# Patient Record
Sex: Female | Born: 1995 | Hispanic: Yes | Marital: Single | Smoking: Former smoker
Health system: Southern US, Community
[De-identification: ages and names within clinical notes are randomized; demographics above are authoritative.]

## PROBLEM LIST (undated history)

## (undated) ENCOUNTER — Inpatient Hospital Stay (HOSPITAL_COMMUNITY): Payer: Self-pay

## (undated) ENCOUNTER — Ambulatory Visit (HOSPITAL_COMMUNITY): Admission: EM | Payer: Self-pay

## (undated) DIAGNOSIS — Z789 Other specified health status: Secondary | ICD-10-CM

## (undated) HISTORY — DX: Other specified health status: Z78.9

## (undated) HISTORY — PX: APPENDECTOMY: SHX54

---
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
## 2015-01-17 ENCOUNTER — Emergency Department (HOSPITAL_COMMUNITY)
Admission: EM | Admit: 2015-01-17 | Discharge: 2015-01-17 | Disposition: A | Discharge status: Home / Self Care | Payer: Self-pay | Source: Home / Self Care | Attending: Family Medicine | Admitting: Family Medicine

## 2015-01-17 ENCOUNTER — Encounter (HOSPITAL_COMMUNITY): Payer: Self-pay | Admitting: Emergency Medicine

## 2015-01-17 DIAGNOSIS — N3001 Acute cystitis with hematuria: Secondary | ICD-10-CM

## 2015-01-17 DIAGNOSIS — R1030 Lower abdominal pain, unspecified: Secondary | ICD-10-CM

## 2015-01-17 DIAGNOSIS — R197 Diarrhea, unspecified: Secondary | ICD-10-CM

## 2015-01-17 DIAGNOSIS — R112 Nausea with vomiting, unspecified: Secondary | ICD-10-CM

## 2015-01-17 LAB — POCT PREGNANCY, URINE: PREG TEST UR: NEGATIVE

## 2015-01-17 LAB — POCT URINALYSIS DIP (DEVICE)
BILIRUBIN URINE: NEGATIVE
GLUCOSE, UA: NEGATIVE mg/dL
Ketones, ur: NEGATIVE mg/dL
Leukocytes, UA: NEGATIVE
NITRITE: POSITIVE — AB
PROTEIN: NEGATIVE mg/dL
Specific Gravity, Urine: 1.025 (ref 1.005–1.030)
Urobilinogen, UA: 0.2 mg/dL (ref 0.0–1.0)
pH: 6 (ref 5.0–8.0)

## 2015-01-17 MED ORDER — ONDANSETRON 4 MG PO TBDP
ORAL_TABLET | ORAL | Status: AC
  Filled 2015-01-17: qty 1

## 2015-01-17 MED ORDER — CEPHALEXIN 500 MG PO CAPS: 500.0000 mg | ORAL_CAPSULE | Freq: Three times a day (TID) | ORAL | Status: DC

## 2015-01-17 MED ORDER — PHENAZOPYRIDINE HCL 200 MG PO TABS: 200.0000 mg | ORAL_TABLET | Freq: Three times a day (TID) | ORAL | Status: DC

## 2015-01-17 MED ORDER — ONDANSETRON HCL 4 MG PO TABS: 4.0000 mg | ORAL_TABLET | Freq: Four times a day (QID) | ORAL | Status: DC

## 2015-01-17 MED ORDER — IBUPROFEN 800 MG PO TABS: 800.0000 mg | ORAL_TABLET | Freq: Three times a day (TID) | ORAL | Status: DC | PRN

## 2015-01-17 NOTE — Discharge Instructions (Signed)
Abdominal Pain, Women °Abdominal (stomach, pelvic, or belly) pain can be caused by many things. It is important to tell your doctor: °· The location of the pain. °· Does it come and go or is it present all the time? °· Are there things that start the pain (eating certain foods, exercise)? °· Are there other symptoms associated with the pain (fever, nausea, vomiting, diarrhea)? °All of this is helpful to know when trying to find the cause of the pain. °CAUSES  °· Stomach: virus or bacteria infection, or ulcer. °· Intestine: appendicitis (inflamed appendix), regional ileitis (Crohn's disease), ulcerative colitis (inflamed colon), irritable bowel syndrome, diverticulitis (inflamed diverticulum of the colon), or cancer of the stomach or intestine. °· Gallbladder disease or stones in the gallbladder. °· Kidney disease, kidney stones, or infection. °· Pancreas infection or cancer. °· Fibromyalgia (pain disorder). °· Diseases of the female organs: °· Uterus: fibroid (non-cancerous) tumors or infection. °· Fallopian tubes: infection or tubal pregnancy. °· Ovary: cysts or tumors. °· Pelvic adhesions (scar tissue). °· Endometriosis (uterus lining tissue growing in the pelvis and on the pelvic organs). °· Pelvic congestion syndrome (female organs filling up with blood just before the menstrual period). °· Pain with the menstrual period. °· Pain with ovulation (producing an egg). °· Pain with an IUD (intrauterine device, birth control) in the uterus. °· Cancer of the female organs. °· Functional pain (pain not caused by a disease, may improve without treatment). °· Psychological pain. °· Depression. °DIAGNOSIS  °Your doctor will decide the seriousness of your pain by doing an examination. °· Blood tests. °· X-rays. °· Ultrasound. °· CT scan (computed tomography, special type of X-ray). °· MRI (magnetic resonance imaging). °· Cultures, for infection. °· Barium enema (dye inserted in the large intestine, to better view it with  X-rays). °· Colonoscopy (looking in intestine with a lighted tube). °· Laparoscopy (minor surgery, looking in abdomen with a lighted tube). °· Major abdominal exploratory surgery (looking in abdomen with a large incision). °TREATMENT  °The treatment will depend on the cause of the pain.  °· Many cases can be observed and treated at home. °· Over-the-counter medicines recommended by your caregiver. °· Prescription medicine. °· Antibiotics, for infection. °· Birth control pills, for painful periods or for ovulation pain. °· Hormone treatment, for endometriosis. °· Nerve blocking injections. °· Physical therapy. °· Antidepressants. °· Counseling with a psychologist or psychiatrist. °· Minor or major surgery. °HOME CARE INSTRUCTIONS  °· Do not take laxatives, unless directed by your caregiver. °· Take over-the-counter pain medicine only if ordered by your caregiver. Do not take aspirin because it can cause an upset stomach or bleeding. °· Try a clear liquid diet (broth or water) as ordered by your caregiver. Slowly move to a bland diet, as tolerated, if the pain is related to the stomach or intestine. °· Have a thermometer and take your temperature several times a day, and record it. °· Bed rest and sleep, if it helps the pain. °· Avoid sexual intercourse, if it causes pain. °· Avoid stressful situations. °· Keep your follow-up appointments and tests, as your caregiver orders. °· If the pain does not go away with medicine or surgery, you may try: °¨ Acupuncture. °¨ Relaxation exercises (yoga, meditation). °¨ Group therapy. °¨ Counseling. °SEEK MEDICAL CARE IF:  °· You notice certain foods cause stomach pain. °· Your home care treatment is not helping your pain. °· You need stronger pain medicine. °· You want your IUD removed. °· You feel faint or   lightheaded. °· You develop nausea and vomiting. °· You develop a rash. °· You are having side effects or an allergy to your medicine. °SEEK IMMEDIATE MEDICAL CARE IF:  °· Your  pain does not go away or gets worse. °· You have a fever. °· Your pain is felt only in portions of the abdomen. The right side could possibly be appendicitis. The left lower portion of the abdomen could be colitis or diverticulitis. °· You are passing blood in your stools (bright red or black tarry stools, with or without vomiting). °· You have blood in your urine. °· You develop chills, with or without a fever. °· You pass out. °MAKE SURE YOU:  °· Understand these instructions. °· Will watch your condition. °· Will get help right away if you are not doing well or get worse. °Document Released: 08/23/2007 Document Revised: 03/12/2014 Document Reviewed: 09/12/2009 °ExitCare® Patient Information ©2015 [employer]. This information is not intended to replace advice given to you by your health care provider. Make sure you discuss any questions you have with your health care provider. ° °Urinary Tract Infection °Urinary tract infections (UTIs) can develop anywhere along your urinary tract. Your urinary tract is your body's drainage system for removing wastes and extra water. Your urinary tract includes two kidneys, two ureters, a bladder, and a urethra. Your kidneys are a pair of bean-shaped organs. Each kidney is about the size of your fist. They are located below your ribs, one on each side of your spine. °CAUSES °Infections are caused by microbes, which are microscopic organisms, including fungi, viruses, and bacteria. These organisms are so small that they can only be seen through a microscope. Bacteria are the microbes that most commonly cause UTIs. °SYMPTOMS  °Symptoms of UTIs may vary by age and gender of the patient and by the location of the infection. Symptoms in young women typically include a frequent and intense urge to urinate and a painful, burning feeling in the bladder or urethra during urination. Older women and men are more likely to be tired, shaky, and weak and have muscle aches and abdominal pain.  A fever may mean the infection is in your kidneys. Other symptoms of a kidney infection include pain in your back or sides below the ribs, nausea, and vomiting. °DIAGNOSIS °To diagnose a UTI, your caregiver will ask you about your symptoms. Your caregiver also will ask to provide a urine sample. The urine sample will be tested for bacteria and white blood cells. White blood cells are made by your body to help fight infection. °TREATMENT  °Typically, UTIs can be treated with medication. Because most UTIs are caused by a bacterial infection, they usually can be treated with the use of antibiotics. The choice of antibiotic and length of treatment depend on your symptoms and the type of bacteria causing your infection. °HOME CARE INSTRUCTIONS °· If you were prescribed antibiotics, take them exactly as your caregiver instructs you. Finish the medication even if you feel better after you have only taken some of the medication. °· Drink enough water and fluids to keep your urine clear or pale yellow. °· Avoid caffeine, tea, and carbonated beverages. They tend to irritate your bladder. °· Empty your bladder often. Avoid holding urine for long periods of time. °· Empty your bladder before and after sexual intercourse. °· After a bowel movement, women should cleanse from front to back. Use each tissue only once. °SEEK MEDICAL CARE IF:  °· You have back pain. °· You develop   a fever.  Your symptoms do not begin to resolve within 3 days. SEEK IMMEDIATE MEDICAL CARE IF:   You have severe back pain or lower abdominal pain.  You develop chills.  You have nausea or vomiting.  You have continued burning or discomfort with urination. MAKE SURE YOU:   Understand these instructions.  Will watch your condition.  Will get help right away if you are not doing well or get worse. Document Released: 08/05/2005 Document Revised: 04/26/2012 Document Reviewed: 12/04/2011 [facility] Patient Information 2015 [place], [place]. This  information is not intended to replace advice given to you by your health care provider. Make sure you discuss any questions you have with your health care provider.  Nausea and Vomiting Nausea means you feel sick to your stomach. Throwing up (vomiting) is a reflex where stomach contents come out of your mouth. HOME CARE   Take medicine as told by your doctor.  Do not force yourself to eat. However, you do need to drink fluids.  If you feel like eating, eat a normal diet as told by your doctor.  Eat rice, wheat, potatoes, bread, lean meats, yogurt, fruits, and vegetables.  Avoid high-fat foods.  Drink enough fluids to keep your pee (urine) clear or pale yellow.  Ask your doctor how to replace body fluid losses (rehydrate). Signs of body fluid loss (dehydration) include:  Feeling very thirsty.  Dry lips and mouth.  Feeling dizzy.  Dark pee.  Peeing less than normal.  Feeling confused.  Fast breathing or heart rate. GET HELP RIGHT AWAY IF:   You have blood in your throw up.  You have black or bloody poop (stool).  You have a bad headache or stiff neck.  You feel confused.  You have bad belly (abdominal) pain.  You have chest pain or trouble breathing.  You do not pee at least once every 8 hours.  You have cold, clammy skin.  You keep throwing up after 24 to 48 hours.  You have a fever. MAKE SURE YOU:   Understand these instructions.  Will watch your condition.  Will get help right away if you are not doing well or get worse. Document Released: 04/13/2008 Document Revised: 01/18/2012 Document Reviewed: 03/27/2011 [facility] Patient Information 2015 [place], [place]. This information is not intended to replace advice given to you by your health care provider. Make sure you discuss any questions you have with your health care provider.

## 2015-01-17 NOTE — ED Provider Notes (Signed)
CSN: [id number]     Arrival date & time 01/17/15  1119 History   First MD Initiated Contact with Patient 01/17/15 1247     No chief complaint on file.  (Consider location/radiation/quality/duration/timing/severity/associated sxs/prior Treatment) HPI     19 year old female presents complaining of abdominal pain and vomiting. This started on the first day of her period 5 days ago. She regularly has severe cramping with her periods but she was concerned at this time because she has been vomiting. She most recently vomited last night. It is nonbloody. She has had abdominal pain and vomiting with periods multiple times in the past but not much in the past year. The pain was 9 out of 10 in severity when it appeared first began but is now about 3-4 out of 10 in severity. Some diarrhea on the first 2 days of her period But that has resolved. Last normal bowel movement was a few days ago. Also she admits to having heavy periods that sometimes occur every 2 weeks  Past Medical History  Diagnosis Date  . Abdominal pain, recurrent    History reviewed. No pertinent past surgical history. No family history on file. History  Substance Use Topics  . Smoking status: Never Smoker   . Smokeless tobacco: Not on file  . Alcohol Use: No   OB History    No data available     Review of Systems  Constitutional: Negative for fever and chills.  Gastrointestinal: Positive for nausea, vomiting, abdominal pain and diarrhea. Negative for blood in stool.  Genitourinary: Negative for dysuria, urgency, frequency, flank pain, vaginal discharge, vaginal pain and pelvic pain.  All other systems reviewed and are negative.   Allergies  Review of patient's allergies indicates no known allergies.  Home Medications   Prior to Admission medications   Medication Sig Start Date End Date Taking? Authorizing Provider  cephALEXin (KEFLEX) 500 MG capsule Take 1 capsule (500 mg total) by mouth 3 (three) times daily. 01/17/15    Adrian Blackwater Rhilyn Battle, PA-C  clindamycin (CLEOCIN) 150 MG capsule Take 2 capsules (300 mg total) by mouth 3 (three) times daily. 11/28/13   Jerelyn Scott, MD  Docusate Sodium 100 MG capsule Take 1 tablet (100 mg total) by mouth 2 (two) times daily. 04/16/14   Reuben Likes, MD  hydrocortisone (ANUSOL-HC) 2.5 % rectal cream Place 1 application rectally 2 (two) times daily. 04/16/14   Reuben Likes, MD  ibuprofen (ADVIL,MOTRIN) 800 MG tablet Take 1 tablet (800 mg total) by mouth every 8 (eight) hours as needed for mild pain or moderate pain. 01/17/15   Adrian Blackwater Chasten Blaze, PA-C  ondansetron (ZOFRAN) 4 MG tablet Take 1 tablet (4 mg total) by mouth every 6 (six) hours. 01/17/15   Graylon Good, PA-C  phenazopyridine (PYRIDIUM) 200 MG tablet Take 1 tablet (200 mg total) by mouth 3 (three) times daily. 01/17/15   Adrian Blackwater Breon Diss, PA-C  polyethylene glycol powder (MIRALAX) powder Take 17 g by mouth daily. 04/16/14   Reuben Likes, MD   BP 117/59 mmHg  Pulse 77  Temp(Src) 99 F (37.2 C) (Oral)  Resp 14  SpO2 100% Physical Exam  Constitutional: She is oriented to person, place, and time. Vital signs are normal. She appears well-developed and well-nourished. No distress.  HENT:  Head: Normocephalic and atraumatic.  Cardiovascular: Normal rate, regular rhythm and normal heart sounds.   Pulmonary/Chest: Effort normal and breath sounds normal. No respiratory distress.  Abdominal: Soft. Normal appearance and bowel sounds  are normal. She exhibits no distension and no mass. There is tenderness in the right lower quadrant, suprapubic area and left lower quadrant. There is no rigidity, no rebound, no guarding, no CVA tenderness, no tenderness at McBurney's point and negative Murphy's sign.  Neurological: She is alert and oriented to person, place, and time. She has normal strength. Coordination normal.  Skin: Skin is warm and dry. No rash noted. She is not diaphoretic.  Psychiatric: She has a normal mood and affect. Judgment  normal.  Nursing note and vitals reviewed.   ED Course  Procedures (including critical care time) Labs Review Labs Reviewed  POCT URINALYSIS DIP (DEVICE) - Abnormal; Notable for the following:    Hgb urine dipstick MODERATE (*)    Nitrite POSITIVE (*)    All other components within normal limits  URINE CULTURE    Imaging Review No results found.   MDM   1. Acute cystitis with hematuria   2. Nausea vomiting and diarrhea   3. Abdominal pain, lower    Lower abdominal pain and tenderness. Urinalysis shows nitrites and moderate hemoglobin consistent with cystitis. Urine pregnancy test is negative. Treat with Keflex, Pyridium and ibuprofen for pain, Zofran for nausea. Follow-up with women's health outpatient clinic for evaluation of frequent heavy periods  Meds ordered this encounter  Medications  . ibuprofen (ADVIL,MOTRIN) 800 MG tablet    Sig: Take 1 tablet (800 mg total) by mouth every 8 (eight) hours as needed for mild pain or moderate pain.    Dispense:  30 tablet    Refill:  0    Order Specific Question:  Supervising Provider    Answer:  Rodolph BongOREY, EVAN S [id number]]  . cephALEXin (KEFLEX) 500 MG capsule    Sig: Take 1 capsule (500 mg total) by mouth 3 (three) times daily.    Dispense:  15 capsule    Refill:  0    Order Specific Question:  Supervising Provider    Answer:  Rodolph BongOREY, EVAN S [id number]]  . ondansetron (ZOFRAN) 4 MG tablet    Sig: Take 1 tablet (4 mg total) by mouth every 6 (six) hours.    Dispense:  12 tablet    Refill:  0    Order Specific Question:  Supervising Provider    Answer:  Rodolph BongOREY, EVAN S [id number]]  . phenazopyridine (PYRIDIUM) 200 MG tablet    Sig: Take 1 tablet (200 mg total) by mouth 3 (three) times daily.    Dispense:  6 tablet    Refill:  0    Order Specific Question:  Supervising Provider    Answer:  Rodolph BongCOREY, EVAN S [[id number]]     Graylon GoodZachary H Cassondra Stachowski, PA-C 01/17/15 1318

## 2015-01-19 LAB — URINE CULTURE: Colony Count: >=100000

## 2015-01-19 NOTE — ED Notes (Signed)
Urine culture: >100,000 colonies E. Coli.  Pt. adequately treated with Keflex. Monica [name] 01/19/2015  

## 2015-01-25 ENCOUNTER — Encounter (HOSPITAL_COMMUNITY): Payer: Self-pay

## 2015-01-25 ENCOUNTER — Emergency Department (HOSPITAL_COMMUNITY)
Admission: EM | Admit: 2015-01-25 | Discharge: 2015-01-25 | Disposition: A | Discharge status: Home / Self Care | Payer: Self-pay | Attending: Emergency Medicine | Admitting: Emergency Medicine

## 2015-01-25 DIAGNOSIS — R51 Headache: Secondary | ICD-10-CM | POA: Insufficient documentation

## 2015-01-25 DIAGNOSIS — Z792 Long term (current) use of antibiotics: Secondary | ICD-10-CM | POA: Insufficient documentation

## 2015-01-25 DIAGNOSIS — R519 Headache, unspecified: Secondary | ICD-10-CM

## 2015-01-25 DIAGNOSIS — Z7952 Long term (current) use of systemic steroids: Secondary | ICD-10-CM | POA: Insufficient documentation

## 2015-01-25 DIAGNOSIS — Z79899 Other long term (current) drug therapy: Secondary | ICD-10-CM | POA: Insufficient documentation

## 2015-01-25 MED ORDER — KETOROLAC TROMETHAMINE 30 MG/ML IJ SOLN
30.0000 mg | Freq: Once | INTRAMUSCULAR | Status: AC
  Administered 2015-01-25: 30 mg via INTRAMUSCULAR
  Filled 2015-01-25: qty 1

## 2015-01-25 MED ORDER — BUTALBITAL-APAP-CAFFEINE 50-325-40 MG PO TABS: 1.0000 | ORAL_TABLET | Freq: Four times a day (QID) | ORAL | Status: AC | PRN

## 2015-01-25 MED ORDER — METOCLOPRAMIDE HCL 5 MG/ML IJ SOLN
10.0000 mg | Freq: Once | INTRAMUSCULAR | Status: AC
  Administered 2015-01-25: 10 mg via INTRAMUSCULAR
  Filled 2015-01-25: qty 2

## 2015-01-25 MED ORDER — DIPHENHYDRAMINE HCL 50 MG/ML IJ SOLN
25.0000 mg | Freq: Once | INTRAMUSCULAR | Status: AC
  Administered 2015-01-25: 25 mg via INTRAMUSCULAR
  Filled 2015-01-25: qty 1

## 2015-01-25 NOTE — Discharge Instructions (Signed)
Please follow up with your primary care physician in 1-2 days. If you do not have one please call the [facility] number listed above. Please read all discharge instructions and return precautions.  ° °General Headache Without Cause °A headache is pain or discomfort felt around the head or neck area. The specific cause of a headache may not be found. There are many causes and types of headaches. A few common ones are: °· Tension headaches. °· Migraine headaches. °· Cluster headaches. °· Chronic daily headaches. °HOME CARE INSTRUCTIONS  °· Keep all follow-up appointments with your caregiver or any specialist referral. °· Only take over-the-counter or prescription medicines for pain or discomfort as directed by your caregiver. °· Lie down in a dark, quiet room when you have a headache. °· Keep a headache journal to find out what may trigger your migraine headaches. For example, write down: °¨ What you eat and drink. °¨ How much sleep you get. °¨ Any change to your diet or medicines. °· Try massage or other relaxation techniques. °· Put ice packs or heat on the head and neck. Use these 3 to 4 times per day for 15 to 20 minutes each time, or as needed. °· Limit stress. °· Sit up straight, and do not tense your muscles. °· Quit smoking if you smoke. °· Limit alcohol use. °· Decrease the amount of caffeine you drink, or stop drinking caffeine. °· Eat and sleep on a regular schedule. °· Get 7 to 9 hours of sleep, or as recommended by your caregiver. °· Keep lights dim if bright lights bother you and make your headaches worse. °SEEK MEDICAL CARE IF:  °· You have problems with the medicines you were prescribed. °· Your medicines are not working. °· You have a change from the usual headache. °· You have nausea or vomiting. °SEEK IMMEDIATE MEDICAL CARE IF:  °· Your headache becomes severe. °· You have a fever. °· You have a stiff neck. °· You have loss of vision. °· You have muscular weakness or loss of  muscle control. °· You start losing your balance or have trouble walking. °· You feel faint or pass out. °· You have severe symptoms that are different from your first symptoms. °MAKE SURE YOU:  °· Understand these instructions. °· Will watch your condition. °· Will get help right away if you are not doing well or get worse. °Document Released: 10/26/2005 Document Revised: 01/18/2012 Document Reviewed: 11/11/2011 °[employer]® Patient Information ©2015 [employer]. This information is not intended to replace advice given to you by your health care provider. Make sure you discuss any questions you have with your health care provider. ° °
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
## 2015-01-25 NOTE — ED Provider Notes (Signed)
CSN: [id number]     Arrival date & time 01/25/15  0259 History   First MD Initiated Contact with Patient 01/25/15 [id number]     Chief Complaint  Patient presents with  . Headache     (Consider location/radiation/quality/duration/timing/severity/associated sxs/prior Treatment) HPI Comments: Patient is an 19 year old female presenting to the emergency department for evaluation of a headache. She states she's had intermittent headaches over the last 4 days. She describes her headaches as waxing and waning in severity describes them as throbbing pounding in nature with associated phonophobia. She states she has tried Tylenol and ibuprofen with little to no improvement, stating "my headache discomforts back." Denies any associated fever, chills, nausea, vomiting, photophobia, rash.   Past Medical History  Diagnosis Date  . Abdominal pain, recurrent    History reviewed. No pertinent past surgical history. History reviewed. No pertinent family history. History  Substance Use Topics  . Smoking status: Never Smoker   . Smokeless tobacco: Not on file  . Alcohol Use: No   OB History    No data available     Review of Systems  Neurological: Positive for headaches.  All other systems reviewed and are negative.     Allergies  Review of patient's allergies indicates no known allergies.  Home Medications   Prior to Admission medications   Medication Sig Start Date End Date Taking? Authorizing Provider  ibuprofen (ADVIL,MOTRIN) 200 MG tablet Take 200 mg by mouth every 6 (six) hours as needed for moderate pain.   Yes Historical Provider, MD  butalbital-acetaminophen-caffeine (FIORICET) 50-325-40 MG per tablet Take 1 tablet by mouth every 6 (six) hours as needed for headache. 01/25/15 01/25/16  Francee Piccolo, PA-C  cephALEXin (KEFLEX) 500 MG capsule Take 1 capsule (500 mg total) by mouth 3 (three) times daily. Patient not taking: Reported on 01/25/2015 01/17/15   Graylon Good, PA-C   clindamycin (CLEOCIN) 150 MG capsule Take 2 capsules (300 mg total) by mouth 3 (three) times daily. Patient not taking: Reported on 01/25/2015 11/28/13   Jerelyn Scott, MD  Docusate Sodium 100 MG capsule Take 1 tablet (100 mg total) by mouth 2 (two) times daily. Patient not taking: Reported on 01/25/2015 04/16/14   Reuben Likes, MD  hydrocortisone (ANUSOL-HC) 2.5 % rectal cream Place 1 application rectally 2 (two) times daily. Patient not taking: Reported on 01/25/2015 04/16/14   Reuben Likes, MD  ibuprofen (ADVIL,MOTRIN) 800 MG tablet Take 1 tablet (800 mg total) by mouth every 8 (eight) hours as needed for mild pain or moderate pain. Patient not taking: Reported on 01/25/2015 01/17/15   Graylon Good, PA-C  ondansetron (ZOFRAN) 4 MG tablet Take 1 tablet (4 mg total) by mouth every 6 (six) hours. Patient not taking: Reported on 01/25/2015 01/17/15   Graylon Good, PA-C  phenazopyridine (PYRIDIUM) 200 MG tablet Take 1 tablet (200 mg total) by mouth 3 (three) times daily. Patient not taking: Reported on 01/25/2015 01/17/15   Graylon Good, PA-C  polyethylene glycol powder (MIRALAX) powder Take 17 g by mouth daily. Patient not taking: Reported on 01/25/2015 04/16/14   Reuben Likes, MD   BP 94/56 mmHg  Pulse 64  Temp(Src) 98.2 F (36.8 C) (Oral)  Resp 16  Ht  (1.626 m)  Wt 135 lb (61.236 kg)  BMI 23.16 kg/m2  SpO2 100% Physical Exam  Constitutional: She is oriented to person, place, and time. She appears well-developed and well-nourished. No distress.  HENT:  Head: Normocephalic and atraumatic.  Right Ear: External ear normal.  Left Ear: External ear normal.  Nose: Nose normal.  Mouth/Throat: Oropharynx is clear and moist. No oropharyngeal exudate.  Eyes: Conjunctivae and EOM are normal. Pupils are equal, round, and reactive to light.  Neck: Normal range of motion. Neck supple.  Cardiovascular: Normal rate, regular rhythm, normal heart sounds and intact distal pulses.    Pulmonary/Chest: Effort normal and breath sounds normal. No respiratory distress.  Abdominal: Soft. There is no tenderness.  Musculoskeletal: Normal range of motion.  Neurological: She is alert and oriented to person, place, and time. She has normal strength. No cranial nerve deficit. Gait normal. GCS eye subscore is 4. GCS verbal subscore is 5. GCS motor subscore is 6.  Sensation grossly intact.  No pronator drift.  Bilateral heel-knee-shin intact.  Skin: Skin is warm and dry. She is not diaphoretic.  Nursing note and vitals reviewed.   ED Course  Procedures (including critical care time) Medications  diphenhydrAMINE (BENADRYL) injection 25 mg (25 mg Intramuscular Given 01/25/15 0651)  metoCLOPramide (REGLAN) injection 10 mg (10 mg Intramuscular Given 01/25/15 0654)  ketorolac (TORADOL) 30 MG/ML injection 30 mg (30 mg Intramuscular Given 01/25/15 0827)    Labs Review Labs Reviewed - No data to display  Imaging Review No results found.   EKG Interpretation None      MDM   Final diagnoses:  Headache on top of head    Filed Vitals:   01/25/15 0900  BP: 94/56  Pulse: 64  Temp:   Resp:    Afebrile, NAD, non-toxic appearing, AAOx4.  Pt HA treated and improved while in ED.  Presentation is  non concerning for [facility], ICH, Meningitis, or temporal arteritis. Pt is afebrile with no focal neuro deficits, nuchal rigidity, or change in vision. Pt is to follow up with PCP to discuss prophylactic medication. Pt verbalizes understanding and is agreeable with plan to dc. Patient is stable at time of discharge.     Francee PiccoloJennifer Manolito Jurewicz, PA-C 01/25/15 1000  Vanetta MuldersScott Zackowski, MD 01/26/15 [phone]

## 2015-11-10 NOTE — L&D Delivery Note (Signed)
Delivery Note Atter a 30 minute 2nd stage, at 9:30 PM a viable female was delivered via Vaginal, Spontaneous Delivery (Presentation:OA ;restituted to ROA  ).  APGAR: 8, 9; weight pending .  After 1 minute, the cord was clamped and cut. 40 units of pitocin diluted in 1000cc LR was infused rapidly IV.  The placenta separated spontaneously and delivered via CCT and maternal pushing effort.  It was inspected and appears to be intact with a 3 VC.   Placenta status: intact , delivered via Tomasa BlaseSchultz.  Cord:three vessels  with the following complications:noen .  Cord pH: N/A  Anesthesia:  none Episiotomy: None Lacerations: 2nd degree;Periurethral Suture Repair: 2.0 vicryl Est. Blood Loss (mL): 100  Mom to postpartum.  Baby to Couplet care / Skin to Skin.  Breast No Circ MOC: undecided  Clayton BiblesSamantha Weinhold, SNM 10/22/2016, 9:54 PM  I was present for the above and agree CRESENZO-DISHMAN,Evvie Behrmann

## 2016-02-23 ENCOUNTER — Emergency Department (HOSPITAL_COMMUNITY)
Admission: EM | Admit: 2016-02-23 | Discharge: 2016-02-24 | Disposition: A | Discharge status: Home / Self Care | Payer: Self-pay | Attending: Emergency Medicine | Admitting: Emergency Medicine

## 2016-02-23 ENCOUNTER — Emergency Department (HOSPITAL_COMMUNITY): Payer: Self-pay

## 2016-02-23 ENCOUNTER — Encounter (HOSPITAL_COMMUNITY): Payer: Self-pay

## 2016-02-23 DIAGNOSIS — Z8719 Personal history of other diseases of the digestive system: Secondary | ICD-10-CM | POA: Insufficient documentation

## 2016-02-23 DIAGNOSIS — N831 Corpus luteum cyst of ovary, unspecified side: Secondary | ICD-10-CM

## 2016-02-23 DIAGNOSIS — N8311 Corpus luteum cyst of right ovary: Secondary | ICD-10-CM | POA: Insufficient documentation

## 2016-02-23 DIAGNOSIS — Z3202 Encounter for pregnancy test, result negative: Secondary | ICD-10-CM | POA: Insufficient documentation

## 2016-02-23 DIAGNOSIS — R197 Diarrhea, unspecified: Secondary | ICD-10-CM | POA: Insufficient documentation

## 2016-02-23 DIAGNOSIS — N390 Urinary tract infection, site not specified: Secondary | ICD-10-CM

## 2016-02-23 LAB — CBC
HCT: 36.1 % (ref 36.0–46.0)
Hemoglobin: 11.7 g/dL — ABNORMAL LOW (ref 12.0–15.0)
MCH: 26.2 pg (ref 26.0–34.0)
MCHC: 32.4 g/dL (ref 30.0–36.0)
MCV: 80.8 fL (ref 78.0–100.0)
Platelets: 312 10*3/uL (ref 150–400)
RBC: 4.47 MIL/uL (ref 3.87–5.11)
RDW: 14.2 % (ref 11.5–15.5)
WBC: 8.8 10*3/uL (ref 4.0–10.5)

## 2016-02-23 LAB — URINALYSIS, ROUTINE W REFLEX MICROSCOPIC
BILIRUBIN URINE: NEGATIVE
Glucose, UA: NEGATIVE mg/dL
Hgb urine dipstick: NEGATIVE
KETONES UR: NEGATIVE mg/dL
NITRITE: NEGATIVE
PROTEIN: NEGATIVE mg/dL
Specific Gravity, Urine: 1.02 (ref 1.005–1.030)
pH: 7.5 (ref 5.0–8.0)

## 2016-02-23 LAB — COMPREHENSIVE METABOLIC PANEL
ALT: 14 U/L (ref 14–54)
AST: 20 U/L (ref 15–41)
Albumin: 4.2 g/dL (ref 3.5–5.0)
Alkaline Phosphatase: 67 U/L (ref 38–126)
Anion gap: 11 (ref 5–15)
BILIRUBIN TOTAL: 0.5 mg/dL (ref 0.3–1.2)
BUN: 11 mg/dL (ref 6–20)
CHLORIDE: 105 mmol/L (ref 101–111)
CO2: 23 mmol/L (ref 22–32)
CREATININE: 0.64 mg/dL (ref 0.44–1.00)
Calcium: 9.5 mg/dL (ref 8.9–10.3)
GFR calc Af Amer: >60 mL/min (ref >60)
GLUCOSE: 94 mg/dL (ref 65–99)
Potassium: 3.6 mmol/L (ref 3.5–5.1)
Sodium: 139 mmol/L (ref 135–145)
TOTAL PROTEIN: 7.5 g/dL (ref 6.5–8.1)

## 2016-02-23 LAB — URINE MICROSCOPIC-ADD ON

## 2016-02-23 LAB — LIPASE, BLOOD: Lipase: 29 U/L (ref 11–51)

## 2016-02-23 LAB — POC URINE PREG, ED: PREG TEST UR: NEGATIVE

## 2016-02-23 MED ORDER — SODIUM CHLORIDE 0.9 % IV BOLUS (SEPSIS)
1000.0000 mL | Freq: Once | INTRAVENOUS | Status: AC
  Administered 2016-02-23: 1000 mL via INTRAVENOUS

## 2016-02-23 MED ORDER — FENTANYL CITRATE (PF) 100 MCG/2ML IJ SOLN
50.0000 ug | INTRAMUSCULAR | Status: DC | PRN
  Administered 2016-02-23: 50 ug via INTRAVENOUS
  Filled 2016-02-23: qty 2

## 2016-02-23 MED ORDER — MORPHINE SULFATE (PF) 4 MG/ML IV SOLN
4.0000 mg | Freq: Once | INTRAVENOUS | Status: AC
  Administered 2016-02-24: 4 mg via INTRAVENOUS
  Filled 2016-02-23: qty 1

## 2016-02-23 MED ORDER — ONDANSETRON 4 MG PO TBDP
ORAL_TABLET | ORAL | Status: AC
  Filled 2016-02-23: qty 1

## 2016-02-23 MED ORDER — ONDANSETRON 4 MG PO TBDP
4.0000 mg | ORAL_TABLET | Freq: Once | ORAL | Status: AC | PRN
  Administered 2016-02-23: 4 mg via ORAL

## 2016-02-23 NOTE — ED Notes (Signed)
Pt complaining of R side abdominal pain and nausea/vomiting x 3 days. Pt complaining of fever and chills.

## 2016-02-23 NOTE — ED Notes (Signed)
Pt c/o R sided abdominal and R sided low back pain onset 3 days ago. Pain started as intermittent and has progressively gotten worse today.  Reports NVD as well

## 2016-02-23 NOTE — ED Provider Notes (Signed)
CSN: [id number]     Arrival date & time 02/23/16  2052 History   First MD Initiated Contact with Patient 02/23/16 2123     Chief Complaint  Patient presents with  . Abdominal Pain     (Consider location/radiation/quality/duration/timing/severity/associated sxs/prior Treatment) Patient is a 20 y.o. female presenting with abdominal pain.  Abdominal Pain Pain location:  RLQ and RUQ Pain quality: aching and gnawing   Pain radiates to:  Does not radiate Pain severity:  Moderate Onset quality:  Gradual Timing:  Constant Progression:  Worsening Chronicity:  New Context: not sick contacts   Relieved by:  Nothing Worsened by:  Movement and palpation Ineffective treatments:  None tried Associated symptoms: chills, diarrhea, fatigue, nausea and vomiting   Associated symptoms: no chest pain, no cough, no fever, no shortness of breath, no vaginal bleeding and no vaginal discharge     Past Medical History  Diagnosis Date  . Abdominal pain, recurrent    History reviewed. No pertinent past surgical history. History reviewed. No pertinent family history. Social History  Substance Use Topics  . Smoking status: Never Smoker   . Smokeless tobacco: None  . Alcohol Use: No   OB History    No data available     Review of Systems  Constitutional: Positive for chills and fatigue. Negative for fever.  HENT: Negative for congestion and rhinorrhea.   Eyes: Negative for visual disturbance.  Respiratory: Negative for cough, shortness of breath and wheezing.   Cardiovascular: Negative for chest pain and leg swelling.  Gastrointestinal: Positive for nausea, vomiting, abdominal pain and diarrhea.  Genitourinary: Negative for vaginal bleeding and vaginal discharge.  Musculoskeletal: Negative for neck pain and neck stiffness.  Skin: Negative for rash.  Allergic/Immunologic: Negative for immunocompromised state.  Neurological: Negative for syncope, weakness and headaches.      Allergies   Review of patient's allergies indicates no known allergies.  Home Medications   Prior to Admission medications   Medication Sig Start Date End Date Taking? Authorizing Provider  acetaminophen (TYLENOL) 500 MG tablet Take 500-1,000 mg by mouth every 6 (six) hours as needed (pain).   Yes Historical Provider, MD  ibuprofen (ADVIL,MOTRIN) 200 MG tablet Take 400 mg by mouth every 6 (six) hours as needed (pain).    Yes Historical Provider, MD  Docusate Sodium 100 MG capsule Take 1 tablet (100 mg total) by mouth 2 (two) times daily. Patient not taking: Reported on 01/25/2015 04/16/14   Reuben Likes, MD  hydrocortisone (ANUSOL-HC) 2.5 % rectal cream Place 1 application rectally 2 (two) times daily. Patient not taking: Reported on 01/25/2015 04/16/14   Reuben Likes, MD  ibuprofen (ADVIL,MOTRIN) 800 MG tablet Take 1 tablet (800 mg total) by mouth every 8 (eight) hours as needed for mild pain or moderate pain. Patient not taking: Reported on 01/25/2015 01/17/15   Graylon Good, PA-C  ondansetron (ZOFRAN) 4 MG tablet Take 1 tablet (4 mg total) by mouth every 6 (six) hours. Patient not taking: Reported on 01/25/2015 01/17/15   Graylon Good, PA-C  polyethylene glycol powder (MIRALAX) powder Take 17 g by mouth daily. Patient not taking: Reported on 01/25/2015 04/16/14   Reuben Likes, MD   BP 116/76 mmHg  Pulse 87  Temp(Src) 98.4 F (36.9 C) (Oral)  Resp 16  SpO2 100%  LMP 02/05/2016 (Approximate) Physical Exam  Constitutional: She is oriented to person, place, and time. She appears well-developed and well-nourished. No distress.  HENT:  Head: Normocephalic.  Mouth/Throat: No  oropharyngeal exudate.  Eyes: Pupils are equal, round, and reactive to light.  Neck: Normal range of motion. Neck supple.  Cardiovascular: Normal rate, regular rhythm, normal heart sounds and intact distal pulses.  Exam reveals no friction rub.   No murmur heard. Pulmonary/Chest: Effort normal and breath sounds normal. No  respiratory distress. She has no wheezes. She has no rales.  Abdominal: Soft. Bowel sounds are normal. She exhibits no distension. There is tenderness (Moderate tenderness to palpation in the right upper quadrant and right CVA. Positive Murphy's although this is difficult to assess as patient is diffusely tender. Mild tenderness in the right lower quadrant as well. No guarding or rebound.). There is no rebound and no guarding.  Musculoskeletal: She exhibits no edema.  Neurological: She is alert and oriented to person, place, and time.  Skin: Skin is warm. No rash noted.  Nursing note and vitals reviewed.   ED Course  Procedures (including critical care time) Labs Review Labs Reviewed  CBC - Abnormal; Notable for the following:    Hemoglobin 11.7 (*)    All other components within normal limits  URINALYSIS, ROUTINE W REFLEX MICROSCOPIC (NOT AT [facility]) - Abnormal; Notable for the following:    APPearance TURBID (*)    Leukocytes, UA SMALL (*)    All other components within normal limits  URINE MICROSCOPIC-ADD ON - Abnormal; Notable for the following:    Squamous Epithelial / LPF 6-30 (*)    Bacteria, UA FEW (*)    All other components within normal limits  URINE CULTURE  LIPASE, BLOOD  COMPREHENSIVE METABOLIC PANEL  POC URINE PREG, ED    Imaging Review [place] Abdomen Complete  02/24/2016  CLINICAL DATA:  Right upper quadrant pain and flank pain for 3 days. EXAM: ABDOMEN ULTRASOUND COMPLETE COMPARISON:  CT abdomen and pelvis 12/29/2011. FINDINGS: Gallbladder: No gallstones or wall thickening visualized. No sonographic Murphy sign noted by sonographer. Common bile duct: Diameter: 3.1 mm, normal Liver: No focal lesion identified. Within normal limits in parenchymal echogenicity. IVC: No abnormality visualized. Pancreas: Visualized portion unremarkable. Spleen: Size and appearance within normal limits. Right Kidney: Length: 10.2 cm. Echogenicity within normal limits. No mass or hydronephrosis  visualized. Left Kidney: Length: 10.5 cm. Echogenicity within normal limits. No mass or hydronephrosis visualized. Abdominal aorta: No aneurysm visualized. Other findings: None. IMPRESSION: No acute abnormalities demonstrated. Electronically Signed   By: Burman NievesWilliam  Stevens M.D.   On: 02/24/2016 00:26   I have personally reviewed and evaluated these images and lab results as part of my medical decision-making.   EKG Interpretation None      MDM  20 year old female with past medical history of chronic constipation who presents with a several-day history of right-sided abdominal pain. See history of present illness above. On arrival the patient is afebrile and HDS. Examination is as above. Patient's presentation and exam is most concerning for possible acute cholecystitis, with additional consideration of possible pyelonephritis, appendicitis, or viral enteritis given associated fever vomiting and diarrhea. Patient reports poor by mouth intake so wll place IV and start IV fluids of plan for ultrasound given age and most likely diagnosis of cholecystitis. Otherwise, the patient is afebrile, not tachycardic, and shows no signs of systemic illness or sepsis. She denies any vaginal bleeding discharge or other symptoms to suggest PID or vaginitis. Denies any cough and lungs are clear bilaterally with no signs of pneumonia.  Labs and imaging reviewed as above. CBC shows no leukocytosis. CMP shows normal renal function as well as  LFTs. Lipase is normal. Pregnancy is negative. Ultrasound shows no acute abnormality. Will subsequently obtain CT abdomen and pelvis to evaluate for possible appendicitis. Patient is in agreement with this plan. She remains HDS.   CT of the abdomen and pelvis shows degenerating right corpus luteum cyst which is likely etiology for the patient's pain. Urinalysis also positive for white blood cells but there is no evidence of pyelonephritis on CT patient is otherwise afebrile,  well-appearing, and in no acute distress. Regarding her copious luteum cyst, she has constant, gradual pain and her pain is not consistent with ovarian torsion. We'll give NSAIDs, Keflex for UTI, and advised outpatient follow-up. We discussed ovarian torsion precautions in detail and the patient is in agreement with this plan  Clinical Impression: 1. Corpus luteum cyst   2. Urinary tract infection without hematuria, site unspecified     Disposition: Discharge  Condition: Good  I have discussed the results, Dx and Tx plan with the pt(& family if present). He/she/they expressed understanding and agree(s) with the plan. Discharge instructions discussed at great length. Strict return precautions discussed and pt &/or family have verbalized understanding of the instructions. No further questions at time of discharge.    New Prescriptions   CEPHALEXIN (KEFLEX) 500 MG CAPSULE    Take 1 capsule (500 mg total) by mouth 3 (three) times daily.   DOCUSATE SODIUM (COLACE) 100 MG CAPSULE    Take 1 capsule (100 mg total) by mouth every 12 (twelve) hours.   IBUPROFEN (ADVIL,MOTRIN) 600 MG TABLET    Take 1 tablet (600 mg total) by mouth every 8 (eight) hours as needed for moderate pain.    Follow Up: [facility] [address] [place] [address] [phone]  Follow-up with a doctor in 3-5 days as needed. If you need a doctor, you can call this number to set up an appointment with one.   Pt seen in conjunction with Dr. Harvel Ricks, MD 02/24/16 8295  Gerhard Munch, MD 02/24/16 [phone]

## 2016-02-24 ENCOUNTER — Emergency Department (HOSPITAL_COMMUNITY): Payer: Self-pay

## 2016-02-24 ENCOUNTER — Encounter (HOSPITAL_COMMUNITY): Payer: Self-pay | Admitting: Radiology

## 2016-02-24 MED ORDER — DOCUSATE SODIUM 100 MG PO CAPS: 100.0000 mg | ORAL_CAPSULE | Freq: Two times a day (BID) | ORAL | Status: DC

## 2016-02-24 MED ORDER — IOPAMIDOL (ISOVUE-300) INJECTION 61%
INTRAVENOUS | Status: AC
  Administered 2016-02-24: 100 mL
  Filled 2016-02-24: qty 100

## 2016-02-24 MED ORDER — IBUPROFEN 600 MG PO TABS: 600.0000 mg | ORAL_TABLET | Freq: Three times a day (TID) | ORAL | Status: DC | PRN

## 2016-02-24 MED ORDER — CEPHALEXIN 250 MG PO CAPS
500.0000 mg | ORAL_CAPSULE | Freq: Once | ORAL | Status: AC
  Administered 2016-02-24: 500 mg via ORAL
  Filled 2016-02-24: qty 2

## 2016-02-24 MED ORDER — CEPHALEXIN 500 MG PO CAPS: 500.0000 mg | ORAL_CAPSULE | Freq: Three times a day (TID) | ORAL | Status: DC

## 2016-02-24 MED ORDER — KETOROLAC TROMETHAMINE 15 MG/ML IJ SOLN
15.0000 mg | Freq: Once | INTRAMUSCULAR | Status: AC
  Administered 2016-02-24: 15 mg via INTRAVENOUS
  Filled 2016-02-24: qty 1

## 2016-02-26 LAB — URINE CULTURE: Culture: >=100000 — AB

## 2016-02-27 ENCOUNTER — Telehealth: Payer: Self-pay | Admitting: *Deleted

## 2016-02-27 NOTE — Progress Notes (Signed)
ED Antimicrobial Stewardship Positive Culture Follow Up   Monica [name] is an 20 y.o. female who [facility] with a chief complaint of  Chief Complaint  Patient presents with  . Abdominal Pain    Recent Results (from the past 720 hour(s))  Urine culture     Status: Abnormal   Collection Time: 02/23/16  9:08 PM  Result Value Ref Range Status   Specimen Description URINE, CLEAN CATCH  Final   Special Requests ADDED 0148 02/24/16  Final   Culture >=100,000 COLONIES/mL ENTEROCOCCUS SPECIES (A)  Final   Report Status 02/26/2016 FINAL  Final   Organism ID, Bacteria ENTEROCOCCUS SPECIES (A)  Final      Susceptibility   Enterococcus species - MIC*    AMPICILLIN <=2 SENSITIVE Sensitive     LEVOFLOXACIN 1 SENSITIVE Sensitive     NITROFURANTOIN <=16 SENSITIVE Sensitive     VANCOMYCIN 2 SENSITIVE Sensitive     * >=100,000 COLONIES/mL ENTEROCOCCUS SPECIES    [x]  Treated with cephalexin, organism resistant to prescribed antimicrobial []  Patient discharged originally without antimicrobial agent and treatment is now indicated  Patient has no signs or symptoms of UTI, most likely constipation. CT showed corpus luteal cyst within right ovary. Should follow up with PCP/OBGYN. No further treatment indicated.  ED Provider: Burna FortsJeff Hedges, PA-C   Renata CapriceMeredith Ameisha Mcclellan 02/27/2016, 9:19 AM PharmD Candidate

## 2016-02-27 NOTE — ED Notes (Signed)
Post ED Visit - Positive Culture Follow-up  Culture report reviewed by antimicrobial stewardship pharmacist:  []  Enzo BiNathan Batchelder, Pharm.D. []  Celedonio MiyamotoJeremy Frens, Pharm.D., BCPS []  Garvin FilaMike Maccia, Pharm.D. []  Georgina PillionElizabeth Martin, Pharm.D., BCPS []  KeithsburgMinh Pham, [address] BoulevardPharm.D., BCPS, AAHIVP [x]  Estella HuskMichelle Turner, Pharm.D., BCPS, AAHIVP []  Tennis Mustassie Stewart, Pharm.D. []  Sherle Poeob Vincent, [place]D.  Positive urine culture Treated with Cephalexin, organism sensitive to the same and no further patient follow-up is required at this time.  Monica AxeRobertson, [name] 02/27/2016, 9:55 AM

## 2016-03-15 ENCOUNTER — Emergency Department (HOSPITAL_COMMUNITY): Payer: Self-pay

## 2016-03-15 ENCOUNTER — Encounter (HOSPITAL_COMMUNITY): Payer: Self-pay | Admitting: Emergency Medicine

## 2016-03-15 ENCOUNTER — Emergency Department (HOSPITAL_COMMUNITY)
Admission: EM | Admit: 2016-03-15 | Discharge: 2016-03-16 | Disposition: A | Discharge status: Home / Self Care | Payer: Self-pay | Attending: Emergency Medicine | Admitting: Emergency Medicine

## 2016-03-15 DIAGNOSIS — N760 Acute vaginitis: Secondary | ICD-10-CM

## 2016-03-15 DIAGNOSIS — R102 Pelvic and perineal pain: Secondary | ICD-10-CM

## 2016-03-15 DIAGNOSIS — O98811 Other maternal infectious and parasitic diseases complicating pregnancy, first trimester: Secondary | ICD-10-CM | POA: Insufficient documentation

## 2016-03-15 DIAGNOSIS — O26899 Other specified pregnancy related conditions, unspecified trimester: Secondary | ICD-10-CM

## 2016-03-15 DIAGNOSIS — Z3490 Encounter for supervision of normal pregnancy, unspecified, unspecified trimester: Secondary | ICD-10-CM

## 2016-03-15 DIAGNOSIS — B9689 Other specified bacterial agents as the cause of diseases classified elsewhere: Secondary | ICD-10-CM

## 2016-03-15 DIAGNOSIS — Z3A01 Less than 8 weeks gestation of pregnancy: Secondary | ICD-10-CM | POA: Insufficient documentation

## 2016-03-15 DIAGNOSIS — O23591 Infection of other part of genital tract in pregnancy, first trimester: Secondary | ICD-10-CM | POA: Insufficient documentation

## 2016-03-15 DIAGNOSIS — B379 Candidiasis, unspecified: Secondary | ICD-10-CM | POA: Insufficient documentation

## 2016-03-15 LAB — CBC
HCT: 31.9 % — ABNORMAL LOW (ref 36.0–46.0)
HEMOGLOBIN: 10.6 g/dL — AB (ref 12.0–15.0)
MCH: 26.8 pg (ref 26.0–34.0)
MCHC: 33.2 g/dL (ref 30.0–36.0)
MCV: 80.8 fL (ref 78.0–100.0)
Platelets: 264 10*3/uL (ref 150–400)
RBC: 3.95 MIL/uL (ref 3.87–5.11)
RDW: 14.2 % (ref 11.5–15.5)
WBC: 9.6 10*3/uL (ref 4.0–10.5)

## 2016-03-15 LAB — COMPREHENSIVE METABOLIC PANEL
ALBUMIN: 3.8 g/dL (ref 3.5–5.0)
ALK PHOS: 53 U/L (ref 38–126)
ALT: 13 U/L — ABNORMAL LOW (ref 14–54)
ANION GAP: 8 (ref 5–15)
AST: 17 U/L (ref 15–41)
BILIRUBIN TOTAL: 0.4 mg/dL (ref 0.3–1.2)
BUN: 12 mg/dL (ref 6–20)
CALCIUM: 9.2 mg/dL (ref 8.9–10.3)
CO2: 23 mmol/L (ref 22–32)
Chloride: 104 mmol/L (ref 101–111)
Creatinine, Ser: 0.76 mg/dL (ref 0.44–1.00)
GFR calc Af Amer: >60 mL/min (ref >60)
GFR calc non Af Amer: >60 mL/min (ref >60)
GLUCOSE: 72 mg/dL (ref 65–99)
Potassium: 3.4 mmol/L — ABNORMAL LOW (ref 3.5–5.1)
Sodium: 135 mmol/L (ref 135–145)
TOTAL PROTEIN: 6.6 g/dL (ref 6.5–8.1)

## 2016-03-15 LAB — URINALYSIS, ROUTINE W REFLEX MICROSCOPIC
BILIRUBIN URINE: NEGATIVE
Glucose, UA: NEGATIVE mg/dL
KETONES UR: NEGATIVE mg/dL
NITRITE: NEGATIVE
PROTEIN: NEGATIVE mg/dL
SPECIFIC GRAVITY, URINE: 1.027 (ref 1.005–1.030)
pH: 6 (ref 5.0–8.0)

## 2016-03-15 LAB — URINE MICROSCOPIC-ADD ON

## 2016-03-15 LAB — LIPASE, BLOOD: Lipase: 31 U/L (ref 11–51)

## 2016-03-15 LAB — POC URINE PREG, ED: PREG TEST UR: POSITIVE — AB

## 2016-03-15 NOTE — ED Provider Notes (Signed)
CSN: [id number]     Arrival date & time 03/15/16  2219 History  By signing my name below, I, Freida Busman, attest that this documentation has been prepared under the direction and in the presence of Melene Plan, DO . Electronically Signed: Freida Busman, Scribe. 03/15/2016. 11:29 PM.     Chief Complaint  Patient presents with  . Abdominal Pain  . Headache    The history is provided by the patient. No language interpreter was used.     HPI Comments:  Monica [name] is a 20 y.o. female who presents to the Emergency Department complaining of sharp, stabbing, intermittent lower abdominal pain x 5 days.  She reports associated dysuria, frequent urination, and vaginal discharge. She denies fever, chills, vomiting, diarrhea and vaginal bleeding. Her LNMP was Mar 30th, 2017. No alleviating factors noted.   Past Medical History  Diagnosis Date  . Abdominal pain, recurrent    History reviewed. No pertinent past surgical history. No family history on file. Social History  Substance Use Topics  . Smoking status: Never Smoker   . Smokeless tobacco: None  . Alcohol Use: No   OB History    No data available     Review of Systems  Constitutional: Negative for fever and chills.  HENT: Negative for congestion and rhinorrhea.   Eyes: Negative for redness and visual disturbance.  Respiratory: Negative for shortness of breath and wheezing.   Cardiovascular: Negative for chest pain and palpitations.  Gastrointestinal: Positive for abdominal pain. Negative for nausea, vomiting and diarrhea.  Genitourinary: Positive for dysuria, frequency and vaginal discharge. Negative for urgency and vaginal bleeding.  Musculoskeletal: Negative for myalgias and arthralgias.  Skin: Negative for pallor and wound.  Neurological: Negative for dizziness and headaches.  All other systems reviewed and are negative.  Allergies  Review of patient's allergies indicates no known allergies.  Home Medications    Prior to Admission medications   Medication Sig Start Date End Date Taking? Authorizing Provider  cephALEXin (KEFLEX) 500 MG capsule Take 1 capsule (500 mg total) by mouth 3 (three) times daily. Patient not taking: Reported on 03/15/2016 02/24/16   Shaune Pollack, MD  docusate sodium (COLACE) 100 MG capsule Take 1 capsule (100 mg total) by mouth every 12 (twelve) hours. Patient not taking: Reported on 03/15/2016 02/24/16   Shaune Pollack, MD  hydrocortisone (ANUSOL-HC) 2.5 % rectal cream Place 1 application rectally 2 (two) times daily. Patient not taking: Reported on 01/25/2015 04/16/14   Reuben Likes, MD  ibuprofen (ADVIL,MOTRIN) 600 MG tablet Take 1 tablet (600 mg total) by mouth every 8 (eight) hours as needed for moderate pain. Patient not taking: Reported on 03/15/2016 02/24/16   Shaune Pollack, MD  polyethylene glycol powder [facility]) powder Take 17 g by mouth daily. Patient not taking: Reported on 01/25/2015 04/16/14   Reuben Likes, MD   BP 117/74 mmHg  Pulse 78  Temp(Src) 99 F (37.2 C) (Oral)  Resp 16  Ht  (1.651 m)  Wt 128 lb 6 oz (58.231 kg)  BMI 21.36 kg/m2  SpO2 100%  LMP 02/05/2016 (Approximate) Physical Exam  Constitutional: She is oriented to person, place, and time. She appears well-developed and well-nourished. No distress.  HENT:  Head: Normocephalic and atraumatic.  Eyes: EOM are normal. Pupils are equal, round, and reactive to light.  Neck: Normal range of motion. Neck supple.  Cardiovascular: Normal rate and regular rhythm.  Exam reveals no gallop and no friction rub.   No murmur heard. Pulmonary/Chest:  Effort normal. She has no wheezes. She has no rales.  Abdominal: Soft. She exhibits no distension. There is tenderness in the suprapubic area.  Mild suprapubic tenderness  Genitourinary: Rectal exam shows no mass and no tenderness. Cervix exhibits discharge (whitish thick). Cervix exhibits no motion tenderness. Right adnexum displays no mass and no tenderness.  Left adnexum displays no mass and no tenderness.    Chaperone was present for exam which was performed with no discomfort or complications.   Musculoskeletal: She exhibits no edema or tenderness.  Neurological: She is alert and oriented to person, place, and time.  Skin: Skin is warm and dry. She is not diaphoretic.  Psychiatric: She has a normal mood and affect. Her behavior is normal.  Nursing note and vitals reviewed.   ED Course  Procedures  DIAGNOSTIC STUDIES:  Oxygen Saturation is 100% on RA, normal by my interpretation.    COORDINATION OF CARE:  11:27 PM Discussed treatment plan with pt at bedside and pt agreed to plan.  EMERGENCY DEPARTMENT [place] PREGNANCY "Study: Limited Ultrasound of the Pelvis"  INDICATIONS:Pregnancy(required) and Pelvic pain Multiple views of the uterus and pelvic cavity are obtained with a multi-frequency probe.  APPROACH:Transabdominal   PERFORMED BY: Myself  IMAGES ARCHIVED?: Yes  LIMITATIONS: Decompressed bladder  PREGNANCY FREE FLUID: Small  PREGNANCY UTERUS FINDINGS:Uterus normal size and Gestational sac noted ADNEXAL FINDINGS:Right ovarion cyst and Left ovary not seen  PREGNANCY FINDINGS: Intrauterine gestational sac noted, No fetal heart activity seen and No yolk sac noted  INTERPRETATION: No visualized intrauterine pregnancy  GESTATIONAL AGE, ESTIMATE: 5wk 5 days    Labs Review Labs Reviewed  CBC - Abnormal; Notable for the following:    Hemoglobin 10.6 (*)    HCT 31.9 (*)    All other components within normal limits  URINALYSIS, ROUTINE W REFLEX MICROSCOPIC (NOT AT [facility]) - Abnormal; Notable for the following:    Hgb urine dipstick TRACE (*)    Leukocytes, UA MODERATE (*)    All other components within normal limits  URINE MICROSCOPIC-ADD ON - Abnormal; Notable for the following:    Squamous Epithelial / LPF 6-30 (*)    Bacteria, UA FEW (*)    All other components within normal limits  POC URINE PREG, ED - Abnormal;  Notable for the following:    Preg Test, Ur POSITIVE (*)    All other components within normal limits  WET PREP, GENITAL  LIPASE, BLOOD  COMPREHENSIVE METABOLIC PANEL  HIV ANTIBODY (ROUTINE TESTING)  GC/CHLAMYDIA PROBE AMP ([place]) NOT AT [facility]    Imaging Review No results found. I have personally reviewed and evaluated these images and lab results as part of my medical decision-making.   EKG Interpretation None      MDM   Final diagnoses:  None    20 yo F With a chief complaint of lower abdominal pain. This been going on for about a week. Patient is having some pelvic cramping. Some whitish discharge and some itching as well. Patient was found to be pregnant on urine pregnancy test. At that ultrasound with the estimated 5 week 5 day gestation however no yolk sac and question right adnexal cyst. Sent for formal confirms my ultrasound. Patient with BV as well as vaginal candidiasis. Will treat. Women's follow-up.  I personally performed the services described in this documentation, which was scribed in my presence. The recorded information has been reviewed and is accurate.   1:59 AM:  I have discussed the diagnosis/risks/treatment options with the  patient and family and believe the pt to be eligible for discharge home to follow-up with OB. We also discussed returning to the ED immediately if new or worsening sx occur. We discussed the sx which are most concerning (e.g., sudden worsening pain, fever, inability to tolerate by mouth) that necessitate immediate return. Medications administered to the patient during their visit and any new prescriptions provided to the patient are listed below.  Medications given during this visit Medications - No data to display  Discharge Medication List as of 03/16/2016  1:40 AM    START taking these medications   Details  clotrimazole (GYNE-LOTRIMIN) 1 % vaginal cream Place 1 Applicatorful vaginally at bedtime., Starting 03/16/2016, Until  Discontinued, Print    metroNIDAZOLE (FLAGYL) 500 MG tablet Take 1 tablet (500 mg total) by mouth 2 (two) times daily., Starting 03/16/2016, Until Discontinued, Print        The patient appears reasonably screen and/or stabilized for discharge and I doubt any other medical condition or other [facility] requiring further screening, evaluation, or treatment in the ED at this time prior to discharge.     Melene Planan Jonnie Truxillo, DO 03/16/16 0200

## 2016-03-15 NOTE — ED Notes (Addendum)
C/o lower abd cramping x 5 days and intermittent headache x 3 days.  Reports she had the abd pain 2 weeks ago and it went away and started again.  Denies nausea/vomiting.  Also reports burning with urination and white vaginal discharge. LMP 3/29

## 2016-03-16 LAB — WET PREP, GENITAL
SPERM: NONE SEEN
TRICH WET PREP: NONE SEEN

## 2016-03-16 LAB — HIV ANTIBODY (ROUTINE TESTING W REFLEX): HIV Screen 4th Generation wRfx: NONREACTIVE

## 2016-03-16 LAB — GC/CHLAMYDIA PROBE AMP (~~LOC~~) NOT AT ARMC
Chlamydia: NEGATIVE
Neisseria Gonorrhea: NEGATIVE

## 2016-03-16 MED ORDER — METRONIDAZOLE 500 MG PO TABS: 500.0000 mg | ORAL_TABLET | Freq: Two times a day (BID) | ORAL | Status: DC

## 2016-03-16 MED ORDER — CLOTRIMAZOLE 1 % VA CREA: 1.0000 | TOPICAL_CREAM | Freq: Every day | VAGINAL | Status: DC

## 2016-03-16 NOTE — Discharge Instructions (Signed)
You need to follow-up in 2 weeks for repeat ultrasound. [facility], or the health department for follow-up. Bacterial Vaginosis Bacterial vaginosis is a vaginal infection that occurs when the normal balance of bacteria in the vagina is disrupted. It results from an overgrowth of certain bacteria. This is the most common vaginal infection in women of childbearing age. Treatment is important to prevent complications, especially in pregnant women, as it can cause a premature delivery. CAUSES  Bacterial vaginosis is caused by an increase in harmful bacteria that are normally present in smaller amounts in the vagina. Several different kinds of bacteria can cause bacterial vaginosis. However, the reason that the condition develops is not fully understood. RISK FACTORS Certain activities or behaviors can put you at an increased risk of developing bacterial vaginosis, including:  Having a new sex partner or multiple sex partners.  Douching.  Using an intrauterine device (IUD) for contraception. Women do not get bacterial vaginosis from toilet seats, bedding, swimming pools, or contact with objects around them. SIGNS AND SYMPTOMS  Some women with bacterial vaginosis have no signs or symptoms. Common symptoms include:  Grey vaginal discharge.  A fishlike odor with discharge, especially after sexual intercourse.  Itching or burning of the vagina and vulva.  Burning or pain with urination. DIAGNOSIS  Your health care provider will take a medical history and examine the vagina for signs of bacterial vaginosis. A sample of vaginal fluid may be taken. Your health care provider will look at this sample under a microscope to check for bacteria and abnormal cells. A vaginal pH test may also be done.  TREATMENT  Bacterial vaginosis may be treated with antibiotic medicines. These may be given in the form of a pill or a vaginal cream. A second round of antibiotics may be prescribed if the  condition comes back after treatment. Because bacterial vaginosis increases your risk for sexually transmitted diseases, getting treated can help reduce your risk for chlamydia, gonorrhea, HIV, and herpes. HOME CARE INSTRUCTIONS   Only take over-the-counter or prescription medicines as directed by your health care provider.  If antibiotic medicine was prescribed, take it as directed. Make sure you finish it even if you start to feel better.  Tell all sexual partners that you have a vaginal infection. They should see their health care provider and be treated if they have problems, such as a mild rash or itching.  During treatment, it is important that you follow these instructions:  Avoid sexual activity or use condoms correctly.  Do not douche.  Avoid alcohol as directed by your health care provider.  Avoid breastfeeding as directed by your health care provider. SEEK MEDICAL CARE IF:   Your symptoms are not improving after 3 days of treatment.  You have increased discharge or pain.  You have a fever. MAKE SURE YOU:   Understand these instructions.  Will watch your condition.  Will get help right away if you are not doing well or get worse. FOR MORE INFORMATION  Centers for Disease Control and Prevention, Division of STD Prevention: SolutionApps.co.zawww.cdc.gov/std American Sexual Health Association (ASHA): www.ashastd.org    This information is not intended to replace advice given to you by your health care provider. Make sure you discuss any questions you have with your health care provider.   Document Released: 10/26/2005 Document Revised: 11/16/2014 Document Reviewed: 06/07/2013 Elsevier Interactive Patient Education [employer]2016 Elsevier Inc.

## 2016-05-07 ENCOUNTER — Encounter: Payer: Self-pay | Admitting: Family

## 2016-05-07 ENCOUNTER — Ambulatory Visit (INDEPENDENT_AMBULATORY_CARE_PROVIDER_SITE_OTHER): Payer: Self-pay | Admitting: Family

## 2016-05-07 VITALS — BP 120/54 | HR 70 | Wt 124.9 lb

## 2016-05-07 DIAGNOSIS — Z3402 Encounter for supervision of normal first pregnancy, second trimester: Secondary | ICD-10-CM

## 2016-05-07 DIAGNOSIS — Z3482 Encounter for supervision of other normal pregnancy, second trimester: Secondary | ICD-10-CM

## 2016-05-07 DIAGNOSIS — Z113 Encounter for screening for infections with a predominantly sexual mode of transmission: Secondary | ICD-10-CM

## 2016-05-07 DIAGNOSIS — Z3490 Encounter for supervision of normal pregnancy, unspecified, unspecified trimester: Secondary | ICD-10-CM | POA: Insufficient documentation

## 2016-05-07 DIAGNOSIS — Z3492 Encounter for supervision of normal pregnancy, unspecified, second trimester: Secondary | ICD-10-CM

## 2016-05-07 LAB — POCT URINALYSIS DIP (DEVICE)
Bilirubin Urine: NEGATIVE
GLUCOSE, UA: NEGATIVE mg/dL
HGB URINE DIPSTICK: NEGATIVE
Ketones, ur: 40 mg/dL — AB
NITRITE: NEGATIVE
Protein, ur: NEGATIVE mg/dL
Specific Gravity, Urine: 1.02 (ref 1.005–1.030)
Urobilinogen, UA: 0.2 mg/dL (ref 0.0–1.0)
pH: 7 (ref 5.0–8.0)

## 2016-05-07 NOTE — Progress Notes (Signed)
Urine: small amt wbcs, 40 ketones Initial prenatal info packet given

## 2016-05-07 NOTE — Progress Notes (Signed)
   Subjective:    Monica [name] is a G1P0 5846w1d being seen today for her first obstetrical visit.  Her obstetrical history is significant for primigravida. Patient does intend to breast feed. Pregnancy history fully reviewed.  Patient reports nausea, no bleeding, no contractions, no cramping and vomiting.  Filed Vitals:   05/07/16 1316  BP: 120/54  Pulse: 70  Weight: 124 lb 14.4 oz (56.654 kg)    HISTORY: OB History  Gravida Para Term Preterm AB SAB TAB Ectopic Multiple Living  1             # Outcome Date GA Lbr Len/2nd Weight Sex Delivery Anes PTL Lv  1 Current              Past Medical History  Diagnosis Date  . Abdominal pain, recurrent   . Medical history non-contributory    Past Surgical History  Procedure Laterality Date  . No past surgeries     History reviewed. No pertinent family history.   Exam   BP 120/54 mmHg  Pulse 70  Wt 124 lb 14.4 oz (56.654 kg)  LMP 02/05/2016 (Approximate) Uterine Size: size equals dates  Pelvic Exam:    Perineum: No Hemorrhoids, Normal Perineum   Vulva: normal   Vagina:  normal mucosa, normal discharge, no palpable nodules   pH: Not done   Cervix: no bleeding following Pap, no cervical motion tenderness and no lesions   Adnexa: normal adnexa and no mass, fullness, tenderness   Bony Pelvis: Adequate  System: Breast:  No nipple retraction or dimpling, No nipple discharge or bleeding, No axillary or supraclavicular adenopathy, Normal to palpation without dominant masses   Skin: normal coloration and turgor, no rashes    Neurologic: negative   Extremities: normal strength, tone, and muscle mass   HEENT neck supple with midline trachea and thyroid without masses   Mouth/Teeth mucous membranes moist, pharynx normal without lesions   Neck supple and no masses   Cardiovascular: regular rate and rhythm, no murmurs or gallops   Respiratory:  appears well, vitals normal, no respiratory distress, acyanotic, normal RR, neck  free of mass or lymphadenopathy, chest clear, no wheezing, crepitations, rhonchi, normal symmetric air entry   Abdomen: soft, non-tender; bowel sounds normal; no masses,  no organomegaly   Urinary: urethral meatus normal       Assessment:    Pregnancy: G1P0 Patient Active Problem List   Diagnosis Date Noted  . Supervision of normal pregnancy, antepartum 05/07/2016  . Irregular menses 01/12/2012  . Simple constipation 01/12/2012  . Lower abdominal pain         Plan:     Initial labs drawn. Prenatal vitamins. Problem list reviewed and updated. Genetic Screening discussed Quad Screen: will obtain at next visit.. Follow up in 4 weeks.  Marlis EdelsonKARIM, [name] N 05/07/2016

## 2016-05-07 NOTE — Patient Instructions (Signed)

## 2016-05-08 LAB — PRENATAL PROFILE (SOLSTAS)
ANTIBODY SCREEN: NEGATIVE
BASOS ABS: 0 {cells}/uL (ref 0–200)
Basophils Relative: 0 %
EOS ABS: 194 {cells}/uL (ref 15–500)
Eosinophils Relative: 2 %
HEMATOCRIT: 35.4 % (ref 35.0–45.0)
HEMOGLOBIN: 11.5 g/dL — AB (ref 11.7–15.5)
HIV 1&2 Ab, 4th Generation: NONREACTIVE
Hepatitis B Surface Ag: NEGATIVE
Lymphocytes Relative: 23 %
Lymphs Abs: 2231 cells/uL (ref 850–3900)
MCH: 27.2 pg (ref 27.0–33.0)
MCHC: 32.5 g/dL (ref 32.0–36.0)
MCV: 83.7 fL (ref 80.0–100.0)
MONOS PCT: 4 %
MPV: 9.4 fL (ref 7.5–12.5)
Monocytes Absolute: 388 cells/uL (ref 200–950)
NEUTROS ABS: 6887 {cells}/uL (ref 1500–7800)
Neutrophils Relative %: 71 %
PLATELETS: 278 10*3/uL (ref 140–400)
RBC: 4.23 MIL/uL (ref 3.80–5.10)
RDW: 15.2 % — ABNORMAL HIGH (ref 11.0–15.0)
RH TYPE: POSITIVE
RUBELLA: 2.46 {index} — AB (ref <0.90)
WBC: 9.7 10*3/uL (ref 3.8–10.8)

## 2016-05-08 LAB — PAIN MGMT, PROFILE 6 CONF W/O MM, U
6 Acetylmorphine: NEGATIVE ng/mL (ref <10)
AMPHETAMINES: NEGATIVE ng/mL (ref <500)
Alcohol Metabolites: NEGATIVE ng/mL (ref <500)
BARBITURATES: NEGATIVE ng/mL (ref <300)
BENZODIAZEPINES: NEGATIVE ng/mL (ref <100)
CREATININE: 102.4 mg/dL (ref >=20.0)
Cocaine Metabolite: NEGATIVE ng/mL (ref <150)
MARIJUANA METABOLITE: NEGATIVE ng/mL (ref <20)
Methadone Metabolite: NEGATIVE ng/mL (ref <100)
OPIATES: NEGATIVE ng/mL (ref <100)
OXIDANT: NEGATIVE ug/mL (ref <200)
OXYCODONE: NEGATIVE ng/mL (ref <100)
PH: 7.18 (ref 4.5–9.0)
Phencyclidine: NEGATIVE ng/mL (ref <25)
Please note:: 0

## 2016-05-08 LAB — GC/CHLAMYDIA PROBE AMP (~~LOC~~) NOT AT ARMC
Chlamydia: NEGATIVE
Neisseria Gonorrhea: NEGATIVE

## 2016-05-09 LAB — CULTURE, OB URINE

## 2016-05-11 LAB — HEMOGLOBINOPATHY EVALUATION
HCT: 35.4 % (ref 35.0–45.0)
HEMOGLOBIN: 11.5 g/dL — AB (ref 11.7–15.5)
HGB A2 QUANT: 2.5 % (ref 1.8–3.5)
Hgb A: 96.5 % (ref >96.0)
Hgb F Quant: <1 % (ref <2.0)
MCH: 27.2 pg (ref 27.0–33.0)
MCV: 83.7 fL (ref 80.0–100.0)
RBC: 4.23 MIL/uL (ref 3.80–5.10)
RDW: 15.2 % — AB (ref 11.0–15.0)

## 2016-05-13 ENCOUNTER — Telehealth: Payer: Self-pay | Admitting: General Practice

## 2016-05-13 ENCOUNTER — Other Ambulatory Visit: Payer: Self-pay | Admitting: Family

## 2016-05-13 DIAGNOSIS — O2340 Unspecified infection of urinary tract in pregnancy, unspecified trimester: Secondary | ICD-10-CM | POA: Insufficient documentation

## 2016-05-13 DIAGNOSIS — O2342 Unspecified infection of urinary tract in pregnancy, second trimester: Secondary | ICD-10-CM

## 2016-05-13 MED ORDER — NITROFURANTOIN MONOHYD MACRO 100 MG PO CAPS: 100.0000 mg | ORAL_CAPSULE | Freq: Two times a day (BID) | ORAL | Status: DC

## 2016-05-13 NOTE — Telephone Encounter (Signed)
Per RJJOACZWalidah, patient has UTI and antibiotic has been sent to pharmacy. Called patient & informed her of results & medication sent to pharmacy. Patient verbalized understanding & had no questions

## 2016-05-24 ENCOUNTER — Emergency Department (HOSPITAL_COMMUNITY)
Admission: EM | Admit: 2016-05-24 | Discharge: 2016-05-24 | Disposition: A | Discharge status: Home / Self Care | Payer: Self-pay | Attending: Emergency Medicine | Admitting: Emergency Medicine

## 2016-05-24 ENCOUNTER — Encounter (HOSPITAL_COMMUNITY): Payer: Self-pay | Admitting: Emergency Medicine

## 2016-05-24 ENCOUNTER — Emergency Department (HOSPITAL_COMMUNITY): Payer: Self-pay

## 2016-05-24 DIAGNOSIS — Z79899 Other long term (current) drug therapy: Secondary | ICD-10-CM | POA: Insufficient documentation

## 2016-05-24 DIAGNOSIS — Z87891 Personal history of nicotine dependence: Secondary | ICD-10-CM | POA: Insufficient documentation

## 2016-05-24 DIAGNOSIS — O9989 Other specified diseases and conditions complicating pregnancy, childbirth and the puerperium: Secondary | ICD-10-CM | POA: Insufficient documentation

## 2016-05-24 DIAGNOSIS — R079 Chest pain, unspecified: Secondary | ICD-10-CM

## 2016-05-24 DIAGNOSIS — Z3A16 16 weeks gestation of pregnancy: Secondary | ICD-10-CM | POA: Insufficient documentation

## 2016-05-24 LAB — CBC WITH DIFFERENTIAL/PLATELET
BASOS ABS: 0 10*3/uL (ref 0.0–0.1)
BASOS PCT: 0 %
Eosinophils Absolute: 0.2 10*3/uL (ref 0.0–0.7)
Eosinophils Relative: 2 %
HEMATOCRIT: 31.6 % — AB (ref 36.0–46.0)
Hemoglobin: 10.6 g/dL — ABNORMAL LOW (ref 12.0–15.0)
LYMPHS PCT: 34 %
Lymphs Abs: 3.3 10*3/uL (ref 0.7–4.0)
MCH: 27.2 pg (ref 26.0–34.0)
MCHC: 33.5 g/dL (ref 30.0–36.0)
MCV: 81 fL (ref 78.0–100.0)
Monocytes Absolute: 0.5 10*3/uL (ref 0.1–1.0)
Monocytes Relative: 5 %
NEUTROS ABS: 5.6 10*3/uL (ref 1.7–7.7)
NEUTROS PCT: 59 %
Platelets: 261 10*3/uL (ref 150–400)
RBC: 3.9 MIL/uL (ref 3.87–5.11)
RDW: 13.1 % (ref 11.5–15.5)
WBC: 9.7 10*3/uL (ref 4.0–10.5)

## 2016-05-24 LAB — COMPREHENSIVE METABOLIC PANEL
ALT: 14 U/L (ref 14–54)
AST: 18 U/L (ref 15–41)
Albumin: 3 g/dL — ABNORMAL LOW (ref 3.5–5.0)
Alkaline Phosphatase: 58 U/L (ref 38–126)
Anion gap: 4 — ABNORMAL LOW (ref 5–15)
BUN: 8 mg/dL (ref 6–20)
CALCIUM: 9 mg/dL (ref 8.9–10.3)
CO2: 23 mmol/L (ref 22–32)
CREATININE: 0.56 mg/dL (ref 0.44–1.00)
Chloride: 107 mmol/L (ref 101–111)
GFR calc Af Amer: >60 mL/min (ref >60)
GLUCOSE: 83 mg/dL (ref 65–99)
Potassium: 3.6 mmol/L (ref 3.5–5.1)
Sodium: 134 mmol/L — ABNORMAL LOW (ref 135–145)
Total Bilirubin: 0.2 mg/dL — ABNORMAL LOW (ref 0.3–1.2)
Total Protein: 6.3 g/dL — ABNORMAL LOW (ref 6.5–8.1)

## 2016-05-24 LAB — I-STAT TROPONIN, ED: TROPONIN I, POC: 0 ng/mL (ref 0.00–0.08)

## 2016-05-24 MED ORDER — ACETAMINOPHEN 500 MG PO TABS
1000.0000 mg | ORAL_TABLET | Freq: Once | ORAL | Status: AC
  Administered 2016-05-24: 1000 mg via ORAL
  Filled 2016-05-24: qty 2

## 2016-05-24 NOTE — Discharge Instructions (Signed)
Nonspecific Chest Pain  °Chest pain can be caused by many different conditions. There is always a chance that your pain could be related to something serious, such as a heart attack or a blood clot in your lungs. Chest pain can also be caused by conditions that are not life-threatening. If you have chest pain, it is very important to follow up with your health care provider. °CAUSES  °Chest pain can be caused by: °· Heartburn. °· Pneumonia or bronchitis. °· Anxiety or stress. °· Inflammation around your heart (pericarditis) or lung (pleuritis or pleurisy). °· A blood clot in your lung. °· A collapsed lung (pneumothorax). It can develop suddenly on its own (spontaneous pneumothorax) or from trauma to the chest. °· Shingles infection (varicella-zoster virus). °· Heart attack. °· Damage to the bones, muscles, and cartilage that make up your chest wall. This can include: °¨ Bruised bones due to injury. °¨ Strained muscles or cartilage due to frequent or repeated coughing or overwork. °¨ Fracture to one or more ribs. °¨ Sore cartilage due to inflammation (costochondritis). °RISK FACTORS  °Risk factors for chest pain may include: °· Activities that increase your risk for trauma or injury to your chest. °· Respiratory infections or conditions that cause frequent coughing. °· Medical conditions or overeating that can cause heartburn. °· Heart disease or family history of heart disease. °· Conditions or health behaviors that increase your risk of developing a blood clot. °· Having had chicken pox (varicella zoster). °SIGNS AND SYMPTOMS °Chest pain can feel like: °· Burning or tingling on the surface of your chest or deep in your chest. °· Crushing, pressure, aching, or squeezing pain. °· Dull or sharp pain that is worse when you move, cough, or take a deep breath. °· Pain that is also felt in your back, neck, shoulder, or arm, or pain that spreads to any of these areas. °Your chest pain may come and go, or it may stay  constant. °DIAGNOSIS °Lab tests or other studies may be needed to find the cause of your pain. Your health care provider may have you take a test called an ambulatory ECG (electrocardiogram). An ECG records your heartbeat patterns at the time the test is performed. You may also have other tests, such as: °· Transthoracic echocardiogram (TTE). During echocardiography, sound waves are used to create a picture of all of the heart structures and to look at how blood flows through your heart. °· Transesophageal echocardiogram (TEE). This is a more advanced imaging test that obtains images from inside your body. It allows your health care provider to see your heart in finer detail. °· Cardiac monitoring. This allows your health care provider to monitor your heart rate and rhythm in real time. °· Holter monitor. This is a portable device that records your heartbeat and can help to diagnose abnormal heartbeats. It allows your health care provider to track your heart activity for several days, if needed. °· Stress tests. These can be done through exercise or by taking medicine that makes your heart beat more quickly. °· Blood tests. °· Imaging tests. °TREATMENT  °Your treatment depends on what is causing your chest pain. Treatment may include: °· Medicines. These may include: °¨ Acid blockers for heartburn. °¨ Anti-inflammatory medicine. °¨ Pain medicine for inflammatory conditions. °¨ Antibiotic medicine, if an infection is present. °¨ Medicines to dissolve blood clots. °¨ Medicines to treat coronary artery disease. °· Supportive care for conditions that do not require medicines. This may include: °¨ Resting. °¨ Applying heat   or cold packs to injured areas. °¨ Limiting activities until pain decreases. °HOME CARE INSTRUCTIONS °· If you were prescribed an antibiotic medicine, finish it all even if you start to feel better. °· Avoid any activities that bring on chest pain. °· Do not use any tobacco products, including  cigarettes, chewing tobacco, or electronic cigarettes. If you need help quitting, ask your health care provider. °· Do not drink alcohol. °· Take medicines only as directed by your health care provider. °· Keep all follow-up visits as directed by your health care provider. This is important. This includes any further testing if your chest pain does not go away. °· If heartburn is the cause for your chest pain, you may be told to keep your head raised (elevated) while sleeping. This reduces the chance that acid will go from your stomach into your esophagus. °· Make lifestyle changes as directed by your health care provider. These may include: °¨ Getting regular exercise. Ask your health care provider to suggest some activities that are safe for you. °¨ Eating a heart-healthy diet. A registered dietitian can help you to learn healthy eating options. °¨ Maintaining a healthy weight. °¨ Managing diabetes, if necessary. °¨ Reducing stress. °SEEK MEDICAL CARE IF: °· Your chest pain does not go away after treatment. °· You have a rash with blisters on your chest. °· You have a fever. °SEEK IMMEDIATE MEDICAL CARE IF:  °· Your chest pain is worse. °· You have an increasing cough, or you cough up blood. °· You have severe abdominal pain. °· You have severe weakness. °· You faint. °· You have chills. °· You have sudden, unexplained chest discomfort. °· You have sudden, unexplained discomfort in your arms, back, neck, or jaw. °· You have shortness of breath at any time. °· You suddenly start to sweat, or your skin gets clammy. °· You feel nauseous or you vomit. °· You suddenly feel light-headed or dizzy. °· Your heart begins to beat quickly, or it feels like it is skipping beats. °These symptoms may represent a serious problem that is an emergency. Do not wait to see if the symptoms will go away. Get medical help right away. Call your local emergency services (911 in the [place] Do not drive yourself to the hospital. °  °This  information is not intended to replace advice given to you by your health care provider. Make sure you discuss any questions you have with your health care provider. °  °Document Released: 08/05/2005 Document Revised: 11/16/2014 Document Reviewed: 06/01/2014 °Elsevier Interactive Patient Education ©2016 [employer] ° °

## 2016-05-24 NOTE — ED Notes (Signed)
Pt ambulated without difficulty approximately 20 steps; normal gait noted. Pt denied dizziness, weakness, lightheadedness.   Initial HR and spO2: 64 bpm, 100% Final HR and spO2: 72 bpm, 98%  HR never exceeded 76 bpm, spO2 never dropped below 98%

## 2016-05-24 NOTE — ED Provider Notes (Signed)
CSN: [id number]     Arrival date & time 05/24/16  0059 History  By signing my name below, I, Sonum Patel, attest that this documentation has been prepared under the direction and in the presence of Rolan Bucco, MD. Electronically Signed: Sonum Patel, Scribe. 05/24/2016. 1:48 AM.    Chief Complaint  Patient presents with  . Chest Pain    The history is provided by the patient. No language interpreter was used.     HPI Comments: Monica [name] is a G1P0 [redacted]w[redacted]d 20 y.o. female who presents to the Emergency Department complaining of SOB that began about 10 hours ago. Patient states she feels short of breath with movement and at rest. She also complains of left sided, non radiating, chest pain that is worse with movement and unchanged with deep breaths. She describes her pain as a pressure-like sensation and reports soreness with applied pressure the affected area. She denies leg pain or swelling, rhinorrhea, congestion, fever, cough, nausea, vomiting, dizziness.   Past Medical History  Diagnosis Date  . Abdominal pain, recurrent   . Medical history non-contributory    Past Surgical History  Procedure Laterality Date  . No past surgeries     No family history on file. Social History  Substance Use Topics  . Smoking status: Former Smoker    Quit date: 01/07/2016  . Smokeless tobacco: Never Used  . Alcohol Use: No   OB History    Gravida Para Term Preterm AB TAB SAB Ectopic Multiple Living   1              Review of Systems  Constitutional: Negative for fever, chills, diaphoresis and fatigue.  HENT: Negative for congestion, rhinorrhea and sneezing.   Eyes: Negative.   Respiratory: Positive for shortness of breath. Negative for cough and chest tightness.   Cardiovascular: Positive for chest pain. Negative for leg swelling.  Gastrointestinal: Negative for nausea, vomiting, abdominal pain, diarrhea and blood in stool.  Genitourinary: Negative for frequency, hematuria, flank  pain and difficulty urinating.  Musculoskeletal: Negative for back pain and arthralgias.  Skin: Negative for rash.  Neurological: Negative for dizziness, speech difficulty, weakness, numbness and headaches.      Allergies  Review of patient's allergies indicates no known allergies.  Home Medications   Prior to Admission medications   Medication Sig Start Date End Date Taking? Authorizing Provider  cephALEXin (KEFLEX) 500 MG capsule Take 1 capsule (500 mg total) by mouth 3 (three) times daily. Patient not taking: Reported on 03/15/2016 02/24/16   Shaune Pollack, MD  clotrimazole (GYNE-LOTRIMIN) 1 % vaginal cream Place 1 Applicatorful vaginally at bedtime. Patient not taking: Reported on 05/07/2016 03/16/16   Melene Plan, DO  docusate sodium (COLACE) 100 MG capsule Take 1 capsule (100 mg total) by mouth every 12 (twelve) hours. Patient not taking: Reported on 03/15/2016 02/24/16   Shaune Pollack, MD  hydrocortisone (ANUSOL-HC) 2.5 % rectal cream Place 1 application rectally 2 (two) times daily. Patient not taking: Reported on 01/25/2015 04/16/14   Reuben Likes, MD  ibuprofen (ADVIL,MOTRIN) 600 MG tablet Take 1 tablet (600 mg total) by mouth every 8 (eight) hours as needed for moderate pain. Patient not taking: Reported on 03/15/2016 02/24/16   Shaune Pollack, MD  metroNIDAZOLE (FLAGYL) 500 MG tablet Take 1 tablet (500 mg total) by mouth 2 (two) times daily. Patient not taking: Reported on 05/07/2016 03/16/16   Melene Plan, DO  nitrofurantoin, macrocrystal-monohydrate, (MACROBID) 100 MG capsule Take 1 capsule (100 mg total) by  mouth 2 (two) times daily. Patient not taking: Reported on 05/24/2016 05/13/16   Marlis Edelson, CNM  polyethylene glycol powder (MIRALAX) powder Take 17 g by mouth daily. Patient not taking: Reported on 01/25/2015 04/16/14   Reuben Likes, MD   BP 102/55 mmHg  Pulse 63  Temp(Src) 97.7 F (36.5 C) (Oral)  Resp 14  Ht 5\' 5"  (1.651 m)  Wt 124 lb (56.246 kg)  BMI 20.63 kg/m2  SpO2  99%  LMP 02/05/2016 (Approximate) Physical Exam  Constitutional: She is oriented to person, place, and time. She appears well-developed and well-nourished.  HENT:  Head: Normocephalic and atraumatic.  Eyes: Pupils are equal, round, and reactive to light.  Neck: Normal range of motion. Neck supple.  Cardiovascular: Normal rate, regular rhythm and normal heart sounds.   Pulmonary/Chest: Effort normal and breath sounds normal. No respiratory distress. She has no wheezes. She has no rales. She exhibits tenderness (tenderness to palpation to the left chest wall).  Abdominal: Soft. Bowel sounds are normal. There is no tenderness. There is no rebound and no guarding.  Musculoskeletal: Normal range of motion. She exhibits no edema or tenderness.  No edema or calf tenderness   Lymphadenopathy:    She has no cervical adenopathy.  Neurological: She is alert and oriented to person, place, and time.  Skin: Skin is warm and dry. No rash noted.  Psychiatric: She has a normal mood and affect.  Nursing note and vitals reviewed.   ED Course  Procedures (including critical care time)  DIAGNOSTIC STUDIES: Oxygen Saturation is 100% on RA, normal by my interpretation.    COORDINATION OF CARE: 1:48 AM Will order CXR and lab work. Discussed treatment plan with pt at bedside and pt agreed to plan.   Labs Review Labs Reviewed  CBC WITH DIFFERENTIAL/PLATELET - Abnormal; Notable for the following:    Hemoglobin 10.6 (*)    HCT 31.6 (*)    All other components within normal limits  COMPREHENSIVE METABOLIC PANEL - Abnormal; Notable for the following:    Sodium 134 (*)    Total Protein 6.3 (*)    Albumin 3.0 (*)    Total Bilirubin 0.2 (*)    Anion gap 4 (*)    All other components within normal limits  Rosezena Sensor, ED    Imaging Review Dg Chest 2 View  05/24/2016  CLINICAL DATA:  20 year old female with chest tightness EXAM: CHEST  2 VIEW COMPARISON:  None. FINDINGS: The heart size and  mediastinal contours are within normal limits. Both lungs are clear. The visualized skeletal structures are unremarkable. IMPRESSION: No active cardiopulmonary disease. Electronically Signed   By: Elgie Collard M.D.   On: 05/24/2016 02:27   I have personally reviewed and evaluated these images and lab results as part of my medical decision-making.   EKG Interpretation   Date/Time:  Sunday May 24 2016 01:06:53 EDT Ventricular Rate:  68 PR Interval:  110 QRS Duration: 70 QT Interval:  380 QTC Calculation: 404 R Axis:   83 Text Interpretation:  Sinus rhythm with sinus arrhythmia with short PR  Otherwise normal ECG No old tracing to compare Confirmed by Taffany Heiser  MD,  Adel Neyer ([id number]) on 05/24/2016 7:25:58 AM      MDM   Final diagnoses:  Chest pain, unspecified chest pain type    Patient presents with left-sided chest pain. It's reproducible on palpation. She has no hypoxia. She ambulated without hypoxia. There is no tachycardia. No other suggestions of pulmonary embolus. I  discussed with her having a chest x-ray given that she's pregnant. She did have her belly shielded that was otherwise amenable to the x-ray. This was negative. No evidence of pneumonia or pneumothorax. Her chest pain is reproducible and I Jamesetta Sohyllis is likely musculoskeletal in nature. She was discharged home in good condition. She is advised to use Tylenol for symptomatic relief and follow-up with her OB/GYN who is the women's outpatient clinic if her symptoms are not improving. I advised to return here if she has any worsening symptoms.  I personally performed the services described in this documentation, which was scribed in my presence.  The recorded information has been reviewed and considered.   Rolan BuccoMelanie Samie Barclift, MD 05/24/16 [phone]

## 2016-05-24 NOTE — ED Notes (Signed)
Pt to ED with c/o left chest pain and shortness of breath, onset approx 4pm.  Pt st's she is 16 wks preg.

## 2016-06-09 ENCOUNTER — Encounter (HOSPITAL_COMMUNITY): Payer: Self-pay | Admitting: Family

## 2016-06-11 ENCOUNTER — Ambulatory Visit (INDEPENDENT_AMBULATORY_CARE_PROVIDER_SITE_OTHER): Payer: Self-pay | Admitting: Obstetrics and Gynecology

## 2016-06-11 VITALS — BP 117/63 | HR 100 | Temp 98.5°F | Wt 124.6 lb

## 2016-06-11 DIAGNOSIS — Z3482 Encounter for supervision of other normal pregnancy, second trimester: Secondary | ICD-10-CM

## 2016-06-11 DIAGNOSIS — O2342 Unspecified infection of urinary tract in pregnancy, second trimester: Secondary | ICD-10-CM

## 2016-06-11 LAB — POCT URINALYSIS DIP (DEVICE)
GLUCOSE, UA: 100 mg/dL — AB
Ketones, ur: 80 mg/dL — AB
NITRITE: NEGATIVE
Protein, ur: 30 mg/dL — AB
Specific Gravity, Urine: 1.025 (ref 1.005–1.030)
UROBILINOGEN UA: 1 mg/dL (ref 0.0–1.0)
pH: 6.5 (ref 5.0–8.0)

## 2016-06-11 NOTE — Progress Notes (Signed)
Prenatal Visit Note Date: 06/11/2016 Clinic: [facility]  Subjective:  Monica [name] is a 20 y.o. G1P0 at [redacted]w[redacted]d being seen today for ongoing prenatal care.  She is currently monitored for the following issues for this low-risk pregnancy and has Supervision of normal pregnancy, antepartum and UTI in pregnancy, antepartum on her problem list.  Patient reports with some viral URI s/s.   . Vag. Bleeding: None.  Movement: Present. Denies leaking of fluid.   The following portions of the patient's history were reviewed and updated as appropriate: allergies, current medications, past family history, past medical history, past social history, past surgical history and problem list. Problem list updated.  Objective:   Vitals:   06/11/16 1247  BP: 117/63  Pulse: (!) 116  Temp: 98.5 F (36.9 C)  Weight: 124 lb 9.6 oz (56.5 kg)    Fetal Status: Fetal Heart Rate (bpm): 150   Movement: Present     General:  Alert, oriented and cooperative. Patient is in no acute distress.  Skin: Skin is warm and dry. No rash noted.   Cardiovascular: Normal heart rate noted  Respiratory: Normal respiratory effort, no problems with respiration noted  Abdomen: Soft, gravid, appropriate for gestational age. Pain/Pressure: Present     Pelvic:  Cervical exam deferred        Extremities: Normal range of motion.  Edema: None  Mental Status: Normal mood and affect. Normal behavior. Normal judgment and thought content.   Urinalysis:      Assessment and Plan:  Pregnancy: G1P0 at [redacted]w[redacted]d  1. Supervision of normal pregnancy, antepartum, second trimester Anatomy scan already scheduled for 8/9 - AFP/Quad Scr  2. UTI in pregnancy, antepartum, second trimester - Urine Culture TOC today  Urgent care precautions and PRN OTCs for URI s/s.    Preterm labor symptoms and general obstetric precautions including but not limited to vaginal bleeding, contractions, leaking of fluid and fetal  movement were reviewed in detail with the patient. Please refer to After Visit Summary for other counseling recommendations.  Return in about 4 weeks (around 07/09/2016).   Griswold Bing, MD

## 2016-06-11 NOTE — Addendum Note (Signed)
Addended by: [place] Bing on: 06/11/2016 01:17 PM   Modules accepted: Orders

## 2016-06-12 LAB — AFP, QUAD SCREEN
AFP: 66.1 ng/mL
Age Alone: 1:1180 {titer}
CURR GEST AGE: 18.1 wk
Down Syndrome Scr Risk Est: 1:15800 {titer}
HCG, Total: 32.75 IU/mL
INH: 169 pg/mL
Interpretation-AFP: NEGATIVE
MOM FOR HCG: 1.27
MOM FOR INH: 1.16
MoM for AFP: 1.36
Open Spina bifida: NEGATIVE
TRI 18 SCR RISK EST: NEGATIVE
Trisomy 18 (Edward) Syndrome Interp.: 1:116000 {titer}
UE3 VALUE: 1.64 ng/mL
uE3 Mom: 0.89

## 2016-06-12 LAB — URINE CULTURE: Organism ID, Bacteria: NO GROWTH

## 2016-06-17 ENCOUNTER — Ambulatory Visit (HOSPITAL_COMMUNITY)
Admission: RE | Admit: 2016-06-17 | Discharge: 2016-06-17 | Disposition: A | Discharge status: Home / Self Care | Payer: Self-pay | Source: Ambulatory Visit | Attending: Family | Admitting: Family

## 2016-06-17 ENCOUNTER — Other Ambulatory Visit: Payer: Self-pay | Admitting: Family

## 2016-06-17 DIAGNOSIS — Z36 Encounter for antenatal screening of mother: Secondary | ICD-10-CM | POA: Insufficient documentation

## 2016-06-17 DIAGNOSIS — Z3A19 19 weeks gestation of pregnancy: Secondary | ICD-10-CM | POA: Insufficient documentation

## 2016-06-17 DIAGNOSIS — Z1389 Encounter for screening for other disorder: Secondary | ICD-10-CM

## 2016-06-17 DIAGNOSIS — Z3402 Encounter for supervision of normal first pregnancy, second trimester: Secondary | ICD-10-CM

## 2016-06-18 ENCOUNTER — Inpatient Hospital Stay (HOSPITAL_COMMUNITY)
Admission: AD | Admit: 2016-06-18 | Discharge: 2016-06-18 | Disposition: A | Discharge status: Home / Self Care | Payer: Self-pay | Source: Ambulatory Visit | Attending: Obstetrics & Gynecology | Admitting: Obstetrics & Gynecology

## 2016-06-18 ENCOUNTER — Encounter (HOSPITAL_COMMUNITY): Payer: Self-pay | Admitting: *Deleted

## 2016-06-18 DIAGNOSIS — Z3A19 19 weeks gestation of pregnancy: Secondary | ICD-10-CM | POA: Insufficient documentation

## 2016-06-18 DIAGNOSIS — R197 Diarrhea, unspecified: Secondary | ICD-10-CM | POA: Insufficient documentation

## 2016-06-18 DIAGNOSIS — Z87891 Personal history of nicotine dependence: Secondary | ICD-10-CM | POA: Insufficient documentation

## 2016-06-18 DIAGNOSIS — O2342 Unspecified infection of urinary tract in pregnancy, second trimester: Secondary | ICD-10-CM | POA: Insufficient documentation

## 2016-06-18 DIAGNOSIS — Z3482 Encounter for supervision of other normal pregnancy, second trimester: Secondary | ICD-10-CM

## 2016-06-18 DIAGNOSIS — M549 Dorsalgia, unspecified: Secondary | ICD-10-CM | POA: Insufficient documentation

## 2016-06-18 DIAGNOSIS — O26892 Other specified pregnancy related conditions, second trimester: Secondary | ICD-10-CM | POA: Insufficient documentation

## 2016-06-18 DIAGNOSIS — O9989 Other specified diseases and conditions complicating pregnancy, childbirth and the puerperium: Secondary | ICD-10-CM

## 2016-06-18 LAB — CBC
HEMATOCRIT: 31 % — AB (ref 36.0–46.0)
HEMOGLOBIN: 10.8 g/dL — AB (ref 12.0–15.0)
MCH: 28.1 pg (ref 26.0–34.0)
MCHC: 34.8 g/dL (ref 30.0–36.0)
MCV: 80.7 fL (ref 78.0–100.0)
Platelets: 247 10*3/uL (ref 150–400)
RBC: 3.84 MIL/uL — ABNORMAL LOW (ref 3.87–5.11)
RDW: 13.7 % (ref 11.5–15.5)
WBC: 7.5 10*3/uL (ref 4.0–10.5)

## 2016-06-18 LAB — URINALYSIS, ROUTINE W REFLEX MICROSCOPIC
Bilirubin Urine: NEGATIVE
GLUCOSE, UA: NEGATIVE mg/dL
Ketones, ur: NEGATIVE mg/dL
Nitrite: NEGATIVE
Protein, ur: NEGATIVE mg/dL
SPECIFIC GRAVITY, URINE: 1.02 (ref 1.005–1.030)
pH: 6 (ref 5.0–8.0)

## 2016-06-18 LAB — URINE MICROSCOPIC-ADD ON

## 2016-06-18 NOTE — Discharge Instructions (Signed)

## 2016-06-18 NOTE — MAU Note (Cosign Needed)
History     CSN: [id number]  Arrival date and time: 06/18/16 1032  Chief Complaint  Patient presents with  . Abdominal Pain  . Back Pain  . Diarrhea   HPI Monica [name] is a 20 y.o. G1P0 at [redacted]w[redacted]d who presents with 2 days of intermittent lower back pain and lower abdominal pain. She describes the pain as 7/10 and reports some radiation down into superior gluteal area. The pain sometimes makes it difficult to walk. She has also had watery diarrhea over the past day. No dysuria or hematuria, vaginal bleeding, new vaginal discharge, N/V, fevers/chills. She has not tried any medications. Routine USN yesterday unremarkable except ? prominent loop of bowel. H/o UTI at 13 weeks, resolution after nitro (confirmed with TOC).   OB History    Gravida Para Term Preterm AB Living   1             SAB TAB Ectopic Multiple Live Births           0      Past Medical History:  Diagnosis Date  . Abdominal pain, recurrent   . Medical history non-contributory     Past Surgical History:  Procedure Laterality Date  . NO PAST SURGERIES      History reviewed. No pertinent family history.  Social History  Substance Use Topics  . Smoking status: Former Smoker    Quit date: 01/07/2016  . Smokeless tobacco: Never Used  . Alcohol use No    Allergies: No Known Allergies  Prescriptions Prior to Admission  Medication Sig Dispense Refill Last Dose  . Prenatal Vit-Fe Fumarate-FA (PRENATAL MULTIVITAMIN) TABS tablet Take 1 tablet by mouth daily at 12 noon.   06/17/2016 at Unknown time  . clotrimazole (GYNE-LOTRIMIN) 1 % vaginal cream Place 1 Applicatorful vaginally at bedtime. (Patient not taking: Reported on 05/07/2016) 45 g 0 Not Taking  . docusate sodium (COLACE) 100 MG capsule Take 1 capsule (100 mg total) by mouth every 12 (twelve) hours. (Patient not taking: Reported on 03/15/2016) 60 capsule 0 Not Taking  . hydrocortisone (ANUSOL-HC) 2.5 % rectal cream Place 1 application rectally 2 (two)  times daily. (Patient not taking: Reported on 01/25/2015) 30 g 2 Not Taking  . ibuprofen (ADVIL,MOTRIN) 600 MG tablet Take 1 tablet (600 mg total) by mouth every 8 (eight) hours as needed for moderate pain. (Patient not taking: Reported on 03/15/2016) 30 tablet 0 Not Taking  . polyethylene glycol powder (MIRALAX) powder Take 17 g by mouth daily. (Patient not taking: Reported on 01/25/2015) 255 g 12 Not Taking    Review of Systems  Constitutional: Negative for chills and fever.  Gastrointestinal: Positive for abdominal pain and diarrhea. Negative for blood in stool, constipation, nausea and vomiting.  Genitourinary: Negative for dysuria, flank pain and hematuria.   Physical Exam   Blood pressure 117/64, pulse 82, temperature 97.9 F (36.6 C), temperature source Oral, resp. rate 16, weight 56.5 kg (124 lb 8 oz), last menstrual period 02/05/2016.  Physical Exam  Constitutional: She appears well-developed and well-nourished.  Cardiovascular: Normal rate, regular rhythm and normal heart sounds.   Respiratory: Effort normal and breath sounds normal.  GI: Soft. Bowel sounds are normal. There is tenderness in the right lower quadrant. There is no CVA tenderness.  RLQ tenderness increased with palpation of the LLQ.  Musculoskeletal:       Lumbar back: She exhibits tenderness. She exhibits no swelling.  Lumbar tenderness with radiating pain to superior gluteal muscles.    MAU  Course  Procedures  MDM UA with large leukocytes, trace Hgb, few bacteria, 6-30 squams/LPF. CBC with low RBC, H&H (3.84/10.8/31.0), otherwise unremarkable. Urine culture ordered.  Assessment and Plan  Monica [name] is a 20 y.o. G1P0 at 5652w1d who presents with lower back pain but no fevers, N/V, or CVA tenderness. US performed yesterday and wnl.  -Urine culture pending. Given equivocal UA results (large leukocytes, negative nitrites), would treat if significant growth at 48 hours.  -Discussed back pain of  pregnancy and return precautions. [facility] or sooner with any acute issues.  Monica [name] 06/18/2016, 11:50 AM

## 2016-06-18 NOTE — MAU Provider Note (Signed)
Chief Complaint: Abdominal Pain; Back Pain; and Diarrhea   First Provider Initiated Contact with Patient 06/18/16 1204      SUBJECTIVE HPI: Monica [name] is a 20 y.o. G1P0 at [redacted]w[redacted]d by LMP who presents to maternity admissions reporting onset of back pain 2-3 days ago that is constant, worsening gradually, and unaffected by Tylenol.  It is associated with some diarrhea x 4-5 in last 24 hours.  She is feeling fetal movement. She denies vaginal bleeding, vaginal itching/burning, urinary symptoms, h/a, dizziness, n/v, or fever/chills.     HPI  Past Medical History:  Diagnosis Date  . Abdominal pain, recurrent   . Medical history non-contributory    Past Surgical History:  Procedure Laterality Date  . NO PAST SURGERIES     Social History   Social History  . Marital status: Single    Spouse name: N/A  . Number of children: N/A  . Years of education: N/A   Occupational History  . Not on file.   Social History Main Topics  . Smoking status: Former Smoker    Quit date: 01/07/2016  . Smokeless tobacco: Never Used  . Alcohol use No  . Drug use: No  . Sexual activity: No   Other Topics Concern  . Not on file   Social History Narrative   11th grade   No current facility-administered medications on file prior to encounter.    Current Outpatient Prescriptions on File Prior to Encounter  Medication Sig Dispense Refill  . Prenatal Vit-Fe Fumarate-FA (PRENATAL MULTIVITAMIN) TABS tablet Take 1 tablet by mouth daily at 12 noon.    . cephALEXin (KEFLEX) 500 MG capsule Take 1 capsule (500 mg total) by mouth 3 (three) times daily. (Patient not taking: Reported on 03/15/2016) 30 capsule 0  . clotrimazole (GYNE-LOTRIMIN) 1 % vaginal cream Place 1 Applicatorful vaginally at bedtime. (Patient not taking: Reported on 05/07/2016) 45 g 0  . docusate sodium (COLACE) 100 MG capsule Take 1 capsule (100 mg total) by mouth every 12 (twelve) hours. (Patient not taking: Reported on 03/15/2016) 60  capsule 0  . hydrocortisone (ANUSOL-HC) 2.5 % rectal cream Place 1 application rectally 2 (two) times daily. (Patient not taking: Reported on 01/25/2015) 30 g 2  . ibuprofen (ADVIL,MOTRIN) 600 MG tablet Take 1 tablet (600 mg total) by mouth every 8 (eight) hours as needed for moderate pain. (Patient not taking: Reported on 03/15/2016) 30 tablet 0  . metroNIDAZOLE (FLAGYL) 500 MG tablet Take 1 tablet (500 mg total) by mouth 2 (two) times daily. (Patient not taking: Reported on 05/07/2016) 14 tablet 0  . nitrofurantoin, macrocrystal-monohydrate, (MACROBID) 100 MG capsule Take 1 capsule (100 mg total) by mouth 2 (two) times daily. (Patient not taking: Reported on 05/24/2016) 14 capsule 1  . polyethylene glycol powder (MIRALAX) powder Take 17 g by mouth daily. (Patient not taking: Reported on 01/25/2015) 255 g 12   No Known Allergies  ROS:  Review of Systems  Constitutional: Negative for chills, fatigue and fever.  Respiratory: Negative for shortness of breath.   Cardiovascular: Negative for chest pain.  Gastrointestinal: Positive for abdominal pain and diarrhea. Negative for nausea and vomiting.  Genitourinary: Positive for pelvic pain. Negative for difficulty urinating, dysuria, flank pain, vaginal bleeding, vaginal discharge and vaginal pain.  Musculoskeletal: Positive for back pain.  Neurological: Negative for dizziness and headaches.  Psychiatric/Behavioral: Negative.      I have reviewed patient's Past Medical Hx, Surgical Hx, Family Hx, Social Hx, medications and allergies.   Physical Exam  Patient Vitals for the past 24 hrs:  BP Temp Temp src Pulse Resp Weight  06/18/16 1101 117/64 97.9 F (36.6 C) Oral 82 16 124 lb 8 oz (56.5 kg)   Constitutional: Well-developed, well-nourished female in no acute distress.  Cardiovascular: normal rate Respiratory: normal effort GI: Abd soft, non-tender. Pos BS x 4 MS: Extremities nontender, no edema, normal ROM Neurologic: Alert and oriented x 4.   GU: Neg CVAT.  Dilation: Closed Effacement (%): Thick (long) Cervical Position: Posterior Exam by:: L. Leftwhich-Kerby, CNM  LAB RESULTS Results for orders placed or performed during the hospital encounter of 06/18/16 (from the past 24 hour(s))  Urinalysis, Routine w reflex microscopic (not at [facility])     Status: Abnormal   Collection Time: 06/18/16 11:11 AM  Result Value Ref Range   Color, Urine YELLOW YELLOW   APPearance CLOUDY (A) CLEAR   Specific Gravity, Urine 1.020 1.005 - 1.030   pH 6.0 5.0 - 8.0   Glucose, UA NEGATIVE NEGATIVE mg/dL   Hgb urine dipstick TRACE (A) NEGATIVE   Bilirubin Urine NEGATIVE NEGATIVE   Ketones, ur NEGATIVE NEGATIVE mg/dL   Protein, ur NEGATIVE NEGATIVE mg/dL   Nitrite NEGATIVE NEGATIVE   Leukocytes, UA LARGE (A) NEGATIVE  Urine microscopic-add on     Status: Abnormal   Collection Time: 06/18/16 11:11 AM  Result Value Ref Range   Squamous Epithelial / LPF 6-30 (A) NONE SEEN   WBC, UA 6-30 0 - 5 WBC/hpf   RBC / HPF 0-5 0 - 5 RBC/hpf   Bacteria, UA FEW (A) NONE SEEN  CBC     Status: Abnormal   Collection Time: 06/18/16  1:04 PM  Result Value Ref Range   WBC 7.5 4.0 - 10.5 K/uL   RBC 3.84 (L) 3.87 - 5.11 MIL/uL   Hemoglobin 10.8 (L) 12.0 - 15.0 g/dL   HCT 16.1 (L) 09.6 - 04.5 %   MCV 80.7 78.0 - 100.0 fL   MCH 28.1 26.0 - 34.0 pg   MCHC 34.8 30.0 - 36.0 g/dL   RDW 40.9 81.1 - 91.4 %   Platelets 247 150 - 400 K/uL    B/POS/-- (06/29 1358)   MAU Management/MDM: Ordered labs and reviewed results.  No evidence of preterm labor.  No CVAT or elevated WBCs so will send urine for culture at this time.  Treat for back pain in pregnancy/musculoskeletal pain with rest/ice/heat/warm bath/Tylenol.  May use short course of ibuprofen x 3-4 days. Pt stable at time of discharge.  ASSESSMENT 1. Back pain complicating pregnancy, second trimester   2. UTI in pregnancy, antepartum, second trimester   3. Supervision of normal pregnancy, antepartum, second  trimester     PLAN Discharge home    Medication List    STOP taking these medications   cephALEXin 500 MG capsule Commonly known as:  KEFLEX   clotrimazole 1 % vaginal cream Commonly known as:  GYNE-LOTRIMIN   docusate sodium 100 MG capsule Commonly known as:  COLACE   hydrocortisone 2.5 % rectal cream Commonly known as:  ANUSOL-HC   ibuprofen 600 MG tablet Commonly known as:  ADVIL,MOTRIN   metroNIDAZOLE 500 MG tablet Commonly known as:  FLAGYL   nitrofurantoin (macrocrystal-monohydrate) 100 MG capsule Commonly known as:  MACROBID   polyethylene glycol powder powder Commonly known as:  MIRALAX     TAKE these medications   prenatal multivitamin Tabs tablet Take 1 tablet by mouth daily at 12 noon.      Follow-up Information  [facility]   Specialty:  Obstetrics and Gynecology Why:  As scheduled, return to MAU as needed for emergencies Contact information: [address] [place] [id number] [phone]          Sharen CounterLisa Leftwich-Kirby Certified Nurse-Midwife 06/18/2016  1:46 PM

## 2016-07-08 ENCOUNTER — Ambulatory Visit (INDEPENDENT_AMBULATORY_CARE_PROVIDER_SITE_OTHER): Payer: Self-pay | Admitting: Obstetrics & Gynecology

## 2016-07-08 VITALS — BP 119/60 | HR 87 | Wt 127.0 lb

## 2016-07-08 DIAGNOSIS — Z3482 Encounter for supervision of other normal pregnancy, second trimester: Secondary | ICD-10-CM

## 2016-07-08 LAB — POCT URINALYSIS DIP (DEVICE)
Bilirubin Urine: NEGATIVE
Glucose, UA: NEGATIVE mg/dL
Hgb urine dipstick: NEGATIVE
KETONES UR: NEGATIVE mg/dL
NITRITE: NEGATIVE
PH: 7 (ref 5.0–8.0)
PROTEIN: NEGATIVE mg/dL
Specific Gravity, Urine: 1.02 (ref 1.005–1.030)
Urobilinogen, UA: 0.2 mg/dL (ref 0.0–1.0)

## 2016-07-08 NOTE — Progress Notes (Signed)
Patient reports lower back pain  

## 2016-07-08 NOTE — Patient Instructions (Signed)

## 2016-07-08 NOTE — Progress Notes (Signed)
   PRENATAL VISIT NOTE  Subjective:  Monica [name] is a 20 y.o. G1P0 at 6338w0d being seen today for ongoing prenatal care.  She is currently monitored for the following issues for this low-risk pregnancy and has Supervision of normal pregnancy, antepartum and UTI in pregnancy, antepartum on her problem list.  Patient reports backache.  Contractions: Not present. Vag. Bleeding: None.  Movement: Present. Denies leaking of fluid.   The following portions of the patient's history were reviewed and updated as appropriate: allergies, current medications, past family history, past medical history, past social history, past surgical history and problem list. Problem list updated.  Objective:   Vitals:   07/08/16 1311  BP: 119/60  Pulse: 87  Weight: 127 lb (57.6 kg)    Fetal Status: Fetal Heart Rate (bpm): 164   Movement: Present     General:  Alert, oriented and cooperative. Patient is in no acute distress.  Skin: Skin is warm and dry. No rash noted.   Cardiovascular: Normal heart rate noted  Respiratory: Normal respiratory effort, no problems with respiration noted  Abdomen: Soft, gravid, appropriate for gestational age. Pain/Pressure: Present     Pelvic:  Cervical exam deferred        Extremities: Normal range of motion.  Edema: None  Mental Status: Normal mood and affect. Normal behavior. Normal judgment and thought content.   Urinalysis: Urine Protein: Trace Urine Glucose: Negative  Assessment and Plan:  Pregnancy: G1P0 at 6838w0d  1. Supervision of normal pregnancy, antepartum, second trimester  - US MFM OB FOLLOW UP; Future To f/u on prominent loop of bowel.  Preterm labor symptoms and general obstetric precautions including but not limited to vaginal bleeding, contractions, leaking of fluid and fetal movement were reviewed in detail with the patient. Please refer to After Visit Summary for other counseling recommendations.  Return in about 4 weeks (around  08/05/2016).  Willodean Rosenthalarolyn Harraway-Smith, MD

## 2016-07-23 ENCOUNTER — Ambulatory Visit (HOSPITAL_COMMUNITY)
Admission: RE | Admit: 2016-07-23 | Discharge: 2016-07-23 | Disposition: A | Discharge status: Home / Self Care | Payer: Self-pay | Source: Ambulatory Visit | Attending: Obstetrics & Gynecology | Admitting: Obstetrics & Gynecology

## 2016-07-23 ENCOUNTER — Other Ambulatory Visit: Payer: Self-pay | Admitting: Obstetrics & Gynecology

## 2016-07-23 DIAGNOSIS — O283 Abnormal ultrasonic finding on antenatal screening of mother: Secondary | ICD-10-CM

## 2016-07-23 DIAGNOSIS — Z3A24 24 weeks gestation of pregnancy: Secondary | ICD-10-CM | POA: Insufficient documentation

## 2016-07-23 DIAGNOSIS — Z3482 Encounter for supervision of other normal pregnancy, second trimester: Secondary | ICD-10-CM

## 2016-08-06 ENCOUNTER — Encounter: Payer: Self-pay | Admitting: Family Medicine

## 2016-08-07 ENCOUNTER — Ambulatory Visit (INDEPENDENT_AMBULATORY_CARE_PROVIDER_SITE_OTHER): Payer: Self-pay | Admitting: Family Medicine

## 2016-08-07 VITALS — BP 103/55 | HR 68 | Wt 131.9 lb

## 2016-08-07 DIAGNOSIS — Z3482 Encounter for supervision of other normal pregnancy, second trimester: Secondary | ICD-10-CM

## 2016-08-07 DIAGNOSIS — Z23 Encounter for immunization: Secondary | ICD-10-CM

## 2016-08-07 NOTE — Progress Notes (Signed)
   PRENATAL VISIT NOTE  Subjective:  Monica [name] is a 20 y.o. G1P0 at 2864w2d being seen today for ongoing prenatal care.  She is currently monitored for the following issues for this low-risk pregnancy and has Supervision of normal pregnancy, antepartum and UTI in pregnancy, antepartum on her problem list.  Patient reports no complaints.  Contractions: Irritability. Vag. Bleeding: None.  Movement: Present. Denies leaking of fluid.   The following portions of the patient's history were reviewed and updated as appropriate: allergies, current medications, past family history, past medical history, past social history, past surgical history and problem list. Problem list updated.  Objective:   Vitals:   08/07/16 0810  BP: (!) 103/55  Pulse: 68  Weight: 131 lb 14.4 oz (59.8 kg)    Fetal Status: Fetal Heart Rate (bpm): 140 Fundal Height: 25 cm Movement: Present     General:  Alert, oriented and cooperative. Patient is in no acute distress.  Skin: Skin is warm and dry. No rash noted.   Cardiovascular: Normal heart rate noted  Respiratory: Normal respiratory effort, no problems with respiration noted  Abdomen: Soft, gravid, appropriate for gestational age. Pain/Pressure: Present     Pelvic:  Cervical exam deferred        Extremities: Normal range of motion.  Edema: None  Mental Status: Normal mood and affect. Normal behavior. Normal judgment and thought content.   Urinalysis:      Assessment and Plan:  Pregnancy: G1P0 at 8064w2d  1. Needs flu shot - Flu Vaccine QUAD 36+ mos IM (Fluarix, Quad PF)  2. Supervision of normal pregnancy, antepartum, second trimester Continue routine prenatal care.   Preterm labor symptoms and general obstetric precautions including but not limited to vaginal bleeding, contractions, leaking of fluid and fetal movement were reviewed in detail with the patient. Please refer to After Visit Summary for other counseling recommendations.  [facility] 2  [facility] (on 08/21/2016[facility].  Reva Boresanya S Janae Bonser, MD

## 2016-08-07 NOTE — Patient Instructions (Signed)
Segundo trimestre de Media planner (Second Trimester of Pregnancy) El segundo trimestre va desde la semana13 hasta la 48, desde el cuarto hasta el sexto mes, y suele ser el momento en el que mejor se siente. Su organismo se ha adaptado a Public relations account executive y comienza a Print production planner. En general, las nuseas matutinas han disminuido o han desaparecido completamente, puede tener ms energa y un aumento de apetito. El segundo trimestre es tambin la poca en la que el feto se desarrolla rpidamente. Hacia el final del sexto mes, el feto mide aproximadamente 9pulgadas (23cm) y pesa alrededor de 1 libras (700g). Es probable que sienta que el beb se Software engineer (da pataditas) entre las 108 y 20semanas del Media planner. CAMBIOS EN EL ORGANISMO Su organismo atraviesa por muchos cambios durante el [place], y estos varan de Ardelia Mems mujer a Theatre manager.   Seguir [employer]. Notar que la parte baja del abdomen sobresale.  Podrn aparecer las primeras Apache Corporation caderas, el abdomen y las [place].  Es posible que tenga dolores de cabeza que pueden aliviarse con los medicamentos que el mdico le permita tomar.  Tal vez tenga necesidad de orinar con ms frecuencia porque el feto est ejerciendo presin Field seismologist.  Debido al Glennis Brink podr sentir Victorio Palm estomacal con frecuencia.  Puede estar estreida, ya que ciertas hormonas enlentecen los movimientos de los msculos que JPMorgan Chase & Co desechos a travs de los intestinos.  Pueden aparecer hemorroides o abultarse e hincharse las venas (venas varicosas).  Puede tener dolor de espalda que se debe al Southern Company de peso y a que las hormonas del Scientist, research (life sciences) las articulaciones entre los huesos de la pelvis, y Civil Service fast streamer consecuencia de la modificacin del peso y los msculos que mantienen el equilibrio.  Las Lincoln National Corporation seguirn creciendo y Teaching laboratory technician.  Las Production manager y estar sensibles al cepillado y al hilo dental.  Pueden aparecer zonas oscuras o  manchas (cloasma, mscara del Media planner) en el rostro que probablemente se atenuar despus del nacimiento del beb.  Es posible que se forme una lnea oscura desde el ombligo hasta la zona del pubis (linea nigra) que probablemente se atenuar despus del nacimiento del beb.  Tal vez haya cambios en el cabello que pueden incluir su engrosamiento, crecimiento rpido y cambios en la textura. Adems, a algunas mujeres se les cae el cabello durante o despus del embarazo, o tienen el cabello seco o fino. Lo ms probable es que el cabello se le normalice despus del nacimiento del beb. QU DEBE ESPERAR EN LAS CONSULTAS PRENATALES Durante una visita prenatal de rutina:  La pesarn para asegurarse de que usted y el feto estn creciendo normalmente.  Le tomarn la presin arterial.  Le medirn el abdomen para controlar el desarrollo del beb.  Se escucharn los latidos cardacos fetales.  Se evaluarn los resultados de los estudios solicitados en visitas anteriores. El mdico puede preguntarle lo siguiente:  Cmo se siente.  Si siente los movimientos del beb.  Si ha tenido sntomas anormales, como prdida de lquido, [place], dolores de cabeza intensos o clicos abdominales.  Si est consumiendo algn producto que contenga tabaco, como cigarrillos, tabaco de Higher education careers adviser y Psychologist, sport and exercise.  Si tiene Sunoco. Otros estudios que podrn realizarse durante el segundo trimestre incluyen lo siguiente:  Anlisis de sangre para detectar lo siguiente:  Concentraciones de hierro bajas (anemia).  Diabetes gestacional (entre la semana 24 y la 22).  Anticuerpos Rh.  Anlisis de orina para detectar infecciones, diabetes o protenas en la  orina.  Una ecografa para confirmar que el beb crece y se desarrolla correctamente.  Una amniocentesis para diagnosticar posibles problemas genticos.  Estudios del feto para descartar espina bfida y sndrome de Down.  Prueba del VIH (virus  de inmunodeficiencia humana). Los exmenes prenatales de rutina incluyen la prueba de deteccin del VIH, a menos que decida no realizrsela. INSTRUCCIONES PARA EL CUIDADO EN EL HOGAR   Evite fumar, consumir hierbas, beber alcohol y tomar frmacos que no le hayan recetado. Estas sustancias qumicas afectan la formacin y el desarrollo del beb.  No consuma ningn producto que contenga tabaco, lo que incluye cigarrillos, tabaco de mascar y cigarrillos electrnicos. Si necesita ayuda para dejar de fumar, consulte al mdico. Puede recibir asesoramiento y otro tipo de recursos para dejar de fumar.  Siga las indicaciones del mdico en relacin con el uso de medicamentos. Durante el embarazo, hay medicamentos que son seguros de tomar y otros que no.  Haga ejercicio solamente como se lo haya indicado el mdico. Sentir clicos uterinos es un buen signo para detener la actividad fsica.  Contine comiendo alimentos sanos con regularidad.  Use un sostn que le brinde buen soporte si le duelen las mamas.  No se d baos de inmersin en agua caliente, baos turcos ni saunas.  Use el cinturn de seguridad en todo momento mientras conduce.  No coma carne cruda ni queso sin cocinar; evite el contacto con las bandejas sanitarias de los gatos y la tierra que estos animales usan. Estos elementos contienen grmenes que pueden causar defectos congnitos en el beb.  Tome las vitaminas prenatales.  Tome entre 1500 y 2000mg de calcio diariamente comenzando en la semana20 del embarazo hasta el parto.  Si est estreida, pruebe un laxante suave (si el mdico lo autoriza). Consuma ms alimentos ricos en fibra, como vegetales y frutas frescos y cereales integrales. Beba gran cantidad de lquido para mantener la orina de tono claro o color amarillo plido.  Dese baos de asiento con agua tibia para aliviar el dolor o las molestias causadas por las hemorroides. Use una crema para las hemorroides si el mdico la  autoriza.  Si tiene venas varicosas, use medias de descanso. Eleve los pies durante 15minutos, 3 o 4veces por da. Limite el consumo de sal en su dieta.  No levante objetos pesados, use zapatos de tacones bajos y mantenga una buena postura.  Descanse con las piernas elevadas si tiene calambres o dolor de cintura.  Visite a su dentista si an no lo ha hecho durante el embarazo. Use un cepillo de dientes blando para higienizarse los dientes y psese el hilo dental con suavidad.  Puede seguir manteniendo relaciones sexuales, a menos que el mdico le indique lo contrario.  Concurra a todas las visitas prenatales segn las indicaciones de su mdico. SOLICITE ATENCIN MDICA SI:   Tiene mareos.  Siente clicos leves, presin en la pelvis o dolor persistente en el abdomen.  Tiene nuseas, vmitos o diarrea persistentes.  Observa una secrecin vaginal con mal olor.  Siente dolor al orinar. SOLICITE ATENCIN MDICA DE INMEDIATO SI:   Tiene fiebre.  Tiene una prdida de lquido por la vagina.  Tiene sangrado o pequeas prdidas vaginales.  Siente dolor intenso o clicos en el abdomen.  Sube o baja de peso rpidamente.  Tiene dificultad para respirar y siente dolor de pecho.  Sbitamente se le hinchan mucho el rostro, las manos, los tobillos, los pies o las piernas.  No ha sentido los movimientos del beb durante   Georgianne Fickuna hora.  Siente un dolor de cabeza intenso que no se alivia con medicamentos.  Su visin se modifica.   Esta informacin no tiene Theme park managercomo fin reemplazar el consejo del mdico. Asegrese de hacerle al mdico cualquier pregunta que tenga.   Document Released: 08/05/2005 Document Revised: 11/16/2014 Elsevier Interactive Patient Education 2016 ArvinMeritorElsevier Inc.   RollaLactancia materna (Breastfeeding) Decidir Museum/gallery exhibitions officeramamantar es una de las mejores elecciones que puede hacer por usted y su beb. El cambio hormonal durante el Psychiatristembarazo produce el desarrollo del tejido mamario y Lesothoaumenta  la cantidad y el tamao de los conductos galactforos. Estas hormonas tambin permiten que las protenas, los azcares y las grasas de la sangre produzcan la WPS Resourcesleche materna en las glndulas productoras de Garlandleche. Las hormonas impiden que la leche materna sea liberada antes del nacimiento del beb, adems de impulsar el flujo de leche luego del nacimiento. Una vez que ha comenzado a Museum/gallery exhibitions officeramamantar, Conservation officer, naturepensar en el beb, as Immunologistcomo la succin o Theatre managerel llanto, pueden estimular la liberacin de Bellerive Acresleche de las glndulas productoras de Waukenaleche.  LOS BENEFICIOS DE AMAMANTAR Para el beb  La primera leche (calostro) ayuda a Careers information officermejorar el funcionamiento del sistema digestivo del beb.  La leche tiene anticuerpos que ayudan a Radio producerprevenir las infecciones en el beb.  El beb tiene una menor incidencia de asma, alergias y del sndrome de muerte sbita del lactante.  Los nutrientes en la Santa Rosaleche materna son mejores para el beb que la Baileyleche maternizada y estn preparados exclusivamente para cubrir las necesidades del beb.  La leche materna mejora el desarrollo cerebral del beb.  Es menos probable que el beb desarrolle otras enfermedades, como obesidad infantil, asma o diabetes mellitus de tipo 2. Para usted   La lactancia materna favorece el desarrollo de un vnculo muy especial entre la madre y el beb.  Es conveniente. La leche materna siempre est disponible a la Human resources officertemperatura correcta y es East Falmoutheconmica.  La lactancia materna ayuda a quemar caloras y a perder el peso ganado durante el Ringgoldembarazo.  Favorece la contraccin del tero al tamao que tena antes del embarazo de manera ms rpida y disminuye el sangrado (loquios) despus del parto.  La lactancia materna contribuye a reducir Nurse, adultel riesgo de desarrollar diabetes mellitus de tipo 2, osteoporosis o cncer de mama o de ovario en el futuro. SIGNOS DE QUE EL BEB EST HAMBRIENTO Primeros signos de [address]hambre  Aumenta su estado de Lesothoalerta o actividad.  Se estira.  Mueve la cabeza  de un lado a otro.  Mueve la cabeza y abre la boca cuando se le toca la mejilla o la comisura de la boca (reflejo de bsqueda).  Aumenta las vocalizaciones, tales como sonidos de succin, se relame los labios, emite arrullos, suspiros, o chirridos.  Mueve la Jones Apparel Groupmano hacia la boca.  Se chupa con ganas los dedos o las manos. Signos tardos de Fisher Scientifichambre  Est agitado.  Llora de manera intermitente. Signos de AES Corporationhambre extrema Los signos de hambre extrema requerirn que lo calme y lo consuele antes de que el beb pueda alimentarse adecuadamente. No espere a que se manifiesten los siguientes signos de hambre extrema para comenzar a Museum/gallery exhibitions officeramamantar:   Designer, jewelleryAgitacin.  Llanto intenso y fuerte.  Gritos. INFORMACIN BSICA SOBRE LA LACTANCIA MATERNA Iniciacin de la lactancia materna  Encuentre un lugar cmodo para sentarse o acostarse, con un buen respaldo para el cuello y la espalda.  Coloque una almohada o una manta enrollada debajo del beb para acomodarlo a la altura de la mama (si  est sentada). Las almohadas para amamantar se han diseado especialmente a fin de servir de apoyo para los braMuseum/gallery exhibitions officerel beb [employer] Foods.  Asegrese de que el abdomen del beb est frente al suyo.   Masajee suavemente la mama. Con las yemas de los dedos, masajee la pared del pecho hacia el pezn en un movimiento circular. Esto estimula el flujo de [place]. Es posible que Engineer, manufacturing systems este movimiento mientras amamanta si la leche fluye lentamente.  Sostenga la mama con el pulgar por arriba del pezn y los otros 4 dedos por debajo de la mama. Asegrese de que los dedos se encuentren lejos del pezn y de la boca del beb.  Empuje suavemente los labios del beb con el pezn o con el dedo.  Cuando la boca del beb se abra lo suficiente, acrquelo rpidamente a la mama e introduzca todo el pezn y la zona oscura que lo rodea (areola), tanto como sea posible, dentro de la boca del beb.  Debe haber ms areola visible por  arriba del labio superior del beb que por debajo del labio inferior.  La lengua del beb debe estar entre la enca inferior y la [place].  Asegrese de que la boca del beb est en la posicin correcta alrededor del pezn (prendida). Los labios del beb deben crear un sello sobre la mama y estar doblados hacia afuera (invertidos).  Es comn que el beb succione durante 2 a 3 minutos para que comience el flujo de [place]. Cmo debe prenderse Es muy importante que le ensee al beb cmo prenderse adecuadamente a la mama. Si el beb no se prende adecuadamente, puede causarle dolor en el pezn y reducir la produccin de [place], y hacer que el beb tenga un escaso aumento de [place]. Adems, si el beb no se prende adecuadamente al pezn, puede tragar aire durante la alimentacin. Esto puede causarle molestias al beb. Hacer eructar al beb al Pilar Plate de mama puede ayudarlo a liberar el aire. Sin embargo, ensearle al beb cmo prenderse a la mama adecuadamente es la mejor manera de evitar que se sienta molesto por tragar Oceanographer se alimenta. Signos de que el beb se ha prendido adecuadamente al pezn:   Payton Doughty o succiona de modo silencioso, sin causarle dolor.  Se escucha que traga cada 3 o 4 succiones.  Hay movimientos musculares por arriba y por delante de sus odos al Printmaker. Signos de que el beb no se ha prendido Audiological scientist al pezn:   Hace ruidos de succin o de chasquido mientras se alimenta.  Siente dolor en el pezn. Si cree que el beb no se prendi correctamente, deslice el dedo en la comisura de la boca y Ameren Corporation las encas del beb para interrumpir la succin. Intente comenzar a amamantar nuevamente. Signos de Fish farm manager Signos del beb:   Disminuye gradualmente el nmero de succiones o cesa la succin por completo.  Se duerme.  Relaja el cuerpo.  Retiene una pequea cantidad de [facility] boca.  Se desprende solo del  pecho. Signos que presenta usted:  Las mamas han aumentado la firmeza, el peso y el tamao 1 a 3 horas despus de Museum/gallery exhibitions officer.  Estn ms blandas inmediatamente despus de amamantar.  Un aumento del volumen de [place], y tambin un cambio en su consistencia y color se producen hacia el quinto da de Tour manager.  Los pezones no duelen, ni estn agrietados ni sangran. Signos de que su beb recibe la cantidad de [place]  suficiente  Moja al menos 3 paales en 24 horas. La orina debe ser clara y de color amarillo plido a los [address]  [address] de [place]vida.  Defeca al menos 3 veces en 24 horas a los [address]  [address] de [address]. La materia fecal debe ser blanda y Cove Neckamarillenta.  Defeca al menos 3 veces en 24 horas a los [address] das de [address]. La materia fecal debe ser grumosa y Neskowinamarillenta.  No registra una prdida de peso mayor del 10% del peso al nacer durante los primeros 3 [address]  [address] de [place]vida.  Aumenta de peso un promedio de 4 a 7onzas (113 a 198g) por semana despus de los 4 [address]  [address] de vida.  Aumenta de Johnson Prairiepeso, Havendiariamente, de Pikes Creekmanera uniforme a Glass blower/designerpartir de los 5 [address]  [address] de vida, sin Passenger transport managerregistrar prdida de peso despus de las 2semanas de vida. Despus de alimentarse, es posible que el beb regurgite una pequea cantidad. Esto es frecuente. FRECUENCIA Y DURACIN DE LA LACTANCIA MATERNA El amamantamiento frecuente la ayudar a producir ms Azerbaijanleche y a Education officer, communityprevenir problemas de Engineer, miningdolor en los pezones e hinchazn en las Iowa Parkmamas. Alimente al beb cuando muestre signos de hambre o si siente la necesidad de reducir la congestin de las Tylertownmamas. Esto se denomina "lactancia a demanda". Evite el uso del chupete mientras trabaja para establecer la lactancia (las primeras 4 a 6 semanas despus del nacimiento del beb). Despus de este perodo, podr ofrecerle un chupete. Las investigaciones demostraron que el uso del chupete durante el primer ao de vida del beb disminuye el riesgo de desarrollar el sndrome de muerte sbita del lactante (SMSL). Permita que el nio  se alimente en cada mama todo lo que desee. Contine amamantando al beb hasta que haya terminado de alimentarse. Cuando el beb se desprende o se queda dormido mientras se est alimentando de la primera mama, ofrzcale la segunda. Debido a que, con frecuencia, los recin Sunoconacidos permanecen somnolientos las primeras semanas de vida, es posible que deba despertar al beb para alimentarlo. Los horarios de Acupuncturistlactancia varan de un beb a otro. Sin embargo, las siguientes reglas pueden servir como gua para ayudarla a Lawyergarantizar que el beb se alimenta adecuadamente:  Se puede amamantar a los recin nacidos (bebs de 4 semanas o menos de vida) cada 1 a 3 horas.  No deben transcurrir ms de 3 horas durante el da o 5 horas durante la noche sin que se amamante a los recin nacidos.  Debe amamantar al beb 8 veces como mnimo en un perodo de 24 horas, hasta que comience a introducir slidos en su dieta, a los 6 meses de vida aproximadamente. EXTRACCIN DE Dean Foods CompanyLECHE MATERNA La extraccin y Contractorel almacenamiento de la leche materna le permiten asegurarse de que el beb se alimente exclusivamente de Spencerleche materna, aun en momentos en los que no puede amamantar. Esto tiene especial importancia si debe regresar al Aleen Campitrabajo en el perodo en que an est amamantando o si no puede estar presente en los momentos en que el beb debe alimentarse. Su asesor en lactancia puede orientarla sobre cunto tiempo es seguro almacenar Wolseyleche materna.  El sacaleche es un aparato que le permite extraer leche de la mama a un recipiente estril. Luego, la leche materna extrada puede almacenarse en un refrigerador o Electrical engineercongelador. Algunos sacaleches son Birdie Riddlemanuales, Delaney Meigsmientras que otros son elctricos. Consulte a su asesor en lactancia qu tipo ser ms conveniente para usted. Los sacaleches se pueden comprar; sin embargo, algunos hospitales y grupos de apoyo a la lactancia materna alquilan Sports coachsacaleches mensualmente. Un  asesor en lactancia puede ensearle  cmo extraer W. [employer]kley, en caso de que prefiera no usar un sacaleche.  CMO CUIDAR LAS MAMAS DURANTE LA LACTANCIA MATERNA Los pezones se secan, agrietan y duelen durante la Tour manager. Las siguientes recomendaciones pueden ayudarla a Pharmacologist las TEPPCO Partners y sanas:  Careers information officer usar jabn en los pezones.  Use un sostn de soporte. Aunque no son esenciales, las camisetas sin mangas o los sostenes especiales para Museum/gallery exhibitions officer estn diseados para acceder fcilmente a las mamas, para Museum/gallery exhibitions officer sin tener que quitarse todo el sostn o la camiseta. Evite usar sostenes con aro o sostenes muy ajustados.  Seque al aire sus pezones durante 3 a despus de amamantar al beb.  Utilice solo apsitos de Haematologist sostn para Environmental health practitioner las prdidas de [place]. La prdida de un poco de Public Service Enterprise Group tomas es normal.  Utilice lanolina sobre los pezones luego de Museum/gallery exhibitions officer. La lanolina ayuda a mantener la humedad normal de la piel. Si [place] lanolina pura, no tiene que lavarse los pezones antes de volver a Corporate treasurer al beb. La lanolina pura no es txica para el beb. Adems, puede extraer Beazer Homes algunas gotas de [place] materna y Engineer, maintenance (IT) suavemente esa Winn[employer], para que la Chance se seque al aire. Durante las primeras semanas despus de dar a luz, algunas mujeres pueden experimentar hinchazn en las mamas (congestin [place]). La congestin puede hacer que sienta las mamas pesadas, calientes y sensibles al tacto. El pico de la congestin ocurre dentro de los 3 a 5 das despus del [place]. Las siguientes recomendaciones pueden ayudarla a Paramedic la congestin:  Vace por completo las mamas al QUALCOMM o Environmental health practitioner. Puede aplicar calor hmedo en las mamas (en la ducha o con toallas hmedas para manos) antes de Museum/gallery exhibitions officer o extraer WPS Resources. Esto aumenta la circulacin y [place] a que la [place]. Si el beb no vaca por completo las [address] cuando lo [address],  extraiga la [place] restante despus de que haya finalizado.  Use un sostn ajustado (para amamantar o comn) o una camiseta sin mangas durante 1 o 2 das para indicar al cuerpo que disminuya ligeramente la produccin de [place].  Aplique compresas de hielo Yahoo! Inc, a menos que le resulte demasiado incmodo.  Asegrese de que el beb est prendido y se encuentre en la posicin correcta mientras lo alimenta. Si la congestin persiste luego de 48 horas o despus de seguir estas recomendaciones, comunquese con su mdico o un Holiday representative. RECOMENDACIONES GENERALES PARA EL CUIDADO DE LA SALUD DURANTE LA LACTANCIA MATERNA  Consuma alimentos saludables. Alterne comidas y colaciones, y coma 3 de cada una por da. Dado que lo que come Danaher Corporation, es posible que algunas comidas hagan que su beb se vuelva ms irritable de lo habitual. Evite comer este tipo de alimentos si percibe que afectan de manera negativa al beb.  Beba leche, jugos de fruta y agua para Patent examiner su sed (aproximadamente 10 vasos al Futures trader).  Descanse con frecuencia, reljese y tome sus vitaminas prenatales para evitar la fatiga, el estrs y la anemia.  Contine con los autocontroles de la mama.  Evite Product manager y fumar tabaco. Las sustancias qumicas de los cigarrillos que pasan a la leche materna y la exposicin al humo ambiental del tabaco pueden daar al beb.  No consuma alcohol ni drogas, incluida la marihuana. Algunos medicamentos, que pueden ser perjudiciales para el beb, pueden pasar a travs de la Colgate Palmolive.  Es importante que consulte a su mdico antes de Medical sales representativetomar cualquier medicamento, incluidos todos los medicamentos recetados y de Alamoventa libre, as como los suplementos vitamnicos y herbales. Puede quedar embarazada durante la lactancia. Si desea controlar la natalidad, consulte a su mdico cules son las opciones ms seguras para el beb. SOLICITE ATENCIN MDICA SI:   Usted siente que quiere  dejar de Museum/gallery exhibitions officeramamantar o se siente frustrada con la lactancia.  Siente dolor en las mamas o en los pezones.  Sus pezones estn agrietados o Water quality scientistsangran.  Sus pechos estn irritados, sensibles o calientes.  Tiene un rea hinchada en cualquiera de las mamas.  Siente escalofros o fiebre.  Tiene nuseas o vmitos.  Presenta una secrecin de otro lquido distinto de la leche materna de los pezones.  Sus mamas no se llenan antes de Museum/gallery exhibitions officeramamantar al beb para el quinto da despus del Ravennaparto.  Se siente triste y deprimida.  El beb est demasiado somnoliento como para comer bien.  El beb tiene problemas para dormir.  Moja menos de 3 paales en 24 horas.  Defeca menos de 3 veces en 24 horas.  La piel del beb o la parte blanca de los ojos se vuelven amarillentas.  El beb no ha aumentado de Shoreacrespeso a los [address] das de [place]ida. SOLICITE ATENCIN MDICA DE INMEDIATO SI:   El beb est muy cansado Retail buyer(letargo) y no se quiere despertar para comer.  Le sube la fiebre sin causa.   Esta informacin no tiene Theme park managercomo fin reemplazar el consejo del mdico. Asegrese de hacerle al mdico cualquier pregunta que tenga.   Document Released: 10/26/2005 Document Revised: 07/17/2015 Elsevier Interactive Patient Education Yahoo! Inc2016 Elsevier Inc.

## 2016-08-07 NOTE — Progress Notes (Signed)
Flu vaccine today 

## 2016-08-24 ENCOUNTER — Ambulatory Visit (INDEPENDENT_AMBULATORY_CARE_PROVIDER_SITE_OTHER): Payer: Self-pay | Admitting: Obstetrics & Gynecology

## 2016-08-24 VITALS — BP 101/65 | HR 80 | Wt 135.1 lb

## 2016-08-24 DIAGNOSIS — Z23 Encounter for immunization: Secondary | ICD-10-CM

## 2016-08-24 DIAGNOSIS — Z113 Encounter for screening for infections with a predominantly sexual mode of transmission: Secondary | ICD-10-CM

## 2016-08-24 DIAGNOSIS — O26893 Other specified pregnancy related conditions, third trimester: Secondary | ICD-10-CM

## 2016-08-24 DIAGNOSIS — N898 Other specified noninflammatory disorders of vagina: Secondary | ICD-10-CM

## 2016-08-24 DIAGNOSIS — Z3400 Encounter for supervision of normal first pregnancy, unspecified trimester: Secondary | ICD-10-CM

## 2016-08-24 LAB — CBC
HEMATOCRIT: 34.7 % — AB (ref 35.0–45.0)
HEMOGLOBIN: 11.2 g/dL — AB (ref 11.7–15.5)
MCH: 28 pg (ref 27.0–33.0)
MCHC: 32.3 g/dL (ref 32.0–36.0)
MCV: 86.8 fL (ref 80.0–100.0)
MPV: 9 fL (ref 7.5–12.5)
Platelets: 261 10*3/uL (ref 140–400)
RBC: 4 MIL/uL (ref 3.80–5.10)
RDW: 13.9 % (ref 11.0–15.0)
WBC: 9 10*3/uL (ref 3.8–10.8)

## 2016-08-24 MED ORDER — FLUCONAZOLE 150 MG PO TABS: 150.0000 mg | ORAL_TABLET | Freq: Once | ORAL | 3 refills | Status: AC

## 2016-08-24 NOTE — Progress Notes (Signed)
1 hour gtt due at 9:09

## 2016-08-24 NOTE — Progress Notes (Signed)
   PRENATAL VISIT NOTE  Subjective:  Monica [name] is a 20 y.o. G1P0 at 9250w5d being seen today for ongoing prenatal care.  She is currently monitored for the following issues for this low-risk pregnancy and has Supervision of normal pregnancy, antepartum and UTI in pregnancy, antepartum on her problem list.  Patient reports thick vaginal discharge.  Contractions: Not present.  .  Movement: Present. Denies leaking of fluid.   The following portions of the patient's history were reviewed and updated as appropriate: allergies, current medications, past family history, past medical history, past social history, past surgical history and problem list. Problem list updated.  Objective:   Vitals:   08/24/16 0809  BP: 101/65  Pulse: 80  Weight: 135 lb 1.6 oz (61.3 kg)    Fetal Status: Fetal Heart Rate (bpm): 145   Movement: Present     General:  Alert, oriented and cooperative. Patient is in no acute distress.  Skin: Skin is warm and dry. No rash noted.   Cardiovascular: Normal heart rate noted  Respiratory: Normal respiratory effort, no problems with respiration noted  Abdomen: Soft, gravid, appropriate for gestational age. Pain/Pressure: Present     Pelvic:  Cervical exam performed        Extremities: Normal range of motion.  Edema: None  Mental Status: Normal mood and affect. Normal behavior. Normal judgment and thought content.   Assessment and Plan:  Pregnancy: G1P0 at 8150w5d  1. Supervision of normal first pregnancy, antepartum 28 weeks labs and Tdap Wants to talk to FOB about Circ - Glucose Tolerance, 1 HR (50g) - CBC - HIV antibody - RPR  2. Vaginal discharge during pregnancy in third trimester - Wet prep, genital - GC/Chlamydia probe amp ([facility] - RPR -Appears to be yeast--Diflucan prescribed  Preterm labor symptoms and general obstetric precautions including but not limited to vaginal bleeding, contractions, leaking of fluid and fetal movement  were reviewed in detail with the patient. Please refer to After Visit Summary for other counseling recommendations.  Return in 2 weeks (on 09/07/2016).  Lesly DukesKelly H Taryne Kiger, MD

## 2016-08-24 NOTE — Patient Instructions (Signed)
Contraception Choices Contraception (birth control) is the use of any methods or devices to prevent pregnancy. Below are some methods to help avoid pregnancy. HORMONAL METHODS   Contraceptive implant. This is a thin, plastic tube containing progesterone hormone. It does not contain estrogen hormone. Your health care provider inserts the tube in the inner part of the upper arm. The tube can remain in place for up to 3 years. After 3 years, the implant must be removed. The implant prevents the ovaries from releasing an egg (ovulation), thickens the cervical mucus to prevent sperm from entering the uterus, and thins the lining of the inside of the uterus.  Progesterone-only injections. These injections are given every 3 months by your health care provider to prevent pregnancy. This synthetic progesterone hormone stops the ovaries from releasing eggs. It also thickens cervical mucus and changes the uterine lining. This makes it harder for sperm to survive in the uterus.  Birth control pills. These pills contain estrogen and progesterone hormone. They work by preventing the ovaries from releasing eggs (ovulation). They also cause the cervical mucus to thicken, preventing the sperm from entering the uterus. Birth control pills are prescribed by a health care provider.Birth control pills can also be used to treat heavy periods.  Minipill. This type of birth control pill contains only the progesterone hormone. They are taken every day of each month and must be prescribed by your health care provider.  Birth control patch. The patch contains hormones similar to those in birth control pills. It must be changed once a week and is prescribed by a health care provider.  Vaginal ring. The ring contains hormones similar to those in birth control pills. It is left in the vagina for 3 weeks, removed for 1 week, and then a new one is put back in place. The patient must be comfortable inserting and removing the ring  from the vagina.A health care provider's prescription is necessary.  Emergency contraception. Emergency contraceptives prevent pregnancy after unprotected sexual intercourse. This pill can be taken right after sex or up to 5 days after unprotected sex. It is most effective the sooner you take the pills after having sexual intercourse. Most emergency contraceptive pills are available without a prescription. Check with your pharmacist. Do not use emergency contraception as your only form of birth control. BARRIER METHODS   Female condom. This is a thin sheath (latex or rubber) that is worn over the penis during sexual intercourse. It can be used with spermicide to increase effectiveness.  Female condom. This is a soft, loose-fitting sheath that is put into the vagina before sexual intercourse.  Diaphragm. This is a soft, latex, dome-shaped barrier that must be fitted by a health care provider. It is inserted into the vagina, along with a spermicidal jelly. It is inserted before intercourse. The diaphragm should be left in the vagina for 6 to 8 hours after intercourse.  Cervical cap. This is a round, soft, latex or plastic cup that fits over the cervix and must be fitted by a health care provider. The cap can be left in place for up to 48 hours after intercourse.  Sponge. This is a soft, circular piece of polyurethane foam. The sponge has spermicide in it. It is inserted into the vagina after wetting it and before sexual intercourse.  Spermicides. These are chemicals that kill or block sperm from entering the cervix and uterus. They come in the form of creams, jellies, suppositories, foam, or tablets. They do not require a   prescription. They are inserted into the vagina with an applicator before having sexual intercourse. The process must be repeated every time you have sexual intercourse. INTRAUTERINE CONTRACEPTION  Intrauterine device (IUD). This is a T-shaped device that is put in a woman's uterus  during a menstrual period to prevent pregnancy. There are 2 types:  Copper IUD. This type of IUD is wrapped in copper wire and is placed inside the uterus. Copper makes the uterus and fallopian tubes produce a fluid that kills sperm. It can stay in place for 10 years.  Hormone IUD. This type of IUD contains the hormone progestin (synthetic progesterone). The hormone thickens the cervical mucus and prevents sperm from entering the uterus, and it also thins the uterine lining to prevent implantation of a fertilized egg. The hormone can weaken or kill the sperm that get into the uterus. It can stay in place for 3-5 years, depending on which type of IUD is used. PERMANENT METHODS OF CONTRACEPTION  Female tubal ligation. This is when the woman's fallopian tubes are surgically sealed, tied, or blocked to prevent the egg from traveling to the uterus.  Hysteroscopic sterilization. This involves placing a small coil or insert into each fallopian tube. Your doctor uses a technique called hysteroscopy to do the procedure. The device causes scar tissue to form. This results in permanent blockage of the fallopian tubes, so the sperm cannot fertilize the egg. It takes about 3 months after the procedure for the tubes to become blocked. You must use another form of birth control for these 3 months.  Female sterilization. This is when the female has the tubes that carry sperm tied off (vasectomy).This blocks sperm from entering the vagina during sexual intercourse. After the procedure, the man can still ejaculate fluid (semen). NATURAL PLANNING METHODS  Natural family planning. This is not having sexual intercourse or using a barrier method (condom, diaphragm, cervical cap) on days the woman could become pregnant.  Calendar method. This is keeping track of the length of each menstrual cycle and identifying when you are fertile.  Ovulation method. This is avoiding sexual intercourse during ovulation.  Symptothermal  method. This is avoiding sexual intercourse during ovulation, using a thermometer and ovulation symptoms.  Post-ovulation method. This is timing sexual intercourse after you have ovulated. Regardless of which type or method of contraception you choose, it is important that you use condoms to protect against the transmission of sexually transmitted infections (STIs). Talk with your health care provider about which form of contraception is most appropriate for you.   This information is not intended to replace advice given to you by your health care provider. Make sure you discuss any questions you have with your health care provider.   Document Released: 10/26/2005 Document Revised: 10/31/2013 Document Reviewed: 04/20/2013 Elsevier Interactive Patient Education 2016 [employer] Levonorgestrel intrauterine device (IUD) What is this medicine? LEVONORGESTREL IUD (LEE voe nor jes trel) is a contraceptive (birth control) device. The device is placed inside the uterus by a healthcare professional. It is used to prevent pregnancy and can also be used to treat heavy bleeding that occurs during your period. Depending on the device, it can be used for 3 to 5 years. This medicine may be used for other purposes; ask your health care provider or pharmacist if you have questions. What should I tell my health care provider before I take this medicine? They need to know if you have any of these conditions: -abnormal Pap smear -cancer of the breast, uterus,  or cervix -diabetes -endometritis -genital or pelvic infection now or in the past -have more than one sexual partner or your partner has more than one partner -heart disease -history of an ectopic or tubal pregnancy -immune system problems -IUD in place -liver disease or tumor -problems with blood clots or take blood-thinners -use intravenous drugs -uterus of unusual shape -vaginal bleeding that has not been explained -an unusual or allergic  reaction to levonorgestrel, other hormones, silicone, or polyethylene, medicines, foods, dyes, or preservatives -pregnant or trying to get pregnant -breast-feeding How should I use this medicine? This device is placed inside the uterus by a health care professional. Talk to your pediatrician regarding the use of this medicine in children. Special care may be needed. Overdosage: If you think you have taken too much of this medicine contact a poison control center or emergency room at once. NOTE: This medicine is only for you. Do not share this medicine with others. What if I miss a dose? This does not apply. What may interact with this medicine? Do not take this medicine with any of the following medications: -amprenavir -bosentan -fosamprenavir This medicine may also interact with the following medications: -aprepitant -barbiturate medicines for inducing sleep or treating seizures -bexarotene -griseofulvin -medicines to treat seizures like carbamazepine, ethotoin, felbamate, oxcarbazepine, phenytoin, topiramate -modafinil -pioglitazone -rifabutin -rifampin -rifapentine -some medicines to treat HIV infection like atazanavir, indinavir, lopinavir, nelfinavir, tipranavir, ritonavir -St. John's wort -warfarin This list may not describe all possible interactions. Give your health care provider a list of all the medicines, herbs, non-prescription drugs, or dietary supplements you use. Also tell them if you smoke, drink alcohol, or use illegal drugs. Some items may interact with your medicine. What should I watch for while using this medicine? Visit your doctor or health care professional for regular check ups. See your doctor if you or your partner has sexual contact with others, becomes HIV positive, or gets a sexual transmitted disease. This product does not protect you against HIV infection (AIDS) or other sexually transmitted diseases. You can check the placement of the IUD yourself by  reaching up to the top of your vagina with clean fingers to feel the threads. Do not pull on the threads. It is a good habit to check placement after each menstrual period. Call your doctor right away if you feel more of the IUD than just the threads or if you cannot feel the threads at all. The IUD may come out by itself. You may become pregnant if the device comes out. If you notice that the IUD has come out use a backup birth control method like condoms and call your health care provider. Using tampons will not change the position of the IUD and are okay to use during your period. What side effects may I notice from receiving this medicine? Side effects that you should report to your doctor or health care professional as soon as possible: -allergic reactions like skin rash, itching or hives, swelling of the face, lips, or tongue -fever, flu-like symptoms -genital sores -high blood pressure -no menstrual period for 6 weeks during use -pain, swelling, warmth in the leg -pelvic pain or tenderness -severe or sudden headache -signs of pregnancy -stomach cramping -sudden shortness of breath -trouble with balance, talking, or walking -unusual vaginal bleeding, discharge -yellowing of the eyes or skin Side effects that usually do not require medical attention (report to your doctor or health care professional if they continue or are bothersome): -acne -breast pain -change in  sex drive or performance -changes in weight -cramping, dizziness, or faintness while the device is being inserted -headache -irregular menstrual bleeding within first 3 to 6 months of use -nausea This list may not describe all possible side effects. Call your doctor for medical advice about side effects. You may report side effects to FDA at 1-800-FDA-1088. Where should I keep my medicine? This does not apply. NOTE: This sheet is a summary. It may not cover all possible information. If you have questions about this  medicine, talk to your doctor, pharmacist, or health care provider.    2016, Elsevier/Gold Standard. (2011-11-26 13:54:04)

## 2016-08-25 LAB — HIV ANTIBODY (ROUTINE TESTING W REFLEX): HIV: NONREACTIVE

## 2016-08-25 LAB — WET PREP, GENITAL: TRICH WET PREP: NONE SEEN

## 2016-08-25 LAB — GC/CHLAMYDIA PROBE AMP (~~LOC~~) NOT AT ARMC
Chlamydia: NEGATIVE
Neisseria Gonorrhea: NEGATIVE

## 2016-08-25 LAB — RPR

## 2016-08-25 LAB — GLUCOSE TOLERANCE, 1 HOUR (50G) W/O FASTING: GLUCOSE, 1 HR, GESTATIONAL: 98 mg/dL (ref <140)

## 2016-08-26 ENCOUNTER — Other Ambulatory Visit: Payer: Self-pay | Admitting: Obstetrics & Gynecology

## 2016-08-26 ENCOUNTER — Telehealth: Payer: Self-pay

## 2016-08-26 MED ORDER — METRONIDAZOLE 500 MG PO TABS: 500.0000 mg | ORAL_TABLET | Freq: Two times a day (BID) | ORAL | 0 refills | Status: DC

## 2016-08-26 NOTE — Telephone Encounter (Signed)
Patient has BV and has been informed of results. [facility] [address]

## 2016-09-07 ENCOUNTER — Ambulatory Visit (INDEPENDENT_AMBULATORY_CARE_PROVIDER_SITE_OTHER): Payer: Self-pay | Admitting: Student

## 2016-09-07 VITALS — BP 106/55 | HR 81 | Wt 136.0 lb

## 2016-09-07 DIAGNOSIS — Z3400 Encounter for supervision of normal first pregnancy, unspecified trimester: Secondary | ICD-10-CM

## 2016-09-07 DIAGNOSIS — Z3403 Encounter for supervision of normal first pregnancy, third trimester: Secondary | ICD-10-CM

## 2016-09-07 NOTE — Patient Instructions (Signed)

## 2016-09-07 NOTE — Progress Notes (Signed)
Breastfeeding discussed with patient  

## 2016-09-07 NOTE — Progress Notes (Signed)
   PRENATAL VISIT NOTE  Subjective:  Blima RichFrida Carrick is a 20 y.o. G1P0 at 873w5d being seen today for ongoing prenatal care.  She is currently monitored for the following issues for this low-risk pregnancy and has Supervision of normal pregnancy, antepartum and UTI in pregnancy, antepartum on her problem list.  Patient reports no complaints.  Contractions: Not present. Vag. Bleeding: None.  Movement: Present. Denies leaking of fluid.   The following portions of the patient's history were reviewed and updated as appropriate: allergies, current medications, past family history, past medical history, past social history, past surgical history and problem list. Problem list updated.  Objective:   Vitals:   09/07/16 1301  BP: (!) 106/55  Pulse: 81  Weight: 136 lb (61.7 kg)    Fetal Status: Fetal Heart Rate (bpm): 126   Movement: Present    Fundal height 30 cm  General:  Alert, oriented and cooperative. Patient is in no acute distress.  Skin: Skin is warm and dry. No rash noted.   Cardiovascular: Normal heart rate noted  Respiratory: Normal respiratory effort, no problems with respiration noted  Abdomen: Soft, gravid, appropriate for gestational age. Pain/Pressure: Present     Pelvic:  Cervical exam deferred        Extremities: Normal range of motion.  Edema: None  Mental Status: Normal mood and affect. Normal behavior. Normal judgment and thought content.   Assessment and Plan:  Pregnancy: G1P0 at 6273w5d  1. Supervision of normal first pregnancy, antepartum   Preterm labor symptoms and general obstetric precautions including but not limited to vaginal bleeding, contractions, leaking of fluid and fetal movement were reviewed in detail with the patient. Please refer to After Visit Summary for other counseling recommendations.  Return in about 2 weeks (around 09/21/2016) for Routine OB.  Judeth HornErin Story Vanvranken, NP

## 2016-09-21 ENCOUNTER — Ambulatory Visit (INDEPENDENT_AMBULATORY_CARE_PROVIDER_SITE_OTHER): Payer: Self-pay | Admitting: Obstetrics and Gynecology

## 2016-09-21 ENCOUNTER — Encounter: Payer: Self-pay | Admitting: Obstetrics and Gynecology

## 2016-09-21 VITALS — BP 112/63 | HR 77 | Wt 135.4 lb

## 2016-09-21 DIAGNOSIS — O2610 Low weight gain in pregnancy, unspecified trimester: Secondary | ICD-10-CM | POA: Insufficient documentation

## 2016-09-21 DIAGNOSIS — Z3400 Encounter for supervision of normal first pregnancy, unspecified trimester: Secondary | ICD-10-CM

## 2016-09-21 DIAGNOSIS — O2613 Low weight gain in pregnancy, third trimester: Secondary | ICD-10-CM

## 2016-09-21 LAB — POCT URINALYSIS DIP (DEVICE)
BILIRUBIN URINE: NEGATIVE
Glucose, UA: NEGATIVE mg/dL
HGB URINE DIPSTICK: NEGATIVE
KETONES UR: NEGATIVE mg/dL
NITRITE: NEGATIVE
PH: 7 (ref 5.0–8.0)
Protein, ur: NEGATIVE mg/dL
Specific Gravity, Urine: 1.02 (ref 1.005–1.030)
Urobilinogen, UA: 0.2 mg/dL (ref 0.0–1.0)

## 2016-09-21 MED ORDER — ONDANSETRON 4 MG PO TBDP: 4.0000 mg | ORAL_TABLET | Freq: Four times a day (QID) | ORAL | 0 refills | Status: DC | PRN

## 2016-09-21 NOTE — Progress Notes (Signed)
Discussed weight gain is less than recommended. States still having nausea and vomiting .  Discussed has poor appetite. Discussed eating 3 small meals and 3 small snacks. Discussed supplements. Has Wic appointment soon- will discuss supplements with them.

## 2016-09-21 NOTE — Progress Notes (Signed)
Subjective:  Monica [name] is a 20 y.o. G1P0 at 724w5d being seen today for ongoing prenatal care.  She is currently monitored for the following issues for this low-risk pregnancy and has Supervision of normal pregnancy, antepartum and Low maternal weight gain on her problem list.  Patient reports no complaints.  Contractions: Irregular. Vag. Bleeding: None.  Movement: Present. Denies leaking of fluid.   The following portions of the patient's history were reviewed and updated as appropriate: allergies, current medications, past family history, past medical history, past social history, past surgical history and problem list. Problem list updated.  Objective:   Vitals:   09/21/16 1107  BP: 112/63  Pulse: 77  Weight: 135 lb 6.4 oz (61.4 kg)    Fetal Status: Fetal Heart Rate (bpm): 150   Movement: Present     General:  Alert, oriented and cooperative. Patient is in no acute distress.  Skin: Skin is warm and dry. No rash noted.   Cardiovascular: Normal heart rate noted  Respiratory: Normal respiratory effort, no problems with respiration noted  Abdomen: Soft, gravid, appropriate for gestational age. Pain/Pressure: Present     Pelvic:  Cervical exam deferred        Extremities: Normal range of motion.  Edema: None  Mental Status: Normal mood and affect. Normal behavior. Normal judgment and thought content.   Urinalysis: Urine Protein: Negative Urine Glucose: Negative  Assessment and Plan:  Pregnancy: G1P0 at 1024w5d  1. Supervision of normal first pregnancy, antepartum   2. Low weight gain during pregnancy in third trimester UA today, no ketones Some N/V at times RX for Zofran sent to pharmacy Diet modification reviewed with pt. - US MFM OB FOLLOW UP; Future  Preterm labor symptoms and general obstetric precautions including but not limited to vaginal bleeding, contractions, leaking of fluid and fetal movement were reviewed in detail with the patient. Please refer to  After Visit Summary for other counseling recommendations.  No Follow-up on file.   Hermina StaggersMichael L Fleeta Kunde, MD

## 2016-09-21 NOTE — Patient Instructions (Signed)
Náuseas matinales  (Morning Sickness)  Se denominan náuseas matinales a las ganas de vomitar (náuseas) durante el embarazo. Esta sensación puede estar acompañada o no de vómitos. Aparecen por la mañana, pero puede ser un problema a lo largo de todo el día. Las náuseas matinales son más frecuentes durante el primer trimestre, pero pueden continuar durante todo el embarazo. Aunque son molestas, generalmente no causan ningún daño, excepto que presente vómitos continuos e intensos (hiperemesis gravídica). Este problema requiere un tratamiento más intenso.   CAUSAS   La causa de las náuseas matinales no se conoce completamente pero estas parecen estar relacionadas con los cambios hormonales normales que ocurren durante el embarazo.  FACTORES DE RIESGO  Usted tendrá mayor riesgo si:  · Tenía náuseas o vómitos antes de quedar embarazada.  · Tuvo náuseas matinales durante los embarazos previos.  · Está embarazada de más de un bebé, por ejemplo mellizos.  TRATAMIENTO   No utilice ningún medicamento (recetado, de venta libre ni herbario) para este problema sin consultar con su médico. El médico también podrá recetar o recomendar:  · Suplementos de vitamina B6.  · Medicamentos para las nauseas  · La medicina herbal llamada jengibre.  INSTRUCCIONES PARA EL CUIDADO EN EL HOGAR   · Tome solo medicamentos de venta libre o recetados, según las indicaciones del médico.  · Tomar un multivitamínico antes de quedar embarazada puede prevenir o disminuir la gravedad de las náuseas matinales en la mayoría de las mujeres.  · Coma un trozo de tostada seca o galletas sin sal antes de levantarse de la cama por la mañana.  · Coma 5 o 6 comidas pequeñas por día.  · Consuma alimentos blandos y secos (arroz, papas asadas). Los alimentos ricos en carbohidratos generalmente ayudan.  · No beba líquidos con las comidas. Tome líquidos entre las comidas.  · Evite los alimentos muy grasos y condimentados.  · Pídale a otra persona que cocine para usted  si el olor de algún alimento le provoca náuseas o vómitos.  · Si tiene ganas de vomitar después de tomar las vitaminas prenatales, tómelas a la noche o con una colación.  · Tome colaciones de alimentos proteicos (frutos secos, yogur, queso) entre comidas si siente apetito.  · Coma gelatina sin azúcar de postre.  · Una pulsera de acupresión (que se utiliza para mareos en viajes) puede ser de utilidad.  · La acupuntura puede ayudarla.  · No fume.  · Consiga un humidificador para mantener el aire de su casa libre de olores.  · Trate de respirar aire fresco.  SOLICITE ATENCIÓN MÉDICA SI:   · Los remedios caseros no funcionan y necesita medicamentos.  · Se siente mareada o sufre un desmayo.  · Pierde peso.  SOLICITE ATENCIÓN MÉDICA DE INMEDIATO SI:   · Tiene náuseas y vómitos de manera persistente y no puede controlarlos.  · Pierde el conocimiento (se desmaya).  ASEGÚRESE DE QUE:  · Comprende estas instrucciones.  · Controlará su afección.  · Recibirá ayuda de inmediato si no mejora o si empeora.     Esta información no tiene como fin reemplazar el consejo del médico. Asegúrese de hacerle al médico cualquier pregunta que tenga.     Document Released: 02/11/2009 Document Revised: 10/31/2013  Elsevier Interactive Patient Education ©2016 Elsevier Inc.

## 2016-09-28 ENCOUNTER — Other Ambulatory Visit: Payer: Self-pay | Admitting: Obstetrics and Gynecology

## 2016-09-28 ENCOUNTER — Ambulatory Visit (HOSPITAL_COMMUNITY)
Admission: RE | Admit: 2016-09-28 | Discharge: 2016-09-28 | Disposition: A | Discharge status: Home / Self Care | Payer: Self-pay | Source: Ambulatory Visit | Attending: Obstetrics and Gynecology | Admitting: Obstetrics and Gynecology

## 2016-09-28 DIAGNOSIS — Z3A33 33 weeks gestation of pregnancy: Secondary | ICD-10-CM

## 2016-09-28 DIAGNOSIS — O2613 Low weight gain in pregnancy, third trimester: Secondary | ICD-10-CM

## 2016-09-28 DIAGNOSIS — O283 Abnormal ultrasonic finding on antenatal screening of mother: Secondary | ICD-10-CM | POA: Insufficient documentation

## 2016-10-05 ENCOUNTER — Encounter: Payer: Self-pay | Admitting: Certified Nurse Midwife

## 2016-10-05 ENCOUNTER — Ambulatory Visit (INDEPENDENT_AMBULATORY_CARE_PROVIDER_SITE_OTHER): Payer: Self-pay | Admitting: Certified Nurse Midwife

## 2016-10-05 VITALS — BP 111/65 | HR 60 | Wt 139.9 lb

## 2016-10-05 DIAGNOSIS — O2613 Low weight gain in pregnancy, third trimester: Secondary | ICD-10-CM

## 2016-10-05 DIAGNOSIS — Z3490 Encounter for supervision of normal pregnancy, unspecified, unspecified trimester: Secondary | ICD-10-CM

## 2016-10-05 NOTE — Progress Notes (Signed)
Subjective:  Monica [name] is a 20 y.o. G1P0 at 4741w5d being seen today for ongoing prenatal care.  She is currently monitored for the following issues for this low-risk pregnancy and has Supervision of normal pregnancy, antepartum and Low maternal weight gain on her problem list.  Patient reports no complaints.  Contractions: Irregular. Vag. Bleeding: None.  Movement: Present. Denies leaking of fluid.   The following portions of the patient's history were reviewed and updated as appropriate: allergies, current medications, past family history, past medical history, past social history, past surgical history and problem list. Problem list updated.  Objective:   Vitals:   10/05/16 1544  BP: 111/65  Pulse: 60  Weight: 139 lb 14.4 oz (63.5 kg)    Fetal Status: Fetal Heart Rate (bpm): 135 Fundal Height: 34 cm Movement: Present  Presentation: Vertex  General:  Alert, oriented and cooperative. Patient is in no acute distress.  Skin: Skin is warm and dry. No rash noted.   Cardiovascular: Normal heart rate noted  Respiratory: Normal respiratory effort, no problems with respiration noted  Abdomen: Soft, gravid, appropriate for gestational age. Pain/Pressure: Present     Pelvic: Vag. Bleeding: None     Cervical exam deferred        Extremities: Normal range of motion.  Edema: None  Mental Status: Normal mood and affect. Normal behavior. Normal judgment and thought content.   Urinalysis:      Assessment and Plan:  Pregnancy: G1P0 at 2841w5d  1. Encounter for supervision of normal pregnancy, antepartum, unspecified gravidity - GBS next visit  2. Low weight gain during pregnancy in third trimester - good interval weight gain  - nutrition improving with antiemetics - recommend boost or protein shakes  Preterm labor symptoms and general obstetric precautions including but not limited to vaginal bleeding, contractions, leaking of fluid and fetal movement were reviewed in detail with  the patient. Please refer to After Visit Summary for other counseling recommendations.  Return in about 2 weeks (around 10/19/2016).   Donette LarryMelanie Marg Macmaster, CNM

## 2016-10-19 ENCOUNTER — Ambulatory Visit (INDEPENDENT_AMBULATORY_CARE_PROVIDER_SITE_OTHER): Payer: Self-pay | Admitting: Advanced Practice Midwife

## 2016-10-19 ENCOUNTER — Encounter: Payer: Self-pay | Admitting: Advanced Practice Midwife

## 2016-10-19 VITALS — BP 114/78 | HR 107 | Wt 140.9 lb

## 2016-10-19 DIAGNOSIS — Z3403 Encounter for supervision of normal first pregnancy, third trimester: Secondary | ICD-10-CM

## 2016-10-19 DIAGNOSIS — R42 Dizziness and giddiness: Secondary | ICD-10-CM

## 2016-10-19 MED ORDER — MECLIZINE HCL 12.5 MG PO TABS: 12.5000 mg | ORAL_TABLET | Freq: Two times a day (BID) | ORAL | Status: DC | PRN

## 2016-10-19 NOTE — Progress Notes (Signed)
   PRENATAL VISIT NOTE  Subjective:  Monica [name] is a 20 y.o. G1P0 at 5073w5d being seen today for ongoing prenatal care.  She is currently monitored for the following issues for this low-risk pregnancy and has Supervision of normal pregnancy, antepartum and Low maternal weight gain on her problem list.  Patient reports occasional contractions and vaginal discharge and dizziness/room spinning/lightheaded.  Contractions: Irregular. Vag. Bleeding: None.  Movement: Present. Denies leaking of fluid.   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 Feels light headed but room also spins. Over past week or so.  No congestion.  Eats every 2-4 hours and drinks well she says. Nervous about labor. Has more vaginal discharge lately but does not soak through clothes.  The following portions of the patient's history were reviewed and updated as appropriate: allergies, current medications, past family history, past medical history, past social history, past surgical history and problem list. Problem list updated.  Objective:   Vitals:   10/19/16 1415  BP: 114/78  Pulse: (!) 107  Weight: 140 lb 13.9 oz (63.9 kg)   Will get orthostaticVS  Fetal Status: Fetal Heart Rate (bpm): 137   Movement: Present     General:  Alert, oriented and cooperative. Patient is in no acute distress.  Skin: Skin is warm and dry. No rash noted.   Cardiovascular: Normal heart rate noted  Respiratory: Normal respiratory effort, no problems with respiration noted  Abdomen: Soft, gravid, appropriate for gestational age. Pain/Pressure: Present     Pelvic:  Cervical exam performed        Extremities: Normal range of motion.  Edema: None  Mental Status: Normal mood and affect. Normal behavior. Normal judgment and thought content.   Assessment and Plan:  Pregnancy: G1P0 at 7773w5d  1. Supervision of low-risk first pregnancy, third trimester      Labor process discussed in detail      Was with  friend last week for IOL, so knows a little about labor      1 pound weight gain. States is eating better  - Culture, beta strep (group b only) - GC[facility]Chlamydia probe amp ([place]  2. Vertigo      Suspect symptoms reflect more vertigo than lightheadedness      Will rx Meclizine low dose for PRN use  3. Dizziness      Will get orthostatic VS today Orthostatic VS within normal limits, doubt dehydration.      Encouraged frequent small meals and fluids  Preterm labor symptoms and general obstetric precautions including but not limited to vaginal bleeding, contractions, leaking of fluid and fetal movement were reviewed in detail with the patient. Please refer to After Visit Summary for other counseling recommendations.  Return in about 1 week (around 10/26/2016) for [facility].   Aviva SignsMarie L Katheen Aslin, CNM

## 2016-10-19 NOTE — Patient Instructions (Addendum)
Third Trimester of Pregnancy The third trimester is from week 29 through week 42, months 7 through 9. This trimester is when your unborn baby (fetus) is growing very fast. At the end of the ninth month, the unborn baby is about 20 inches in length. It weighs about 6-10 pounds. Follow these instructions at home:  Avoid all smoking, herbs, and alcohol. Avoid drugs not approved by your doctor.  Do not use any tobacco products, including cigarettes, chewing tobacco, and electronic cigarettes. If you need help quitting, ask your doctor. You may get counseling or other support to help you quit.  Only take medicine as told by your doctor. Some medicines are safe and some are not during pregnancy.  Exercise only as told by your doctor. Stop exercising if you start having cramps.  Eat regular, healthy meals.  Wear a good support bra if your breasts are tender.  Do not use hot tubs, steam rooms, or saunas.  Wear your seat belt when driving.  Avoid raw meat, uncooked cheese, and liter boxes and soil used by cats.  Take your prenatal vitamins.  Take 1500-2000 milligrams of calcium daily starting at the 20th week of pregnancy until you deliver your baby.  Try taking medicine that helps you poop (stool softener) as needed, and if your doctor approves. Eat more fiber by eating fresh fruit, vegetables, and whole grains. Drink enough fluids to keep your pee (urine) clear or pale yellow.  Take warm water baths (sitz baths) to soothe pain or discomfort caused by hemorrhoids. Use hemorrhoid cream if your doctor approves.  If you have puffy, bulging veins (varicose veins), wear support hose. Raise (elevate) your feet for 15 minutes, 3-4 times a day. Limit salt in your diet.  Avoid heavy lifting, wear low heels, and sit up straight.  Rest with your legs raised if you have leg cramps or low back pain.  Visit your dentist if you have not gone during your pregnancy. Use a soft toothbrush to brush your  teeth. Be gentle when you floss.  You can have sex (intercourse) unless your doctor tells you not to.  Do not travel far distances unless you must. Only do so with your doctor's approval.  Take prenatal classes.  Practice driving to the hospital.  Pack your hospital bag.  Prepare the baby's room.  Go to your doctor visits. Get help if:  You are not sure if you are in labor or if your water has broken.  You are dizzy.  You have mild cramps or pressure in your lower belly (abdominal).  You have a nagging pain in your belly area.  You continue to feel sick to your stomach (nauseous), throw up (vomit), or have watery poop (diarrhea).  You have bad smelling fluid coming from your vagina.  You have pain with peeing (urination). Get help right away if:  You have a fever.  You are leaking fluid from your vagina.  You are spotting or bleeding from your vagina.  You have severe belly cramping or pain.  You lose or gain weight rapidly.  You have trouble catching your breath and have chest pain.  You notice sudden or extreme puffiness (swelling) of your face, hands, ankles, feet, or legs.  You have not felt the baby move in over an hour.  You have severe headaches that do not go away with medicine.  You have vision changes. This information is not intended to replace advice given to you by your health care provider. Make   sure you discuss any questions you have with your health care provider. Document Released: 01/20/2010 Document Revised: 04/02/2016 Document Reviewed: 12/27/2012 Elsevier Interactive Patient Education  2017 [employer] Inc[employer]  Dizziness Dizziness is a common problem. It makes you feel unsteady or lightheaded. You may feel like you are about to pass out (faint). Dizziness can lead to injury if you stumble or fall. Anyone can get dizzy, but dizziness is more common in older adults. This condition can be caused by a number of things,  including:  Medicines.  Dehydration.  Illness. Follow these instructions at home: Following these instructions may help with your condition: Eating and drinking  Drink enough fluid to keep your pee (urine) clear or pale yellow. This helps to keep you from getting dehydrated. Try to drink more clear fluids, such as water.  Do not drink alcohol.  Limit how much caffeine you drink or eat if told by your doctor.  Limit how much salt you drink or eat if told by your doctor. Activity  Avoid making quick movements.  When you stand up from sitting in a chair, steady yourself until you feel okay.  In the morning, first sit up on the side of the bed. When you feel okay, stand slowly while you hold onto something. Do this until you know that your balance is fine.  Move your legs often if you need to stand in one place for a long time. Tighten and relax your muscles in your legs while you are standing.  Do not drive or use heavy machinery if you feel dizzy.  Avoid bending down if you feel dizzy. Place items in your home so that they are easy for you to reach without leaning over. Lifestyle  Do not use any tobacco products, including cigarettes, chewing tobacco, or electronic cigarettes. If you need help quitting, ask your doctor.  Try to lower your stress level, such as with yoga or meditation. Talk with your doctor if you need help. General instructions  Watch your dizziness for any changes.  Take medicines only as told by your doctor. Talk with your doctor if you think that your dizziness is caused by a medicine that you are taking.  Tell a friend or a family member that you are feeling dizzy. If he or she notices any changes in your behavior, have this person call your doctor.  Keep all follow-up visits as told by your doctor. This is important. Contact a doctor if:  Your dizziness does not go away.  Your dizziness or light-headedness gets worse.  You feel sick to your  stomach (nauseous).  You have trouble hearing.  You have new symptoms.  You are unsteady on your feet or you feel like the room is spinning. Get help right away if:  You throw up (vomit) or have diarrhea and are unable to eat or drink anything.  You have trouble:  Talking.  Walking.  Swallowing.  Using your arms, hands, or legs.  You feel generally weak.  You are not thinking clearly or you have trouble forming sentences. It may take a friend or family member to notice this.  You have:  Chest pain.  Pain in your belly (abdomen).  Shortness of breath.  Sweating.  Your vision changes.  You are bleeding.  You have a headache.  You have neck pain or a stiff neck.  You have a fever. This information is not intended to replace advice given to you by your health care provider. Make sure you  discuss any questions you have with your health care provider. Document Released: 10/15/2011 Document Revised: 04/02/2016 Document Reviewed: 10/22/2014 Elsevier Interactive Patient Education  2017 [employer]

## 2016-10-19 NOTE — Progress Notes (Signed)
36 wk cultures today  Orthostatic BP's: Lying 111/67  81        Sitting 107/56   75         Standing  118/70  92

## 2016-10-21 LAB — GC/CHLAMYDIA PROBE AMP (~~LOC~~) NOT AT ARMC
Chlamydia: NEGATIVE
Neisseria Gonorrhea: NEGATIVE

## 2016-10-22 ENCOUNTER — Inpatient Hospital Stay (HOSPITAL_COMMUNITY)
Admission: AD | Admit: 2016-10-22 | Discharge: 2016-10-24 | DRG: 775 | Disposition: A | Discharge status: Home / Self Care | Payer: Medicaid Other | Source: Ambulatory Visit | Attending: Obstetrics and Gynecology | Admitting: Obstetrics and Gynecology

## 2016-10-22 ENCOUNTER — Encounter (HOSPITAL_COMMUNITY): Payer: Self-pay | Admitting: *Deleted

## 2016-10-22 DIAGNOSIS — O99824 Streptococcus B carrier state complicating childbirth: Secondary | ICD-10-CM | POA: Diagnosis present

## 2016-10-22 DIAGNOSIS — O4202 Full-term premature rupture of membranes, onset of labor within 24 hours of rupture: Secondary | ICD-10-CM | POA: Diagnosis present

## 2016-10-22 DIAGNOSIS — Z3A37 37 weeks gestation of pregnancy: Secondary | ICD-10-CM

## 2016-10-22 DIAGNOSIS — Z87891 Personal history of nicotine dependence: Secondary | ICD-10-CM | POA: Diagnosis not present

## 2016-10-22 DIAGNOSIS — Z3490 Encounter for supervision of normal pregnancy, unspecified, unspecified trimester: Secondary | ICD-10-CM

## 2016-10-22 LAB — TYPE AND SCREEN
ABO/RH(D): B POS
ANTIBODY SCREEN: NEGATIVE

## 2016-10-22 LAB — OB RESULTS CONSOLE GBS: STREP GROUP B AG: POSITIVE

## 2016-10-22 LAB — ABO/RH: ABO/RH(D): B POS

## 2016-10-22 LAB — COMPREHENSIVE METABOLIC PANEL
ALK PHOS: 201 U/L — AB (ref 38–126)
ALT: 20 U/L (ref 14–54)
ANION GAP: 9 (ref 5–15)
AST: 26 U/L (ref 15–41)
Albumin: 3.2 g/dL — ABNORMAL LOW (ref 3.5–5.0)
BILIRUBIN TOTAL: 0.4 mg/dL (ref 0.3–1.2)
BUN: 12 mg/dL (ref 6–20)
CALCIUM: 9.2 mg/dL (ref 8.9–10.3)
CO2: 20 mmol/L — ABNORMAL LOW (ref 22–32)
CREATININE: 0.63 mg/dL (ref 0.44–1.00)
Chloride: 107 mmol/L (ref 101–111)
Glucose, Bld: 78 mg/dL (ref 65–99)
Potassium: 3.9 mmol/L (ref 3.5–5.1)
Sodium: 136 mmol/L (ref 135–145)
TOTAL PROTEIN: 6.4 g/dL — AB (ref 6.5–8.1)

## 2016-10-22 LAB — CULTURE, BETA STREP (GROUP B ONLY)

## 2016-10-22 LAB — CBC
HCT: 34.8 % — ABNORMAL LOW (ref 36.0–46.0)
Hemoglobin: 12.4 g/dL (ref 12.0–15.0)
MCH: 29 pg (ref 26.0–34.0)
MCHC: 35.6 g/dL (ref 30.0–36.0)
MCV: 81.5 fL (ref 78.0–100.0)
PLATELETS: 215 10*3/uL (ref 150–400)
RBC: 4.27 MIL/uL (ref 3.87–5.11)
RDW: 14.2 % (ref 11.5–15.5)
WBC: 7.4 10*3/uL (ref 4.0–10.5)

## 2016-10-22 LAB — POCT FERN TEST: POCT FERN TEST: POSITIVE

## 2016-10-22 LAB — RPR: RPR Ser Ql: NONREACTIVE

## 2016-10-22 MED ORDER — LACTATED RINGERS IV SOLN: 500.0000 mL | INTRAVENOUS | Status: DC | PRN

## 2016-10-22 MED ORDER — ACETAMINOPHEN 325 MG PO TABS: 650.0000 mg | ORAL_TABLET | ORAL | Status: DC | PRN

## 2016-10-22 MED ORDER — OXYCODONE-ACETAMINOPHEN 5-325 MG PO TABS: 1.0000 | ORAL_TABLET | ORAL | Status: DC | PRN

## 2016-10-22 MED ORDER — OXYTOCIN BOLUS FROM INFUSION
500.0000 mL | Freq: Once | INTRAVENOUS | Status: AC
  Administered 2016-10-22: 500 mL via INTRAVENOUS

## 2016-10-22 MED ORDER — OXYTOCIN 40 UNITS IN LACTATED RINGERS INFUSION - SIMPLE MED
1.0000 m[IU]/min | INTRAVENOUS | Status: DC
  Administered 2016-10-22: 2 m[IU]/min via INTRAVENOUS

## 2016-10-22 MED ORDER — ONDANSETRON HCL 4 MG/2ML IJ SOLN: 4.0000 mg | Freq: Four times a day (QID) | INTRAMUSCULAR | Status: DC | PRN

## 2016-10-22 MED ORDER — PENICILLIN G POT IN DEXTROSE 60000 UNIT/ML IV SOLN
3.0000 10*6.[IU] | INTRAVENOUS | Status: DC
  Administered 2016-10-22 (×2): 3 10*6.[IU] via INTRAVENOUS
  Filled 2016-10-22 (×9): qty 50

## 2016-10-22 MED ORDER — PENICILLIN G POTASSIUM 5000000 UNITS IJ SOLR
5.0000 10*6.[IU] | Freq: Once | INTRAVENOUS | Status: AC
  Administered 2016-10-22: 5 10*6.[IU] via INTRAVENOUS
  Filled 2016-10-22: qty 5

## 2016-10-22 MED ORDER — LIDOCAINE HCL (PF) 1 % IJ SOLN
30.0000 mL | INTRAMUSCULAR | Status: DC | PRN
  Filled 2016-10-22: qty 30

## 2016-10-22 MED ORDER — OXYCODONE-ACETAMINOPHEN 5-325 MG PO TABS: 2.0000 | ORAL_TABLET | ORAL | Status: DC | PRN

## 2016-10-22 MED ORDER — IBUPROFEN 600 MG PO TABS
600.0000 mg | ORAL_TABLET | Freq: Four times a day (QID) | ORAL | Status: DC
  Administered 2016-10-22 – 2016-10-24 (×8): 600 mg via ORAL
  Filled 2016-10-22 (×8): qty 1

## 2016-10-22 MED ORDER — OXYTOCIN 40 UNITS IN LACTATED RINGERS INFUSION - SIMPLE MED
2.5000 [IU]/h | INTRAVENOUS | Status: DC
  Administered 2016-10-22: 2.5 [IU]/h via INTRAVENOUS
  Filled 2016-10-22: qty 1000

## 2016-10-22 MED ORDER — FENTANYL CITRATE (PF) 100 MCG/2ML IJ SOLN
INTRAMUSCULAR | Status: DC
Start: 2016-10-22 — End: 2016-10-22
  Filled 2016-10-22: qty 2

## 2016-10-22 MED ORDER — SOD CITRATE-CITRIC ACID 500-334 MG/5ML PO SOLN: 30.0000 mL | ORAL | Status: DC | PRN

## 2016-10-22 MED ORDER — TERBUTALINE SULFATE 1 MG/ML IJ SOLN
0.2500 mg | Freq: Once | INTRAMUSCULAR | Status: DC | PRN
  Filled 2016-10-22: qty 1

## 2016-10-22 MED ORDER — FLEET ENEMA 7-19 GM/118ML RE ENEM: 1.0000 | ENEMA | RECTAL | Status: DC | PRN

## 2016-10-22 MED ORDER — FENTANYL CITRATE (PF) 100 MCG/2ML IJ SOLN: 50.0000 ug | Freq: Once | INTRAMUSCULAR | Status: DC

## 2016-10-22 MED ORDER — LACTATED RINGERS IV SOLN: INTRAVENOUS | Status: DC

## 2016-10-22 NOTE — Progress Notes (Signed)
Pt using nitrous w/some relief  Wants to try the bathtub.  Pit at 3 mu/min. Will dc and allow to get in tub.  Cx 8 per rn. FHR cat 1. Ctx q 2 minutes

## 2016-10-22 NOTE — Progress Notes (Signed)
S: Called by RN for evaluation Pt reports sudden onset of SOB with chest and upper back tightness just after IV started. Denies recent cough/cold, no hx of asthma.   O: Temp:  [97.9 F (36.6 C)] 97.9 F (36.6 C) (12/14 0732) Pulse Rate:  [83-98] 98 (12/14 0836) Resp:  [22] 22 (12/14 0732) BP: (128)/(88) 128/88 (12/14 0732) SpO2:  [96 %-99 %] 96 % (12/14 0836) Weight:  [63.5 kg (140 lb)] 63.5 kg (140 lb) (12/14 0724) GENERAL: Well-developed, well-nourished female, intermittently coughing LUNGS: Effort normal, CTAB, spontaneous cough with inspiration  Heart: tachy with regular rhythm SKIN: Warm, dry and without erythema PSYCH: Normal mood and affect  A/P: 37.[redacted] weeks gestation SOB Chest and back tightness Borderline HTN  o2 per NRB EKG- reviewed by Dr. Genevie AnnSchenk- no concern for acute CV complication Symptoms improved with time and o2, SOB and chest tightness resolved, some upper back tightness remains  Proceed with admit for labor/SROM Pre-e labs  Donette LarryMelanie Dionisios Ricci, CNM  10/22/2016 9:39 AM

## 2016-10-22 NOTE — H&P (Signed)
LABOR AND DELIVERY ADMISSION HISTORY AND PHYSICAL NOTE  Monica [name] is a 20 y.o. female G1P0 with IUP at 6628w1d presenting for SROM at 6am. Presented to [facility] at 7am. Came in with back pain. When IV was placed, felt coldness and pain in her chest and triggered cough. EKG was normal.   Endorses contractions.since yesterday. She reports positive fetal movement. She denies vaginal bleeding.  Prenatal History/Complications: Clinic  WOC Prenatal Labs  Dating  LMP, consistent w 418 w us Blood type: B/POS/-- (06/29 1358) B pos  Genetic Screen Quad: no increased risk Antibody:NEG (06/29 1358)Neg  Anatomic US 18 wks, ?prominent loop of bowel, otherwise nml, rescan 6 wks 07/23/2017: WNL f/u if indicated Rubella: 2.46 (06/29 1358)Immune  GTT            Third trimester:   98 RPR: NON REAC (06/29 1358) NR  Flu vaccine  08/07/16 HBsAg: NEGATIVE (06/29 1358) NEg  TDaP vaccine  08/24/16                                         HIV: NONREACTIVE (06/29 1358)   Baby Food  Breast                                           GBS: (For PCN allergy, check sensitivities)  Contraception Considering IUD Pap: n/a  Circumcision Needs to talk to dad   [facility]   Support Person  Vernona RiegerLaura (mother)     Past Medical History: Past Medical History:  Diagnosis Date  . Abdominal pain, recurrent   . Medical history non-contributory     Past Surgical History: Past Surgical History:  Procedure Laterality Date  . NO PAST SURGERIES      Obstetrical History: OB History    Gravida Para Term Preterm AB Living   1             SAB TAB Ectopic Multiple Live Births           0      Social History: Social History   Social History  . Marital status: Single    Spouse name: N/A  . Number of children: N/A  . Years of education: N/A   Social History Main Topics  . Smoking status: Former Smoker    Quit date: 01/07/2016  . Smokeless tobacco: Never Used  . Alcohol use No  . Drug use: No  .  Sexual activity: No   Other Topics Concern  . Not on file   Social History Narrative   11th grade    Family History: No family history on file.  Allergies: No Known Allergies  Facility-Administered Medications Prior to Admission  Medication Dose Route Frequency Provider Last Rate Last Dose  . meclizine (ANTIVERT) tablet 12.5 mg  12.5 mg Oral BID PRN Aviva SignsMarie L Williams, CNM    Never filled Rx   Prescriptions Prior to Admission  Medication Sig Dispense Refill Last Dose  . Prenatal Vit-Fe Fumarate-FA (PRENATAL MULTIVITAMIN) TABS tablet Take 1 tablet by mouth daily at 12 noon.   10/21/2016 at Unknown time  . ondansetron (ZOFRAN ODT) 4 MG disintegrating tablet Take 1 tablet (4 mg total) by mouth every 6 (six) hours as needed for nausea. (Patient not taking: Reported on 10/05/2016) 20 tablet 0 Not  Taking     Review of Systems   All systems reviewed and negative except as stated in HPI  Blood pressure 127/87, pulse 88, temperature 97.9 F (36.6 C), temperature source Oral, resp. rate 16, height 5\' 3"  (1.6 m), weight 140 lb (63.5 kg), last menstrual period 02/05/2016, SpO2 96 %, currently breastfeeding. General appearance: alert, cooperative and no distress Lungs: clear to auscultation bilaterally Heart: regular rate and rhythm Abdomen: soft, non-tender; bowel sounds normal Extremities: No calf swelling or tenderness Presentation: cephalic Fetal monitoring: 145. Moderate variability. No decels Uterine activity: q1-3 Dilation: 3 Effacement (%): 70 Station: -3 Exam by:: Morrison Oldee Carter RN   Prenatal labs: ABO, Rh: B/POS/-- (06/29 1358) Antibody: NEG (06/29 1358) Rubella: Immune RPR: NON REAC (10/16 0908)  HBsAg: NEGATIVE (06/29 1358)  HIV: NONREACTIVE (10/16 0908)  GBS:  positive  1 hr Glucola: 98 Genetic screening:  nml Anatomy US: 18 wks,? prominent loop of bowel, otherwise nml, rescan 6 wks 07/23/2017: WNL f/u if indicated  Prenatal Transfer Tool  Maternal Diabetes:  No Genetic Screening: Normal Maternal Ultrasounds/Referrals: Normal Fetal Ultrasounds or other Referrals:  None Maternal Substance Abuse:  No Significant Maternal Medications:  None Significant Maternal Lab Results: Lab values include: Group B Strep positive  Results for orders placed or performed during the hospital encounter of 10/22/16 (from the past 24 hour(s))  Fern Test   Collection Time: 10/22/16  8:11 AM  Result Value Ref Range   POCT Fern Test Positive = ruptured amniotic membanes   CBC   Collection Time: 10/22/16  8:32 AM  Result Value Ref Range   WBC 7.4 4.0 - 10.5 K/uL   RBC 4.27 3.87 - 5.11 MIL/uL   Hemoglobin 12.4 12.0 - 15.0 g/dL   HCT 21.334.8 (L) 08.636.0 - 57.846.0 %   MCV 81.5 78.0 - 100.0 fL   MCH 29.0 26.0 - 34.0 pg   MCHC 35.6 30.0 - 36.0 g/dL   RDW 46.914.2 62.911.5 - 52.815.5 %   Platelets 215 150 - 400 K/uL    Patient Active Problem List   Diagnosis Date Noted  . Low maternal weight gain 09/21/2016  . Supervision of normal pregnancy, antepartum 05/07/2016    Assessment: Monica [name] is a 20 y.o. G1P0 at 6954w1d here for SROM  #Labor: progressing well without augmentation, anticipate NSVD #Pain: Will request IV pain meds. Does not desire epidural, but wants it as an option if pain becomes too severe. #FWB: Cat I #ID:  GBS pos. PCN G #MOF: breast #MOC:undecided #Circ:  no  Clearance Cootsndrew Tyson 10/22/2016, 9:43 AM  OB FELLOW HISTORY AND PHYSICAL ATTESTATION  I have seen and examined this patient; I agree with above documentation in the resident's note.    Ernestina Pennaicholas Aoki Wedemeyer 10/22/2016, 5:39 PM

## 2016-10-22 NOTE — Progress Notes (Signed)
Patient ID: Monica [name], female   DOB: 09/25/1996, 20 y.o.   MRN: [id number]   LABOR PROGRESS NOTE  Monica RichFrida Schweizer is a 20 y.o. G1P0 at 6443w1d  admitted for SROM.  Subjective: Patient comfortable but requesting something to augment labor.  Objective: BP 109/80   Pulse (!) 127   Temp 98.4 F (36.9 C) (Oral)   Resp 20   Ht 5\' 3"  (1.6 m)   Wt 140 lb (63.5 kg)   LMP 02/05/2016 (Approximate)   SpO2 96%   Breastfeeding? Yes   BMI 24.80 kg/m  or  Vitals:   10/22/16 1212 10/22/16 1500 10/22/16 1505 10/22/16 1507  BP:   (!) 150/127 109/80  Pulse:   67 (!) 127  Resp: 18 20    Temp: 98.4 F (36.9 C)     TempSrc: Oral     SpO2:      Weight:      Height:         Dilation: 5.5 Effacement (%): 80 Cervical Position: Middle Station: -2 Presentation: Vertex Exam by:: M.Merrill, RN  Labs: Lab Results  Component Value Date   WBC 7.4 10/22/2016   HGB 12.4 10/22/2016   HCT 34.8 (L) 10/22/2016   MCV 81.5 10/22/2016   PLT 215 10/22/2016    Patient Active Problem List   Diagnosis Date Noted  . Normal labor 10/22/2016  . Low maternal weight gain 09/21/2016  . Supervision of normal pregnancy, antepartum 05/07/2016    Assessment / Plan: 20 y.o. G1P0 at 143w1d here for SROM  Labor: will augment with pitocin 2x2 Fetal Wellbeing:  Cat I Pain Control:  May request IV pain meds Anticipated MOD:  NSVD  Clearance CootsAndrew Jarrick Fjeld, MD 10/22/2016, 3:27 PM

## 2016-10-22 NOTE — Progress Notes (Signed)
Notified Belenda CruiseHeather Mitchell, Charge RN on birthing suites that patient will be coming up to room 170.  VSS.  Pt reports feeling no pain in chest, pain in back getting better.  Resident MD okay with patient being transported to room 170 on birthing suites.

## 2016-10-22 NOTE — Progress Notes (Signed)
Donette LarryMelanie Bhambri, CNM called to patient's room at [phone]. Pt states feeling difficulty breathing after IVF with LR started.  SpO2 98% on RA, HR 80s.  IVF turned off.  O2 10L via non rebreather started and patient states feeling a little better.  SpO2 99%.  HR 92.  EKG ordered by Donette LarryMelanie Bhambri, CNM.  Pt states she is feeling a little better.  FHT 150s.

## 2016-10-22 NOTE — MAU Note (Signed)
Pt.states water broke 0630, clear fluuid and is contracting

## 2016-10-22 NOTE — Anesthesia Pain Management Evaluation Note (Signed)
  CRNA Pain Management Visit Note  Patient: Monica [name], 20 y.o., female  "Hello I am a member of the anesthesia team at [facility]. We have an anesthesia team available at all times to provide care throughout the hospital, including epidural management and anesthesia for C-section. I don't know your plan for the delivery whether it a natural birth, water birth, IV sedation, nitrous supplementation, doula or epidural, but we want to meet your pain goals."   1.Was your pain managed to your expectations on prior hospitalizations?   No prior hospitalizations  2.What is your expectation for pain management during this hospitalization?     IV pain meds and Nitrous Oxide  3.How can we help you reach that goal? N2O when I ask For it.  Record the patient's initial score and the patient's pain goal.   Pain: 7  Pain Goal: 8 [facility] wants you to be able to say your pain was always managed very well.  Trena Dunavan 10/22/2016

## 2016-10-23 ENCOUNTER — Encounter (HOSPITAL_COMMUNITY): Payer: Self-pay

## 2016-10-23 MED ORDER — METHYLERGONOVINE MALEATE 0.2 MG PO TABS: 0.2000 mg | ORAL_TABLET | ORAL | Status: DC | PRN

## 2016-10-23 MED ORDER — ONDANSETRON HCL 4 MG PO TABS: 4.0000 mg | ORAL_TABLET | ORAL | Status: DC | PRN

## 2016-10-23 MED ORDER — ACETAMINOPHEN 325 MG PO TABS: 650.0000 mg | ORAL_TABLET | ORAL | Status: DC | PRN

## 2016-10-23 MED ORDER — ONDANSETRON HCL 4 MG/2ML IJ SOLN: 4.0000 mg | INTRAMUSCULAR | Status: DC | PRN

## 2016-10-23 MED ORDER — MEASLES, MUMPS & RUBELLA VAC ~~LOC~~ INJ
0.5000 mL | INJECTION | Freq: Once | SUBCUTANEOUS | Status: DC
  Filled 2016-10-23: qty 0.5

## 2016-10-23 MED ORDER — PRENATAL MULTIVITAMIN CH
1.0000 | ORAL_TABLET | Freq: Every day | ORAL | Status: DC
  Administered 2016-10-23 – 2016-10-24 (×2): 1 via ORAL
  Filled 2016-10-23 (×2): qty 1

## 2016-10-23 MED ORDER — OXYCODONE HCL 5 MG PO TABS: 10.0000 mg | ORAL_TABLET | ORAL | Status: DC | PRN

## 2016-10-23 MED ORDER — DIPHENHYDRAMINE HCL 25 MG PO CAPS: 25.0000 mg | ORAL_CAPSULE | Freq: Four times a day (QID) | ORAL | Status: DC | PRN

## 2016-10-23 MED ORDER — COCONUT OIL OIL
1.0000 "application " | TOPICAL_OIL | Status: DC | PRN
  Administered 2016-10-24: 1 via TOPICAL
  Filled 2016-10-23: qty 120

## 2016-10-23 MED ORDER — OXYTOCIN 40 UNITS IN LACTATED RINGERS INFUSION - SIMPLE MED: 2.5000 [IU]/h | INTRAVENOUS | Status: DC | PRN

## 2016-10-23 MED ORDER — METHYLERGONOVINE MALEATE 0.2 MG/ML IJ SOLN: 0.2000 mg | INTRAMUSCULAR | Status: DC | PRN

## 2016-10-23 MED ORDER — TETANUS-DIPHTH-ACELL PERTUSSIS 5-2.5-18.5 LF-MCG/0.5 IM SUSP
0.5000 mL | Freq: Once | INTRAMUSCULAR | Status: DC
  Filled 2016-10-23: qty 0.5

## 2016-10-23 MED ORDER — SIMETHICONE 80 MG PO CHEW: 80.0000 mg | CHEWABLE_TABLET | ORAL | Status: DC | PRN

## 2016-10-23 MED ORDER — SENNOSIDES-DOCUSATE SODIUM 8.6-50 MG PO TABS
2.0000 | ORAL_TABLET | ORAL | Status: DC
  Administered 2016-10-23 (×2): 2 via ORAL
  Filled 2016-10-23 (×2): qty 2

## 2016-10-23 MED ORDER — BENZOCAINE-MENTHOL 20-0.5 % EX AERO
1.0000 "application " | INHALATION_SPRAY | CUTANEOUS | Status: DC | PRN
  Administered 2016-10-23: 1 via TOPICAL
  Filled 2016-10-23: qty 56

## 2016-10-23 MED ORDER — DIBUCAINE 1 % RE OINT: 1.0000 "application " | TOPICAL_OINTMENT | RECTAL | Status: DC | PRN

## 2016-10-23 MED ORDER — WITCH HAZEL-GLYCERIN EX PADS: 1.0000 "application " | MEDICATED_PAD | CUTANEOUS | Status: DC | PRN

## 2016-10-23 MED ORDER — OXYCODONE HCL 5 MG PO TABS: 5.0000 mg | ORAL_TABLET | ORAL | Status: DC | PRN

## 2016-10-23 NOTE — Lactation Note (Signed)
This note was copied from a baby's chart. Lactation Consultation Note Initial visit with mom. Baby now 713 hours old and 37.1 Heylee Tant Several visitors present in room. Reports breast feeding is going well. Some pain with initial latch that eases off after a few minutes. Baby last fed about 1 hour ago and is asleep in visitors arms. Encouraged to page for assist at next feeding. BF brochure given No questions at present.   Patient Name: Boy Blima RichFrida Ramirez JXBJY'NToday's Date: 10/23/2016 Reason for consult: Initial assessment   Maternal Data Formula Feeding for Exclusion: No Does the patient have breastfeeding experience prior to this delivery?: No  Feeding Feeding Type: Breast Fed Length of feed: 20 min (per mom)  LATCH Score/Interventions                      Lactation Tools Discussed/Used     Consult Status Consult Status: Follow-up Date: 10/24/16 Follow-up type: In-patient    Pamelia HoitWeeks, Zamani Crocker D 10/23/2016, 11:27 AM

## 2016-10-23 NOTE — Progress Notes (Signed)
UR chart review completed.  

## 2016-10-23 NOTE — Progress Notes (Signed)
Post Partum Day 1 Subjective: no complaints, up ad lib, voiding, tolerating PO and + flatus  Objective: Blood pressure 111/68, pulse 97, temperature 99.1 F (37.3 C), temperature source Oral, resp. rate 18, height 5\' 3"  (1.6 m), weight 63.5 kg (140 lb), last menstrual period 02/05/2016, SpO2 96 %, currently breastfeeding.  Physical Exam:  General: alert, cooperative, appears stated age and no distress Lochia: appropriate Uterine Fundus: firm Incision: N/A DVT Evaluation: No evidence of DVT seen on physical exam.   Recent Labs  10/22/16 0832  HGB 12.4  HCT 34.8*    Assessment/Plan: Plan for discharge tomorrow, Breastfeeding, Lactation consult and Contraception remains undecided   LOS: 1 day   Clayton BiblesSamantha Weinhold, SNM 10/23/2016, 7:20 AM   I have seen and examined this patient and have written my own note on her

## 2016-10-24 MED ORDER — IBUPROFEN 600 MG PO TABS: 600.0000 mg | ORAL_TABLET | Freq: Four times a day (QID) | ORAL | 0 refills | Status: DC

## 2016-10-24 MED ORDER — ACETAMINOPHEN 325 MG PO TABS: 650.0000 mg | ORAL_TABLET | ORAL | 0 refills | Status: DC | PRN

## 2016-10-24 NOTE — Discharge Summary (Signed)
OB Discharge Summary     Patient Name: Monica RichFrida Berber DOB: Jul 08, 1996 MRN: [id number]  Date of admission: 10/22/2016 Delivering MD: Clayton BiblesWEINHOLD, SAMANTHA COCHRANE   Date of discharge: 10/24/2016  Admitting diagnosis: 39WKS, WATER BROKE Intrauterine pregnancy: 7063w1d     Secondary diagnosis:  Active Problems:   Normal labor  Additional problems: none     Discharge diagnosis: Term Pregnancy Delivered                                                                                                Post partum procedures:none  Augmentation: Pitocin  Complications: None  Hospital course:  Onset of Labor With Vaginal Delivery     20 y.o. yo G1P1001 at 5763w1d was admitted in Latent Labor on 10/22/2016. Patient had an uncomplicated labor course as follows:  Membrane Rupture Time/Date: 6:30 AM ,10/22/2016   Intrapartum Procedures: Episiotomy: None [1]                                         Lacerations:  2nd degree [3];Periurethral [8]  Patient had a delivery of a Viable infant. 10/22/2016  Information for the patient's newborn:  Bridgette HabermannHernandezRamirez, Boy Murlean [[id number]][[id number]]  Delivery Method: Vaginal, Spontaneous Delivery (Filed from Delivery Summary)    Pateint had an uncomplicated postpartum course.  She is ambulating, tolerating a regular diet, passing flatus, and urinating well. Patient is discharged home in stable condition on 10/24/16.    Physical exam  Vitals:   10/23/16 0813 10/23/16 1316 10/23/16 1820 10/24/16 0500  BP: 116/70 116/65 112/74 (!) 105/55  Pulse: 77 86 83 (!) 57  Resp: 16 20 19 19   Temp: 98.5 F (36.9 C) 98.7 F (37.1 C) 97.6 F (36.4 C) 98 F (36.7 C)  TempSrc: Oral Oral Axillary Oral  SpO2: 100% 99%    Weight:      Height:       General: alert, cooperative and no distress Lochia: appropriate Uterine Fundus: firm Incision: N/A DVT Evaluation: No evidence of DVT seen on physical exam. No significant calf/ankle edema. Labs: Lab Results   Component Value Date   WBC 7.4 10/22/2016   HGB 12.4 10/22/2016   HCT 34.8 (L) 10/22/2016   MCV 81.5 10/22/2016   PLT 215 10/22/2016   CMP Latest Ref Rng & Units 10/22/2016  Glucose 65 - 99 mg/dL 78  BUN 6 - 20 mg/dL 12  Creatinine 6.290.44 - 5.281.00 mg/dL 4.130.63  Sodium 244135 - 010145 mmol/L 136  Potassium 3.5 - 5.1 mmol/L 3.9  Chloride 101 - 111 mmol/L 107  CO2 22 - 32 mmol/L 20(L)  Calcium 8.9 - 10.3 mg/dL 9.2  Total Protein 6.5 - 8.1 g/dL 6.4(L)  Total Bilirubin 0.3 - 1.2 mg/dL 0.4  Alkaline Phos 38 - 126 U/L 201(H)  AST 15 - 41 U/L 26  ALT 14 - 54 U/L 20    Discharge instruction: per After Visit Summary and "Baby and Me Booklet".  After visit meds:  Allergies as of 10/24/2016   No  Known Allergies     Medication List    STOP taking these medications   ondansetron 4 MG disintegrating tablet Commonly known as:  ZOFRAN ODT     TAKE these medications   acetaminophen 325 MG tablet Commonly known as:  TYLENOL Take 2 tablets (650 mg total) by mouth every 4 (four) hours as needed (for pain scale < 4).   ibuprofen 600 MG tablet Commonly known as:  ADVIL,MOTRIN Take 1 tablet (600 mg total) by mouth every 6 (six) hours.   prenatal multivitamin Tabs tablet Take 1 tablet by mouth daily at 12 noon.       Diet: routine diet  Activity: Advance as tolerated. Pelvic rest for 6 weeks.   Outpatient follow up:6 weeks Follow up Appt:No future appointments. Follow up Visit:No Follow-up on file.  Postpartum contraception: Undecided  Newborn Data: Live born female  Birth Weight: 6 lb 7 oz (2920 g) APGAR: 8, 9  Baby Feeding: Breast Disposition:home with mother   10/24/2016 Ernestina PennaNicholas Elvera Almario, MD

## 2016-10-24 NOTE — Lactation Note (Signed)
This note was copied from a baby's chart. Lactation Consultation Note  Patient Name: Boy Blima RichFrida Mcmath ZOXWR'UToday's Date: 10/24/2016 Reason for consult: Follow-up assessment   With this mom of an early term baby, now 5239 hours old and 37 3/7 weeks CGA. The baby has had more than adequate wet and dirty diapers. Mom has easily expressed colostrum. The baby was dressed in shirt, pants, socks, wrapped in thick cotton blanket, and then covered in thick, fluffy blanket. I reviewed with mom how overheating the baby can increase the risk of sids, and how the baby should require one more layer of clothing/blanket than we need. Mom reports some discomfort with latching. Mom allowed me to show her how to latch the baby with cross cradle hold, as opposed to cradle, to obtain a deeper latch. Baby was too sleepy to suckle at this time. I also reviewed engorgement / breast care, and encouraged mom to call lactation for any questions/conerns. I also gave mom a hand pump to take home, and instructed her in it's sue and care.    Maternal Data    Feeding Length of feed: 10 min  LATCH Score/Interventions Latch: Too sleepy or reluctant, no latch achieved, no sucking elicited.  Audible Swallowing:  (mom has easily expressed transitional milk/colostrum)  Type of Nipple: Everted at rest and after stimulation  Comfort (Breast/Nipple): Soft / non-tender  Problem noted: Mild/Moderate discomfort (with latch) Interventions (Mild/moderate discomfort): Reverse pressue;Hand expression;Post-pump        Lactation Tools Discussed/Used     Consult Status Consult Status: Complete Follow-up type: Call as needed    Alfred LevinsLee, Kaslyn Richburg Anne 10/24/2016, 1:05 PM

## 2016-10-24 NOTE — Discharge Instructions (Signed)

## 2016-10-25 ENCOUNTER — Ambulatory Visit: Payer: Self-pay

## 2016-10-25 NOTE — Lactation Note (Signed)
This note was copied from a baby's chart. Lactation Consultation Note  Patient Name: Monica [name] BJYNW'GToday's Date: 10/25/2016 Reason for consult: Follow-up assessment;Infant < 6lbs;Other (Comment) (early term) Baby just coming off breast, asleep as LC arrived. Baby has been to breast 10 times in past 24 hours for 10-30 minutes, supplement X 2 with 10 ml of formula. Good output. 7.7% weight loss, now 60 hours old. Per Newt Newborn weight loss in 50-75%. Mom reports she has Peds f/u tomorrow. Discussed with Mom baby should be at breast 8-12 times in 24 hours and with feeding ques. Try to keep baby actively nursing for 15-20 minutes both breasts when possible. Encouraged to post pump for 15 minutes to encourage milk production and to have EBM to supplement till weight loss stabilizes. Advised to pump at least 4-6 times per day and give baby back any amount of EBM received.  Mom's breasts are starting to fill. Engorgement care reviewed if needed. Advised of OP Services and support group. [facility]  Maternal Data    Feeding Feeding Type: Breast Fed Length of feed: 20 min  LATCH Score/Interventions                      Lactation Tools Discussed/Used     Consult Status Consult Status: Follow-up Date: 10/25/16 Follow-up type: In-patient    Alfred LevinsGranger, Cathrine Krizan Ann 10/25/2016, 10:00 AM

## 2016-10-25 NOTE — Lactation Note (Signed)
This note was copied from a baby's chart. Lactation Consultation Note  Patient Name: Monica [name] Reason for consult: Follow-up assessment;Infant < 6lbs With further chart review, LC notes baby only lost 1.7% weight in past 24 hours. Discussing with Mom feeding plan, it was decided Mom would use Manual Hand Pump [facility]. Mom also has access to DEBP thru family member at home. Encouraged Mom to keep BF 8-12 times and with feeding ques, keep baby nursing both breasts when possible 15-20 minutes.  When baby will only BF 1 breast, pump other breast and give baby back EBM received. Also discussed post pumping 4 times/day to encourage milk production and to have EBM to supplement till weight loss stabilizes. Did not get to see feeding. Mom to call for questions/concerns.   Maternal Data    Feeding Feeding Type: Breast Fed Length of feed: 20 min  LATCH Score/Interventions                      Lactation Tools Discussed/Used     Consult Status Consult Status: Complete Date: 10/25/16 Follow-up type: In-patient    Monica [name], Monica [name] [name], 10:26 AM

## 2016-10-27 ENCOUNTER — Encounter: Payer: Self-pay | Admitting: Student

## 2016-11-30 ENCOUNTER — Encounter: Payer: Self-pay | Admitting: Advanced Practice Midwife

## 2016-11-30 ENCOUNTER — Ambulatory Visit (INDEPENDENT_AMBULATORY_CARE_PROVIDER_SITE_OTHER): Payer: Self-pay | Admitting: Advanced Practice Midwife

## 2016-11-30 DIAGNOSIS — Z659 Problem related to unspecified psychosocial circumstances: Secondary | ICD-10-CM | POA: Insufficient documentation

## 2016-11-30 DIAGNOSIS — Z609 Problem related to social environment, unspecified: Secondary | ICD-10-CM | POA: Insufficient documentation

## 2016-11-30 NOTE — Patient Instructions (Signed)
Contraception Choices Birth control (contraception) is the use of any methods or devices to stop pregnancy from happening. Below are some methods to help avoid pregnancy. Hormonal birth control  A small tube put under the skin of the upper arm (implant). The tube can stay in place for 3 years. The implant must be taken out after 3 years.  Shots given every 3 months.  Pills taken every day.  Patches that are changed once a week.  A ring put into the vagina (vaginal ring). The ring is left in place for 3 weeks and removed for 1 week. Then, a new ring is put in the vagina.  Emergency birth control pills taken after unprotected sex (intercourse). Barrier birth control  A thin covering worn on the penis (female condom) during sex.  A soft, loose covering put into the vagina (female condom) before sex.  A rubber bowl that sits over the cervix (diaphragm). The bowl must be made for you. The bowl is put into the vagina before sex. The bowl is left in place for 6 to 8 hours after sex.  A small, soft cup that fits over the cervix (cervical cap). The cup must be made for you. The cup can be left in place for 48 hours after sex.  A sponge that is put into the vagina before sex.  A chemical that kills or stops sperm from getting into the cervix and uterus (spermicide). The chemical may be a cream, jelly, foam, or pill. Intrauterine (IUD) birth control  IUD birth control is a small, T-shaped piece of plastic. The plastic is put inside the uterus. There are 2 types of IUD:  Copper IUD. The IUD is covered in copper wire. The copper makes a fluid that kills sperm. It can stay in place for 10 years.  Hormone IUD. The hormone stops pregnancy from happening. It can stay in place for 5 years. Permanent methods  When the woman has her fallopian tubes sealed, tied, or blocked during surgery. This stops the egg from traveling to the uterus.  The doctor places a small coil or insert into each fallopian  tube. This causes scar tissue to form and blocks the fallopian tubes.  When the female has the tubes that carry sperm tied off (vasectomy). Natural family planning birth control  Natural family planning means not having sex or using barrier birth control on the days the woman could become pregnant.  Use a calendar to keep track of the length of each period and know the days she can get pregnant.  Avoid sex during ovulation.  Use a thermometer to measure body temperature. Also watch for symptoms of ovulation.  Time sex to be after the woman has ovulated. Use condoms to help protect yourself against sexually transmitted infections (STIs). Do this no matter what type of birth control you use. Talk to your doctor about which type of birth control is best for you. This information is not intended to replace advice given to you by your health care provider. Make sure you discuss any questions you have with your health care provider. Document Released: 08/23/2009 Document Revised: 04/02/2016 Document Reviewed: 05/17/2013 Elsevier Interactive Patient Education  2017 Elsevier Inc.  

## 2016-11-30 NOTE — Progress Notes (Signed)
Subjective:     Monica [name] is a 21 y.o. female who presents for a postpartum visit. She is 5 weeks postpartum following a delivery 10/22/2016 . I have fully reviewed the prenatal and intrapartum course. The delivery was at 37 gestational weeks. Outcome: vaginal delivery. Anesthesia: none. Postpartum course has been uncomplicated. Baby's course has been uncomplicated. Baby is feeding by . Bleeding small amount. Bowel function is normal. Bladder function is normal. Patient is sexually active. Contraception method is none at this time but desires Nexplanon at a later time. Postpartum depression screening: negative  The following portions of the patient's history were reviewed and updated as appropriate: allergies, current medications, past family history, past medical history, past social history, past surgical history and problem list.  Review of Systems Pertinent items are noted in HPI.   Objective:    Wt 125 lb 1.6 oz (56.7 kg)   BMI 22.16 kg/m   General:  alert, cooperative and no distress   Breasts:  inspection negative, no nipple discharge or bleeding, no masses or nodularity palpable  Lungs: clear to auscultation bilaterally  Heart:  regular rate and rhythm, S1, S2 normal, no murmur, click, rub or gallop  Abdomen: soft, non-tender; bowel sounds normal; no masses,  no organomegaly  Uterus well involuted   Vulva:  not evaluated  Vagina: not evaluated  Cervix:  not evaluated  Corpus: not examined  Adnexa:  not evaluated  Rectal Exam: Not performed.        Assessment:     Normal  postpartum exam. Pap smear not done at today's visit.   Plan:    1. Contraception: Considering DepoProvera, [facility] 2. Abstinent for now as FOB is in jail for 5 months 3. Follow up in: 1 year or as needed.

## 2016-12-06 ENCOUNTER — Ambulatory Visit: Payer: Self-pay

## 2016-12-06 NOTE — Lactation Note (Signed)
This note was copied from a baby's chart. Lactation Consultation Note  Patient Name: Monica [name] ZOXWR'UToday's Date: 12/06/2016 Reason for consult: Other (Comment) (telephone consult to Peds) Baby was admitted to Peds last night due to URI. Baby's nurse, Danford BadKristie called Lactation for consultation on baby. RN stated baby has only been feeding for 4-5 mins at a time and that mom has not been responding to baby's cues. RN stated that they gave baby Pedialyte after baby was only at breast for ~4 mins. RN stated that mom reports usually she is BF q 2-3 hrs for 15 mins. Baby had not had a wet diaper all night but had one ~10am today. Lactation consulted with Dr Ezequiel EssexGable about use of Pedialyte, who stated it is sometimes used for acute illnesses instead of using IV fluids.  Lactation called nurse back and encouraged her to start mom pumping with DEBP and to supplement with this if baby does not feed long at the breast. Lactation then talked with mom on phone, who stated that since baby has been sick, he has not wanted to stay at the breast very long but usually he feeds q 2-3 hrs for 15 mins. Mom reports she has a hand pump at home and uses it occasionally. Discussed goal of BF for at least 15-20 mins at a feeding and that if baby doesn't, she should pump with DEBP for 15 mins. Mom has WIC so encouraged mom to call WIC if she needs to continue pumping with DEBP after discharge from hospital. Mom reports no questions at this time.  Maternal Data    Feeding Feeding Type: Other (comment) (Pedialyte in bottle) Length of feed: 4 min  LATCH Score/Interventions                      Lactation Tools Discussed/Used     Consult Status Consult Status: PRN    Oneal GroutLaura C Makendra Vigeant 12/06/2016, 1:16 PM

## 2017-05-14 ENCOUNTER — Ambulatory Visit (HOSPITAL_COMMUNITY)
Admission: EM | Admit: 2017-05-14 | Discharge: 2017-05-14 | Disposition: A | Discharge status: Home / Self Care | Payer: Self-pay | Attending: Internal Medicine | Admitting: Internal Medicine

## 2017-05-14 ENCOUNTER — Encounter (HOSPITAL_COMMUNITY): Payer: Self-pay | Admitting: Family Medicine

## 2017-05-14 DIAGNOSIS — B373 Candidiasis of vulva and vagina: Secondary | ICD-10-CM | POA: Insufficient documentation

## 2017-05-14 DIAGNOSIS — Z87891 Personal history of nicotine dependence: Secondary | ICD-10-CM | POA: Insufficient documentation

## 2017-05-14 DIAGNOSIS — B3731 Acute candidiasis of vulva and vagina: Secondary | ICD-10-CM

## 2017-05-14 LAB — POCT PREGNANCY, URINE: Preg Test, Ur: NEGATIVE

## 2017-05-14 LAB — POCT URINALYSIS DIP (DEVICE)
BILIRUBIN URINE: NEGATIVE
GLUCOSE, UA: NEGATIVE mg/dL
KETONES UR: NEGATIVE mg/dL
LEUKOCYTES UA: NEGATIVE
Nitrite: NEGATIVE
Protein, ur: NEGATIVE mg/dL
Urobilinogen, UA: 0.2 mg/dL (ref 0.0–1.0)
pH: 5.5 (ref 5.0–8.0)

## 2017-05-14 MED ORDER — FLUCONAZOLE 200 MG PO TABS: ORAL_TABLET | ORAL | 0 refills | Status: DC

## 2017-05-14 NOTE — Discharge Instructions (Signed)
Based on your signs, symptoms, it sounds like he likely have a yeast infection. Retesting your urine to confirm, starting on Diflucan, take one tablet today than wait 3 days and take the second tablet, if there are any significant findings on your urine, you'll be called and notified in 3-5 business days

## 2017-05-14 NOTE — ED Triage Notes (Signed)
Pt here for dysuria. sts that she had drug screening a week ago and was told her urine looked abnormal and worried she had a UTI.

## 2017-05-14 NOTE — ED Provider Notes (Signed)
CSN: [id number]     Arrival date & time 05/14/17  1118 History   None    No chief complaint on file.  (Consider location/radiation/quality/duration/timing/severity/associated sxs/prior Treatment) The history is provided by the patient.  Dysuria  Pain quality:  Burning Pain severity:  Mild Onset quality:  Gradual Duration:  5 days Timing:  Constant Progression:  Unchanged Chronicity:  New Recent urinary tract infections: no   Relieved by:  Nothing Worsened by:  Nothing Ineffective treatments:  None tried Urinary symptoms: foul-smelling urine   Urinary symptoms: no frequent urination, no hematuria, no hesitancy and no bladder incontinence   Associated symptoms: vaginal discharge   Associated symptoms: no abdominal pain, no flank pain, no genital lesions, no nausea and no vomiting   Vaginal discharge:    Quality:  Thick and white   Severity:  Moderate   Onset quality:  Gradual   Duration:  5 days   Timing:  Constant   Progression:  Unchanged   Chronicity:  New Risk factors: sexually active   Risk factors: not pregnant and no sexually transmitted infections     Past Medical History:  Diagnosis Date  . Abdominal pain, recurrent   . Medical history non-contributory    Past Surgical History:  Procedure Laterality Date  . NO PAST SURGERIES     History reviewed. No pertinent family history. Social History  Substance Use Topics  . Smoking status: Former Smoker    Quit date: 01/07/2016  . Smokeless tobacco: Never Used  . Alcohol use No   OB History    Gravida Para Term Preterm AB Living   1 1 1     1    SAB TAB Ectopic Multiple Live Births         0 1     Review of Systems  Constitutional: Negative.   HENT: Negative.   Respiratory: Negative.   Cardiovascular: Negative.   Gastrointestinal: Negative for abdominal pain, nausea and vomiting.  Genitourinary: Positive for dysuria and vaginal discharge. Negative for flank pain.  Musculoskeletal: Negative.   Skin:  Negative.     Allergies  Patient has no known allergies.  Home Medications   Prior to Admission medications   Medication Sig Start Date End Date Taking? Authorizing Provider  fluconazole (DIFLUCAN) 200 MG tablet Take one tablet today, wait 3 days, take the second tablet 05/14/17   Dorena BodoKennard, Kj Imbert, NP  Pseudoeph-Doxylamine-DM-APAP (NYQUIL D COLD/FLU PO) Take by mouth.    [provider]   Meds Ordered and Administered this Visit  Medications - No data to display  BP 108/60   Pulse 77   Temp 98.3 F (36.8 C)   Resp 18   SpO2 98%  No data found.   Physical Exam  Constitutional: She is oriented to person, place, and time. She appears well-developed and well-nourished. No distress.  HENT:  Head: Normocephalic and atraumatic.  Right Ear: External ear normal.  Left Ear: External ear normal.  Eyes: Conjunctivae are normal.  Genitourinary:  Genitourinary Comments: Deferred, urine cytology obtained  Neurological: She is alert and oriented to person, place, and time.  Skin: Skin is warm and dry. Capillary refill takes less than 2 seconds. No rash noted. She is not diaphoretic. No erythema.  Psychiatric: She has a normal mood and affect. Her behavior is normal.  Nursing note and vitals reviewed.   Urgent Care Course     Procedures (including critical care time)  Labs Review Labs Reviewed  POCT URINALYSIS DIP (DEVICE) - Abnormal; Notable  for the following:       Result Value   Hgb urine dipstick TRACE (*)    All other components within normal limits  POCT PREGNANCY, URINE  URINE CYTOLOGY ANCILLARY ONLY    Imaging Review No results found.      MDM   1. Yeast vaginitis    Urine analysis does not clearly indicate UTI, signs and symptoms more consistent with yeast vaginitis, treating empirically with Diflucan, urine cytology obtained testing for gonorrhea, chlamydia, Trichomonas, yeast, BV. We'll notify if any positive results in 3-5 business days     Dorena Bodo, NP 05/14/17 1310

## 2017-05-17 LAB — URINE CYTOLOGY ANCILLARY ONLY
Chlamydia: NEGATIVE
Neisseria Gonorrhea: NEGATIVE
Trichomonas: NEGATIVE

## 2017-05-19 LAB — URINE CYTOLOGY ANCILLARY ONLY
Bacterial vaginitis: NEGATIVE
Candida vaginitis: NEGATIVE

## 2017-09-09 LAB — CYTOLOGY - PAP: Pap: NEGATIVE

## 2017-10-04 ENCOUNTER — Encounter (HOSPITAL_COMMUNITY): Payer: Self-pay | Admitting: Emergency Medicine

## 2017-10-04 ENCOUNTER — Other Ambulatory Visit: Payer: Self-pay

## 2017-10-04 DIAGNOSIS — Z5321 Procedure and treatment not carried out due to patient leaving prior to being seen by health care provider: Secondary | ICD-10-CM | POA: Insufficient documentation

## 2017-10-04 DIAGNOSIS — R112 Nausea with vomiting, unspecified: Secondary | ICD-10-CM | POA: Insufficient documentation

## 2017-10-04 MED ORDER — ONDANSETRON 4 MG PO TBDP
4.0000 mg | ORAL_TABLET | Freq: Once | ORAL | Status: AC | PRN
  Administered 2017-10-04: 4 mg via ORAL
  Filled 2017-10-04: qty 1

## 2017-10-04 NOTE — ED Triage Notes (Signed)
Pt reports N/V/D abd cramping since this AM. Pt dry heaving in triage.

## 2017-10-05 ENCOUNTER — Emergency Department (HOSPITAL_COMMUNITY)
Admission: EM | Admit: 2017-10-05 | Discharge: 2017-10-05 | Disposition: A | Discharge status: Home / Self Care | Payer: Self-pay | Attending: Emergency Medicine | Admitting: Emergency Medicine

## 2017-10-05 LAB — COMPREHENSIVE METABOLIC PANEL
ALT: 17 U/L (ref 14–54)
AST: 22 U/L (ref 15–41)
Albumin: 4 g/dL (ref 3.5–5.0)
Alkaline Phosphatase: 87 U/L (ref 38–126)
Anion gap: 9 (ref 5–15)
BUN: 9 mg/dL (ref 6–20)
CHLORIDE: 103 mmol/L (ref 101–111)
CO2: 23 mmol/L (ref 22–32)
Calcium: 9 mg/dL (ref 8.9–10.3)
Creatinine, Ser: 0.81 mg/dL (ref 0.44–1.00)
Glucose, Bld: 105 mg/dL — ABNORMAL HIGH (ref 65–99)
POTASSIUM: 3.5 mmol/L (ref 3.5–5.1)
SODIUM: 135 mmol/L (ref 135–145)
Total Bilirubin: 0.5 mg/dL (ref 0.3–1.2)
Total Protein: 7.3 g/dL (ref 6.5–8.1)

## 2017-10-05 LAB — CBC
HEMATOCRIT: 37.1 % (ref 36.0–46.0)
Hemoglobin: 12 g/dL (ref 12.0–15.0)
MCH: 24.9 pg — AB (ref 26.0–34.0)
MCHC: 32.3 g/dL (ref 30.0–36.0)
MCV: 77.1 fL — AB (ref 78.0–100.0)
Platelets: 303 10*3/uL (ref 150–400)
RBC: 4.81 MIL/uL (ref 3.87–5.11)
RDW: 17.5 % — ABNORMAL HIGH (ref 11.5–15.5)
WBC: 9.7 10*3/uL (ref 4.0–10.5)

## 2017-10-05 LAB — I-STAT BETA HCG BLOOD, ED (MC, WL, AP ONLY): I-stat hCG, quantitative: <5 m[IU]/mL (ref <5)

## 2017-10-05 LAB — LIPASE, BLOOD: LIPASE: 22 U/L (ref 11–51)

## 2017-10-05 NOTE — ED Notes (Signed)
Pt observed leaving ED 

## 2017-11-09 NOTE — L&D Delivery Note (Signed)
OB/[facility]N Faculty Practice Delivery Note  Stann OreFrida Hernandez Ramirez is a 22 y.o. G2P1001 s/p SVD at 7752w4d. She was admitted for spontaneous onset of preterm labor, SROM.  ROM: 15h 6128m with light meconium fluid GBS Status: positive Maximum Maternal Temperature: Temp (24hrs), Avg:98.3 F (36.8 C), Min:97.9 F (36.6 C), Max:98.5 F (36.9 C)  Labor Progress: . Admitted with active preterm labor, SROM with amnisure positive  . Unchanged after about 6 hours, started pitocin for augmentation . AROM of forebag  Delivery Date/Time: 10/12/18 at 1955 Delivery: Called to room and patient was complete and pushing. Head delivered LOA. No nuchal cord present. Shoulder and body delivered in usual fashion. Infant with spontaneous cry, placed on mother's abdomen, dried and stimulated. Notable size of gastroschisis - covered with lap, cord clamped x 2 by team and passed to NICU team. Cord blood drawn. Placenta delivered spontaneously with gentle cord traction. Fundus firm with massage and Pitocin. Labia, perineum, vagina, and cervix inspected inspected with no lacerations.   Placenta: spontaneous, intact, 3-vessel cord with accessory lobe not attached - will send to pathology  Complications: gastroschisis, light meconium Lacerations: none EBL: 100cc Analgesia: epidural   Postpartum Planning [x]  message to sent to schedule follow-up  [x]  vaccines UTD  Infant: vigorous female  APGARs (not entered but appeared vigorous, breathing spontaneously, NICU present for resuscitation)  weight pending  Deashia Soule S. Earlene PlaterWallace, DO OB/GYN Fellow, Faculty Practice

## 2018-01-18 ENCOUNTER — Ambulatory Visit (HOSPITAL_COMMUNITY)
Admission: EM | Admit: 2018-01-18 | Discharge: 2018-01-18 | Disposition: A | Discharge status: Home / Self Care | Payer: Self-pay | Attending: Family Medicine | Admitting: Family Medicine

## 2018-01-18 ENCOUNTER — Encounter (HOSPITAL_COMMUNITY): Payer: Self-pay | Admitting: Family Medicine

## 2018-01-18 DIAGNOSIS — Z87891 Personal history of nicotine dependence: Secondary | ICD-10-CM | POA: Insufficient documentation

## 2018-01-18 DIAGNOSIS — N898 Other specified noninflammatory disorders of vagina: Secondary | ICD-10-CM | POA: Insufficient documentation

## 2018-01-18 DIAGNOSIS — Z3202 Encounter for pregnancy test, result negative: Secondary | ICD-10-CM

## 2018-01-18 LAB — POCT URINALYSIS DIP (DEVICE)
Bilirubin Urine: NEGATIVE
Glucose, UA: NEGATIVE mg/dL
Ketones, ur: 80 mg/dL — AB
LEUKOCYTES UA: NEGATIVE
NITRITE: NEGATIVE
PH: 7 (ref 5.0–8.0)
Protein, ur: NEGATIVE mg/dL
Specific Gravity, Urine: 1.02 (ref 1.005–1.030)
UROBILINOGEN UA: 1 mg/dL (ref 0.0–1.0)

## 2018-01-18 LAB — POCT PREGNANCY, URINE: PREG TEST UR: NEGATIVE

## 2018-01-18 MED ORDER — AZITHROMYCIN 250 MG PO TABS
1000.0000 mg | ORAL_TABLET | Freq: Once | ORAL | Status: AC
  Administered 2018-01-18: 1000 mg via ORAL

## 2018-01-18 MED ORDER — FLUCONAZOLE 150 MG PO TABS: 150.0000 mg | ORAL_TABLET | Freq: Once | ORAL | 0 refills | Status: AC

## 2018-01-18 MED ORDER — CEFTRIAXONE SODIUM 250 MG IJ SOLR
INTRAMUSCULAR | Status: AC
  Filled 2018-01-18: qty 250

## 2018-01-18 MED ORDER — CEFTRIAXONE SODIUM 250 MG IJ SOLR
250.0000 mg | Freq: Once | INTRAMUSCULAR | Status: AC
  Administered 2018-01-18: 250 mg via INTRAMUSCULAR

## 2018-01-18 MED ORDER — AZITHROMYCIN 250 MG PO TABS
ORAL_TABLET | ORAL | Status: AC
  Filled 2018-01-18: qty 4

## 2018-01-18 NOTE — Discharge Instructions (Signed)
We have treated you today for gonorrhea and chlamydia, with rocephin and azithromycin. Please refrain from sexual intercourse for 7 days while medicines eliminating infection.  I have sent in 2 tablets of Diflucan to treat a yeast infection.  Please take 1 tablet once, the other tablet and 3 days if still having symptoms.  We are testing you for Gonorrhea, Chlamydia, Trichomonas, Yeast and Bacterial Vaginosis. We will call you if anything is positive and let you know if you require any further treatment. Please inform partners of any positive results.   Please return if symptoms not improving with treatment, development of fever, nausea, vomiting, abdominal pain.

## 2018-01-18 NOTE — ED Triage Notes (Signed)
Pt here for 2 weeks of vaginal discharge, itching and irritation with foul odor. Worry for STDs. No urinary complaints.

## 2018-01-18 NOTE — ED Provider Notes (Signed)
[facility]    CSN: [id number] Arrival date & time: 01/18/18  1811     History   Chief Complaint Chief Complaint  Patient presents with  . Vaginal Discharge    HPI Monica [name] is a 22 y.o. female presenting today with vaginal discharge.  Symptoms have been going on for approximately 2 weeks.  She initially was her normal discharge after her menstrual cycle stopped.  Initially was white, has changed into a yellowish green color with an associated odor.  She has had occasional lower abdominal pain.  Also associated dysuria and itching.  States that the burning sensation is located on the outside versus inside the urethra.  Last menstrual period was 2/25, patient is not on any form of birth control.  She denies any new partners, but states that her boyfriend cheated on her recently.  Requesting empiric treatment.  Denies fever, nausea, vomiting, back pain.  Denies pelvic pain.  Denies any new rashes, lesions or bumps.  HPI  Past Medical History:  Diagnosis Date  . Abdominal pain, recurrent   . Medical history non-contributory     Patient Active Problem List   Diagnosis Date Noted  . Social problem 11/30/2016    Past Surgical History:  Procedure Laterality Date  . APPENDECTOMY    . NO PAST SURGERIES      OB History    Gravida Para Term Preterm AB Living   1 1 1     1    SAB TAB Ectopic Multiple Live Births         0 1       Home Medications    Prior to Admission medications   Medication Sig Start Date End Date Taking? Authorizing Provider  fluconazole (DIFLUCAN) 150 MG tablet Take 1 tablet (150 mg total) by mouth once for 1 dose. 01/18/18 01/18/18  Wieters, Hallie C, PA-C  Pseudoeph-Doxylamine-DM-APAP (NYQUIL D COLD/FLU PO) Take by mouth.    [provider]    Family History History reviewed. No pertinent family history.  Social History Social History   Tobacco Use  . Smoking status: Former Smoker    Last attempt to quit:  01/07/2016    Years since quitting: 2.0  . Smokeless tobacco: Never Used  Substance Use Topics  . Alcohol use: No  . Drug use: No     Allergies   Patient has no known allergies.   Review of Systems Review of Systems  Constitutional: Negative for fever.  Respiratory: Negative for shortness of breath.   Cardiovascular: Negative for chest pain.  Gastrointestinal: Positive for abdominal pain. Negative for diarrhea, nausea and vomiting.  Genitourinary: Positive for dysuria and vaginal discharge. Negative for flank pain, genital sores, hematuria, menstrual problem, vaginal bleeding and vaginal pain.  Musculoskeletal: Negative for back pain.  Skin: Negative for rash.  Neurological: Negative for dizziness, light-headedness and headaches.     Physical Exam Triage Vital Signs ED Triage Vitals [01/18/18 1948]  Enc Vitals Group     BP 122/70     Pulse Rate 65     Resp 18     Temp 98.5 F (36.9 C)     Temp src      SpO2 100 %     Weight      Height      Head Circumference      Peak Flow      Pain Score      Pain Loc      Pain Edu?  Excl. in GC?    No data found.  Updated Vital Signs BP 122/70   Pulse 65   Temp 98.5 F (36.9 C)   Resp 18   LMP 01/03/2018   SpO2 100%   Visual Acuity Right Eye Distance:   Left Eye Distance:   Bilateral Distance:    Right Eye Near:   Left Eye Near:    Bilateral Near:     Physical Exam  Constitutional: She appears well-developed and well-nourished. No distress.  HENT:  Head: Normocephalic and atraumatic.  Eyes: Conjunctivae are normal.  Neck: Neck supple.  Cardiovascular: Normal rate and regular rhythm.  No murmur heard. Pulmonary/Chest: Effort normal and breath sounds normal. No respiratory distress.  Abdominal: Soft. There is no tenderness.  Nontender to light and deep palpation  Genitourinary:  Genitourinary Comments: Pelvic exam deferred given patient without new rashes or lesions, no pelvic pain.  Musculoskeletal:  She exhibits no edema.  Neurological: She is alert.  Skin: Skin is warm and dry.  Psychiatric: She has a normal mood and affect.  Nursing note and vitals reviewed.    UC Treatments / Results  Labs (all labs ordered are listed, but only abnormal results are displayed) Labs Reviewed  POCT URINALYSIS DIP (DEVICE) - Abnormal; Notable for the following components:      Result Value   Ketones, ur 80 (*)    Hgb urine dipstick TRACE (*)    All other components within normal limits  URINE CULTURE  POCT PREGNANCY, URINE  CERVICOVAGINAL ANCILLARY ONLY    EKG  EKG Interpretation None       Radiology No results found.  Procedures Procedures (including critical care time)  Medications Ordered in UC Medications  azithromycin (ZITHROMAX) tablet 1,000 mg (not administered)  cefTRIAXone (ROCEPHIN) injection 250 mg (not administered)     Initial Impression / Assessment and Plan / UC Course  I have reviewed the triage vital signs and the nursing notes.  Pertinent labs & imaging results that were available during my care of the patient were reviewed by me and considered in my medical decision making (see chart for details).     Patient with vaginal discharge. Screening for STD's performed, will call to inform of positive results. Patient requesting empiric treatment today.  Will provide Rocephin and azithromycin for chlamydia and gonorrhea.  Will treat for yeast with Diflucan given itching.  Dysuria more likely related to vaginal symptoms versus UTI.  Patient lacking fever, nausea, vomiting, abdominal pain, CMT; unlikely PID. Discussed strict return precautions. Patient verbalized understanding and is agreeable with plan.    Final Clinical Impressions(s) / UC Diagnoses   Final diagnoses:  Vaginal discharge    ED Discharge Orders        Ordered    fluconazole (DIFLUCAN) 150 MG tablet   Once     01/18/18 2005       Controlled Substance Prescriptions [place] Controlled  Substance Registry consulted? Not Applicable   Lew Dawes, [place] 01/18/18 2021

## 2018-01-20 LAB — URINE CULTURE

## 2018-01-20 LAB — CERVICOVAGINAL ANCILLARY ONLY
BACTERIAL VAGINITIS: POSITIVE — AB
Candida vaginitis: POSITIVE — AB
Chlamydia: POSITIVE — AB
NEISSERIA GONORRHEA: NEGATIVE
Trichomonas: NEGATIVE

## 2018-01-22 ENCOUNTER — Telehealth (HOSPITAL_COMMUNITY): Payer: Self-pay | Admitting: Emergency Medicine

## 2018-01-22 MED ORDER — METRONIDAZOLE 500 MG PO TABS: 500.0000 mg | ORAL_TABLET | Freq: Two times a day (BID) | ORAL | 0 refills | Status: DC

## 2018-01-22 NOTE — Telephone Encounter (Signed)
-----   Message from Isa RankinLaura Wilson Murray, MD sent at 01/21/2018  8:32 PM EDT ----- Clinical staff, please let patient and the health department know that test for chlamydia was positive.  This was treated at the urgent care visit with po zithromax 1g.  Please refrain from sexual intercourse for 7 days to give the medicine time to work.  Sexual partners need to be notified and tested/treated.  Condoms may reduce risk of reinfection.  Test for candida (yeast) was also positive.  Prescription for fluconazole was given at the urgent care visit   Test for gardnerella (bacterial vaginosis) was also positive.  This only needs to be treated if there are persistent symptoms, such as vaginal irritation/discharge.   If these symptoms are present, ok to send rx for metronidazole 500mg  bid x 7d #14 no refills or metronidazole vaginal gel 0.75% 1 applicatorful bid x 7d #14 no refills.  Urine culture was positive for a very small number of Strep germs, not meeting criteria for a UTI. Recheck for further evaluation if symptoms are not improving. Note sent to patient's MyChart.   LM

## 2018-01-22 NOTE — Telephone Encounter (Signed)
Called pt to notify of recent lab results.... Pt ID'd properly Reports feeling better and sx are subsiding Pt is taking/tolarating well meds given at visit.  [facility] [address].  Adv pt if sx are not getting better to return or to f/u w/PCP Education on safe sex given Also adv pt to notify partner(s) [facility] Notified pt that lab results can be obtained through MyChart Pt verb understanding.

## 2018-02-25 ENCOUNTER — Encounter (HOSPITAL_COMMUNITY): Payer: Self-pay

## 2018-02-25 ENCOUNTER — Emergency Department (HOSPITAL_COMMUNITY)
Admission: EM | Admit: 2018-02-25 | Discharge: 2018-02-25 | Disposition: A | Discharge status: Home / Self Care | Payer: Self-pay | Attending: Emergency Medicine | Admitting: Emergency Medicine

## 2018-02-25 ENCOUNTER — Emergency Department (HOSPITAL_COMMUNITY): Payer: Self-pay

## 2018-02-25 ENCOUNTER — Other Ambulatory Visit: Payer: Self-pay

## 2018-02-25 DIAGNOSIS — Z3A01 Less than 8 weeks gestation of pregnancy: Secondary | ICD-10-CM | POA: Insufficient documentation

## 2018-02-25 DIAGNOSIS — O9989 Other specified diseases and conditions complicating pregnancy, childbirth and the puerperium: Secondary | ICD-10-CM | POA: Insufficient documentation

## 2018-02-25 DIAGNOSIS — Z79899 Other long term (current) drug therapy: Secondary | ICD-10-CM | POA: Insufficient documentation

## 2018-02-25 DIAGNOSIS — R110 Nausea: Secondary | ICD-10-CM | POA: Insufficient documentation

## 2018-02-25 DIAGNOSIS — R1084 Generalized abdominal pain: Secondary | ICD-10-CM | POA: Insufficient documentation

## 2018-02-25 DIAGNOSIS — Z87891 Personal history of nicotine dependence: Secondary | ICD-10-CM | POA: Insufficient documentation

## 2018-02-25 LAB — I-STAT BETA HCG BLOOD, ED (MC, WL, AP ONLY): HCG, QUANTITATIVE: 110.7 m[IU]/mL — AB (ref <5)

## 2018-02-25 LAB — COMPREHENSIVE METABOLIC PANEL
ALK PHOS: 70 U/L (ref 38–126)
ALT: 20 U/L (ref 14–54)
AST: 21 U/L (ref 15–41)
Albumin: 4.1 g/dL (ref 3.5–5.0)
Anion gap: 8 (ref 5–15)
BILIRUBIN TOTAL: 0.7 mg/dL (ref 0.3–1.2)
BUN: 8 mg/dL (ref 6–20)
CALCIUM: 9.3 mg/dL (ref 8.9–10.3)
CO2: 25 mmol/L (ref 22–32)
CREATININE: 0.71 mg/dL (ref 0.44–1.00)
Chloride: 104 mmol/L (ref 101–111)
GFR calc non Af Amer: >60 mL/min (ref >60)
Glucose, Bld: 94 mg/dL (ref 65–99)
Potassium: 3.9 mmol/L (ref 3.5–5.1)
SODIUM: 137 mmol/L (ref 135–145)
Total Protein: 7.6 g/dL (ref 6.5–8.1)

## 2018-02-25 LAB — CBC
HCT: 37.8 % (ref 36.0–46.0)
Hemoglobin: 12.6 g/dL (ref 12.0–15.0)
MCH: 27.2 pg (ref 26.0–34.0)
MCHC: 33.3 g/dL (ref 30.0–36.0)
MCV: 81.5 fL (ref 78.0–100.0)
PLATELETS: 301 10*3/uL (ref 150–400)
RBC: 4.64 MIL/uL (ref 3.87–5.11)
RDW: 15.1 % (ref 11.5–15.5)
WBC: 8.6 10*3/uL (ref 4.0–10.5)

## 2018-02-25 LAB — URINALYSIS, ROUTINE W REFLEX MICROSCOPIC
BILIRUBIN URINE: NEGATIVE
Glucose, UA: NEGATIVE mg/dL
HGB URINE DIPSTICK: NEGATIVE
Ketones, ur: NEGATIVE mg/dL
Leukocytes, UA: NEGATIVE
Nitrite: NEGATIVE
PROTEIN: NEGATIVE mg/dL
Specific Gravity, Urine: 1.018 (ref 1.005–1.030)
pH: 6 (ref 5.0–8.0)

## 2018-02-25 LAB — LIPASE, BLOOD: Lipase: 32 U/L (ref 11–51)

## 2018-02-25 MED ORDER — PRENATAL COMPLETE 14-0.4 MG PO TABS: ORAL_TABLET | ORAL | 0 refills | Status: DC

## 2018-02-25 NOTE — ED Provider Notes (Signed)
Patient placed in Quick Look pathway, seen and evaluated   Chief Complaint: Lower abdominal pain, nausea, vomiting  HPI:   Patient presents to ED for one week history of lower abdominal cramping sensation, nausea and NBNB emesis.  Patient is sexually active and is currently on OCPs but states she missed one dose in the past month.  She denies any vaginal discharge, dysuria, vaginal bleeding.  Last menstrual cycle began on March 23.  ROS: Abdominal pain  Physical Exam:   Gen: No distress  Neuro: Awake and Alert  Skin: Warm    Focused Exam: Mild lower abdominal tenderness to palpation with no rebound or guarding present.  Does not appear dehydrated. Does not appear uncomfortable.  HCG positive at 110.  Will obtain ultrasound to r/o ectopic d/t abdominal pain.   Initiation of care has begun. The patient has been counseled on the process, plan, and necessity for staying for the completion/evaluation, and the remainder of the medical screening examination  Portions of this note were generated with Dragon dictation software. Dictation errors may occur despite best attempts at proofreading.    Dietrich PatesKhatri, Adanely Reynoso, PA-C 02/25/18 1908    Doug SouJacubowitz, Sam, MD 02/26/18 [phone]

## 2018-02-25 NOTE — ED Notes (Signed)
Pt just arrived from ultrasound

## 2018-02-25 NOTE — ED Provider Notes (Signed)
[facility] EMERGENCY DEPARTMENT Provider Note   CSN: [id number] Arrival date & time: 02/25/18  1208     History   Chief Complaint Chief Complaint  Patient presents with  . Abdominal Pain    HPI Monica [name] is a 22 y.o. female who is G2P1 who presents to the emergency department today for abdominal cramping.  Patient states that over the last 4 days she has been having generalized abdominal cramping that occurs for a few minutes randomly throughout the day and is associated with nausea without emesis.  She was seen by provider in triage with a elevated hCG of 110.  Patient reports that she is sexually active and currently on OCPs.  She states that she missed 1 dose in the last 1 month.  Her last menstrual cycle began on 01/29/2018.  She states she has not had any abdominal pain, nausea or vomiting today.  She denies any vaginal discharge, vaginal bleeding, dysuria, hematuria, urinary frequency, urinary urgency or fevers.  Patient does not currently follow with a OB/GYN.  She is not currently on prenatal vitamins.  Patient denies previous complications with last pregnancy.  No previous obstetric or gynecological surgeries.  HPI  Past Medical History:  Diagnosis Date  . Abdominal pain, recurrent   . Medical history non-contributory     Patient Active Problem List   Diagnosis Date Noted  . Social problem 11/30/2016    Past Surgical History:  Procedure Laterality Date  . APPENDECTOMY    . NO PAST SURGERIES       OB History    Gravida  1   Para  1   Term  1   Preterm      AB      Living  1     SAB      TAB      Ectopic      Multiple  0   Live Births  1            Home Medications    Prior to Admission medications   Medication Sig Start Date End Date Taking? Authorizing Provider  metroNIDAZOLE (FLAGYL) 500 MG tablet Take 1 tablet (500 mg total) by mouth 2 (two) times daily. 01/22/18   Isa Rankin, MD    Pseudoeph-Doxylamine-DM-APAP Doreatha Martin D COLD/FLU PO) Take by mouth.    [provider]    Family History History reviewed. No pertinent family history.  Social History Social History   Tobacco Use  . Smoking status: Former Smoker    Last attempt to quit: 01/07/2016    Years since quitting: 2.1  . Smokeless tobacco: Never Used  Substance Use Topics  . Alcohol use: No  . Drug use: No     Allergies   Patient has no known allergies.   Review of Systems Review of Systems  All other systems reviewed and are negative.    Physical Exam Updated Vital Signs BP 103/75   Pulse 79   Temp 98.6 F (37 C) (Oral)   Resp 16   LMP 01/29/2018   SpO2 100%   Physical Exam  Constitutional: She appears well-developed and well-nourished.  HENT:  Head: Normocephalic and atraumatic.  Right Ear: External ear normal.  Left Ear: External ear normal.  Nose: Nose normal.  Mouth/Throat: Uvula is midline, oropharynx is clear and moist and mucous membranes are normal. No tonsillar exudate.  Eyes: Pupils are equal, round, and reactive to light. Right eye exhibits no discharge. Left eye exhibits  no discharge. No scleral icterus.  Neck: Trachea normal. Neck supple. No spinous process tenderness present. No neck rigidity. Normal range of motion present.  Cardiovascular: Normal rate, regular rhythm and intact distal pulses.  No murmur heard. Pulses:      Radial pulses are 2+ on the right side, and 2+ on the left side.       Dorsalis pedis pulses are 2+ on the right side, and 2+ on the left side.       Posterior tibial pulses are 2+ on the right side, and 2+ on the left side.  No lower extremity swelling or edema. Calves symmetric in size bilaterally.  Pulmonary/Chest: Effort normal and breath sounds normal. She exhibits no tenderness.  Abdominal: Soft. Bowel sounds are normal. She exhibits no distension. There is no tenderness. There is no rigidity, no rebound, no guarding and no CVA  tenderness.  Musculoskeletal: She exhibits no edema.  Lymphadenopathy:    She has no cervical adenopathy.  Neurological: She is alert.  Skin: Skin is warm and dry. No rash noted. She is not diaphoretic.  Psychiatric: She has a normal mood and affect.  Nursing note and vitals reviewed.    ED Treatments / Results  Labs (all labs ordered are listed, but only abnormal results are displayed) Labs Reviewed  I-STAT BETA HCG BLOOD, ED ([facility], [facility], [facility] ONLY) - Abnormal; Notable for the following components:      Result Value   I-stat hCG, quantitative 110.7 (*)    All other components within normal limits  LIPASE, BLOOD  COMPREHENSIVE METABOLIC PANEL  CBC  URINALYSIS, ROUTINE W REFLEX MICROSCOPIC    EKG None  Radiology [place] Ob Comp < 14 Wks  Result Date: 02/25/2018 CLINICAL DATA:  Pregnant patient with abdominal pain for 1 week. Quantitative HCG 110.7. EXAM: OBSTETRIC <14 WK US AND TRANSVAGINAL OB US TECHNIQUE: Both transabdominal and transvaginal ultrasound examinations were performed for complete evaluation of the gestation as well as the maternal uterus, adnexal regions, and pelvic cul-de-sac. Transvaginal technique was performed to assess early pregnancy. COMPARISON:  None. FINDINGS: Intrauterine gestational sac: Not visualized. Yolk sac:  Not visualized. Embryo:  Not visualized. Cardiac Activity: Not applicable. Subchorionic hemorrhage:  None visualized. Maternal uterus/adnexae: Unremarkable. IMPRESSION: No evidence of pregnancy is identified which could be due to early gestational age. Recommend follow-up quantitative HCG. Electronically Signed   By: Drusilla Kannerhomas  Dalessio M.D.   On: 02/25/2018 20:25   [place] Ob Transvaginal  Result Date: 02/25/2018 CLINICAL DATA:  Pregnant patient with abdominal pain for 1 week. Quantitative HCG 110.7. EXAM: OBSTETRIC <14 WK US AND TRANSVAGINAL OB US TECHNIQUE: Both transabdominal and transvaginal ultrasound examinations were performed for complete evaluation of the  gestation as well as the maternal uterus, adnexal regions, and pelvic cul-de-sac. Transvaginal technique was performed to assess early pregnancy. COMPARISON:  None. FINDINGS: Intrauterine gestational sac: Not visualized. Yolk sac:  Not visualized. Embryo:  Not visualized. Cardiac Activity: Not applicable. Subchorionic hemorrhage:  None visualized. Maternal uterus/adnexae: Unremarkable. IMPRESSION: No evidence of pregnancy is identified which could be due to early gestational age. Recommend follow-up quantitative HCG. Electronically Signed   By: Drusilla Kannerhomas  Dalessio M.D.   On: 02/25/2018 20:25    Procedures Procedures (including critical care time)  Medications Ordered in ED Medications - No data to display   Initial Impression / Assessment and Plan / ED Course  I have reviewed the triage vital signs and the nursing notes.  Pertinent labs & imaging results that were available during my  care of the patient were reviewed by me and considered in my medical decision making (see chart for details).     22 y.o. G2P1 female with LMP on 01/29/18 who presents to the emergency department today for abdominal cramping & nausea without emesis x 4 days.  Patient vital signs are reassuring.  No hypotension or tachycardia.  Abdominal exam without tenderness palpation.  No peritoneal signs.  Patient's labs significant for elevated hCG of 110.7.  Provider in triage ordered for sound to rule out ectopic.  Ultrasound was unable to visualize pregnancy due to early gestational age.  Patient denies any vaginal bleeding, vaginal discharge or urinary symptoms.  Repeat abdominal exam without any tenderness.  Labs are reassuring.  No anemia.  No evidence of UTI.  I spoke with Dr. Shawnie Pons of OBGYN who recommended the patient come to the [facility]U on Sunday at 2 PM to have repeat hCG testing to ensure this is a viable pregnancy.  I instructed the patient on this on including discharge instructions.  I will start the patient on a prenatal  vitamin.  The evaluation does not show pathology that would require ongoing emergent intervention or inpatient treatment. I advised the patient to return to the emergency department with new or worsening symptoms or new concerns. Specific return precautions discussed. The patient verbalized understanding and agreement with plan. All questions answered. No further questions at this time. The patient is hemodynamically stable, mentating appropriately and appears safe for discharge.  Patient case discussed with Dr. Jacubowitz who is in agreement with plan.  Final Clinical Impressions(s) / ED Diagnoses   Final diagnoses:  Less than [redacted] weeks gestation of pregnancy    ED Discharge Orders        Ordered    Prenatal Vit-Fe Fumarate-FA (PRENATAL COMPLETE) 14-0.4 MG TABS     04 /19/19 2109       Princella Pellegrini 02/25/18 2111    Doug Sou, MD 02/26/18 [phone]

## 2018-02-25 NOTE — ED Notes (Signed)
Jeri, NA called for pt in lobby with no answer.  Stated she let the front know and told Johny DrillingChan.

## 2018-02-25 NOTE — ED Triage Notes (Signed)
Pt reports abdominal pain and being unable to eat X1 week. She describes the pain as cramping. Pt states every time she eats she feels like she is going to throw up.

## 2018-02-25 NOTE — Discharge Instructions (Addendum)
You are found today to have a elevated beta hCG of 110.7.  Ultrasound was unable to visualize if this is a viable pregnancy.  I would like you to follow-up at the MAU ([address], [place], [place] [address]) on Sunday, 02/27/2018, at [facility] to have repeat testing to ensure that your blood levels are rising as they should.  Please take prenatal vitamins as prescribed.  Please return for any vaginal bleeding, severe abdominal pain, vomiting, fainting or other concerning symptoms.

## 2018-02-25 NOTE — ED Notes (Signed)
Pt stable, ambulatory, states understanding of discharge instructions 

## 2018-02-27 ENCOUNTER — Encounter (HOSPITAL_COMMUNITY): Payer: Self-pay

## 2018-02-27 ENCOUNTER — Inpatient Hospital Stay (HOSPITAL_COMMUNITY)
Admission: AD | Admit: 2018-02-27 | Discharge: 2018-02-27 | Disposition: A | Discharge status: Home / Self Care | Payer: Self-pay | Source: Ambulatory Visit | Attending: Family Medicine | Admitting: Family Medicine

## 2018-02-27 DIAGNOSIS — Z3A00 Weeks of gestation of pregnancy not specified: Secondary | ICD-10-CM | POA: Insufficient documentation

## 2018-02-27 DIAGNOSIS — O26891 Other specified pregnancy related conditions, first trimester: Secondary | ICD-10-CM | POA: Insufficient documentation

## 2018-02-27 DIAGNOSIS — Z87891 Personal history of nicotine dependence: Secondary | ICD-10-CM | POA: Insufficient documentation

## 2018-02-27 DIAGNOSIS — O3680X0 Pregnancy with inconclusive fetal viability, not applicable or unspecified: Secondary | ICD-10-CM

## 2018-02-27 DIAGNOSIS — R109 Unspecified abdominal pain: Secondary | ICD-10-CM

## 2018-02-27 DIAGNOSIS — Z331 Pregnant state, incidental: Secondary | ICD-10-CM | POA: Insufficient documentation

## 2018-02-27 LAB — HCG, QUANTITATIVE, PREGNANCY: HCG, BETA CHAIN, QUANT, S: 321 m[IU]/mL — AB (ref <5)

## 2018-02-27 NOTE — Discharge Instructions (Signed)
US department will call you for an ultrasound appt in 2 weeks (early May)   First Trimester of Pregnancy The first trimester of pregnancy is from week 1 until the end of week 13 (months 1 through 3). During this time, your baby will begin to develop inside you. At 6-8 weeks, the eyes and face are formed, and the heartbeat can be seen on ultrasound. At the end of 12 weeks, all the baby's organs are formed. Prenatal care is all the medical care you receive before the birth of your baby. Make sure you get good prenatal care and follow all of your doctor's instructions. Follow these instructions at home: Medicines  Take over-the-counter and prescription medicines only as told by your doctor. Some medicines are safe and some medicines are not safe during pregnancy.  Take a prenatal vitamin that contains at least 600 micrograms (mcg) of folic acid.  If you have trouble pooping (constipation), take medicine that will make your stool soft (stool softener) if your doctor approves. Eating and drinking  Eat regular, healthy meals.  Your doctor will tell you the amount of weight gain that is right for you.  Avoid raw meat and uncooked cheese.  If you feel sick to your stomach (nauseous) or throw up (vomit): ? Eat 4 or 5 small meals a day instead of 3 large meals. ? Try eating a few soda crackers. ? Drink liquids between meals instead of during meals.  To prevent constipation: ? Eat foods that are high in fiber, like fresh fruits and vegetables, whole grains, and beans. ? Drink enough fluids to keep your pee (urine) clear or pale yellow. Activity  Exercise only as told by your doctor. Stop exercising if you have cramps or pain in your lower belly (abdomen) or low back.  Do not exercise if it is too hot, too humid, or if you are in a place of great height (high altitude).  Try to avoid standing for long periods of time. Move your legs often if you must stand in one place for a long  time.  Avoid heavy lifting.  Wear low-heeled shoes. Sit and stand up straight.  You can have sex unless your doctor tells you not to. Relieving pain and discomfort  Wear a good support bra if your breasts are sore.  Take warm water baths (sitz baths) to soothe pain or discomfort caused by hemorrhoids. Use hemorrhoid cream if your doctor says it is okay.  Rest with your legs raised if you have leg cramps or low back pain.  If you have puffy, bulging veins (varicose veins) in your legs: ? Wear support hose or compression stockings as told by your doctor. ? Raise (elevate) your feet for 15 minutes, 3-4 times a day. ? Limit salt in your food. Prenatal care  Schedule your prenatal visits by the twelfth week of pregnancy.  Write down your questions. Take them to your prenatal visits.  Keep all your prenatal visits as told by your doctor. This is important. Safety  Wear your seat belt at all times when driving.  Make a list of emergency phone numbers. The list should include numbers for family, friends, the hospital, and police and fire departments. General instructions  Ask your doctor for a referral to a local prenatal class. Begin classes no later than at the start of month 6 of your pregnancy.  Ask for help if you need counseling or if you need help with nutrition. Your doctor can give you advice or  tell you where to go for help.  Do not use hot tubs, steam rooms, or saunas.  Do not douche or use tampons or scented sanitary pads.  Do not cross your legs for long periods of time.  Avoid all herbs and alcohol. Avoid drugs that are not approved by your doctor.  Do not use any tobacco products, including cigarettes, chewing tobacco, and electronic cigarettes. If you need help quitting, ask your doctor. You may get counseling or other support to help you quit.  Avoid cat litter boxes and soil used by cats. These carry germs that can cause birth defects in the baby and can cause  a loss of your baby (miscarriage) or stillbirth.  Visit your dentist. At home, brush your teeth with a soft toothbrush. Be gentle when you floss. Contact a doctor if:  You are dizzy.  You have mild cramps or pressure in your lower belly.  You have a nagging pain in your belly area.  You continue to feel sick to your stomach, you throw up, or you have watery poop (diarrhea).  You have a bad smelling fluid coming from your vagina.  You have pain when you pee (urinate).  You have increased puffiness (swelling) in your face, hands, legs, or ankles. Get help right away if:  You have a fever.  You are leaking fluid from your vagina.  You have spotting or bleeding from your vagina.  You have very bad belly cramping or pain.  You gain or lose weight rapidly.  You throw up blood. It may look like coffee grounds.  You are around people who have [place] measles, fifth disease, or chickenpox.  You have a very bad headache.  You have shortness of breath.  You have any kind of trauma, such as from a fall or a car accident. Summary  The first trimester of pregnancy is from week 1 until the end of week 13 (months 1 through 3).  To take care of yourself and your unborn baby, you will need to eat healthy meals, take medicines only if your doctor tells you to do so, and do activities that are safe for you and your baby.  Keep all follow-up visits as told by your doctor. This is important as your doctor will have to ensure that your baby is healthy and growing well. This information is not intended to replace advice given to you by your health care provider. Make sure you discuss any questions you have with your health care provider. Document Released: 04/13/2008 Document Revised: 11/03/2016 Document Reviewed: 11/03/2016 Elsevier Interactive Patient Education  2017 [employer].

## 2018-02-27 NOTE — MAU Note (Signed)
Pt here for repeat hcg, no pain or bleeding.

## 2018-02-27 NOTE — MAU Note (Signed)
Pt here for follow up hcg

## 2018-02-27 NOTE — MAU Provider Note (Addendum)
Patient Monica [name] is a 22 y.o.  G2P1001 At 5550w1d here for follow-up beta hcg.   History     CSN: [id number]  Arrival date and time: 02/27/18 1355   None     Chief Complaint  Patient presents with  . Follow-up   HPI Patient is here for follow-up beta hcg after [facility]. She was found to be pregnant; her beta was 110 and no gestatational sac was seen in the uterus.  She was instructed to return to MAU on Friday to check her beta hcg levels.  OB History    Gravida  2   Para  1   Term  1   Preterm      AB      Living  1     SAB      TAB      Ectopic      Multiple  0   Live Births  1           Past Medical History:  Diagnosis Date  . Abdominal pain, recurrent   . Medical history non-contributory     Past Surgical History:  Procedure Laterality Date  . APPENDECTOMY    . NO PAST SURGERIES      No family history on file.  Social History   Tobacco Use  . Smoking status: Former Smoker    Last attempt to quit: 01/07/2016    Years since quitting: 2.1  . Smokeless tobacco: Never Used  Substance Use Topics  . Alcohol use: No  . Drug use: No    Allergies: No Known Allergies  Medications Prior to Admission  Medication Sig Dispense Refill Last Dose  . metroNIDAZOLE (FLAGYL) 500 MG tablet Take 1 tablet (500 mg total) by mouth 2 (two) times daily. 14 tablet 0   . Prenatal Vit-Fe Fumarate-FA (PRENATAL COMPLETE) 14-0.4 MG TABS Take one tab daily. 60 each 0   . Pseudoeph-Doxylamine-DM-APAP (NYQUIL D COLD/FLU PO) Take by mouth.   Taking    Review of Systems  Constitutional: Negative.   HENT: Negative.   Respiratory: Negative.   Cardiovascular: Negative.   Gastrointestinal: Positive for abdominal pain.  Genitourinary: Negative.   Musculoskeletal: Negative.   Neurological: Negative.   Hematological: Negative.   Psychiatric/Behavioral: Negative.    Physical Exam   Blood pressure 114/68,  pulse 67, temperature 98.3 F (36.8 C), temperature source Oral, resp. rate 16, weight 135 lb (61.2 kg), last menstrual period 01/29/2018, SpO2 100 %, currently breastfeeding.  Physical Exam  Constitutional: She appears well-developed.  HENT:  Head: Normocephalic.  Neck: Normal range of motion.  Respiratory: Effort normal.  GI: Soft.  Genitourinary: Vagina normal.  Skin: Skin is warm and dry.    MAU Course  Procedures  MDM -beta has now doubled to 321; patient still with occasional abdominal pain however patient thinks it is due to anxiety about fighting with FOB.    Assessment and Plan   1. Pregnant state, incidental    2. Patient to have US for viability in two weeks.  3. Strict ectopic precautions reviewed, including increased pain, bleeding or other concerning symptom.  4.  Patient verbalized understanding.   Charlesetta GaribaldiKathryn Lorraine Reyli Schroth 02/27/2018, 3:56 PM

## 2018-02-28 ENCOUNTER — Other Ambulatory Visit: Payer: Self-pay | Admitting: Student

## 2018-02-28 DIAGNOSIS — O3680X0 Pregnancy with inconclusive fetal viability, not applicable or unspecified: Secondary | ICD-10-CM

## 2018-03-22 ENCOUNTER — Encounter (HOSPITAL_COMMUNITY): Payer: Self-pay | Admitting: Student

## 2018-03-22 ENCOUNTER — Inpatient Hospital Stay (HOSPITAL_COMMUNITY)
Admission: AD | Admit: 2018-03-22 | Discharge: 2018-03-22 | Disposition: A | Discharge status: Home / Self Care | Payer: Self-pay | Source: Ambulatory Visit | Attending: Family Medicine | Admitting: Family Medicine

## 2018-03-22 ENCOUNTER — Inpatient Hospital Stay (HOSPITAL_COMMUNITY): Payer: Self-pay

## 2018-03-22 DIAGNOSIS — R1030 Lower abdominal pain, unspecified: Secondary | ICD-10-CM | POA: Insufficient documentation

## 2018-03-22 DIAGNOSIS — O23591 Infection of other part of genital tract in pregnancy, first trimester: Secondary | ICD-10-CM

## 2018-03-22 DIAGNOSIS — Z3491 Encounter for supervision of normal pregnancy, unspecified, first trimester: Secondary | ICD-10-CM

## 2018-03-22 DIAGNOSIS — O219 Vomiting of pregnancy, unspecified: Secondary | ICD-10-CM

## 2018-03-22 DIAGNOSIS — O209 Hemorrhage in early pregnancy, unspecified: Secondary | ICD-10-CM

## 2018-03-22 DIAGNOSIS — Z87891 Personal history of nicotine dependence: Secondary | ICD-10-CM | POA: Insufficient documentation

## 2018-03-22 DIAGNOSIS — N760 Acute vaginitis: Secondary | ICD-10-CM

## 2018-03-22 DIAGNOSIS — B9689 Other specified bacterial agents as the cause of diseases classified elsewhere: Secondary | ICD-10-CM | POA: Insufficient documentation

## 2018-03-22 DIAGNOSIS — M545 Low back pain: Secondary | ICD-10-CM | POA: Insufficient documentation

## 2018-03-22 DIAGNOSIS — Z3A01 Less than 8 weeks gestation of pregnancy: Secondary | ICD-10-CM | POA: Insufficient documentation

## 2018-03-22 DIAGNOSIS — O26891 Other specified pregnancy related conditions, first trimester: Secondary | ICD-10-CM | POA: Insufficient documentation

## 2018-03-22 LAB — URINALYSIS, ROUTINE W REFLEX MICROSCOPIC
BACTERIA UA: NONE SEEN
BILIRUBIN URINE: NEGATIVE
Glucose, UA: NEGATIVE mg/dL
Hgb urine dipstick: NEGATIVE
KETONES UR: NEGATIVE mg/dL
Nitrite: NEGATIVE
PROTEIN: NEGATIVE mg/dL
SPECIFIC GRAVITY, URINE: 1.021 (ref 1.005–1.030)
pH: 6 (ref 5.0–8.0)

## 2018-03-22 LAB — WET PREP, GENITAL
Sperm: NONE SEEN
Trich, Wet Prep: NONE SEEN
YEAST WET PREP: NONE SEEN

## 2018-03-22 MED ORDER — TERCONAZOLE 0.8 % VA CREA: 1.0000 | TOPICAL_CREAM | Freq: Every day | VAGINAL | 0 refills | Status: DC

## 2018-03-22 MED ORDER — METRONIDAZOLE 500 MG PO TABS: 500.0000 mg | ORAL_TABLET | Freq: Two times a day (BID) | ORAL | 0 refills | Status: DC

## 2018-03-22 MED ORDER — METOCLOPRAMIDE HCL 10 MG PO TABS
10.0000 mg | ORAL_TABLET | Freq: Four times a day (QID) | ORAL | 0 refills | Status: DC
Start: 2018-03-22 — End: 2018-07-13

## 2018-03-22 NOTE — Discharge Instructions (Signed)
[facility] Prenatal Care Providers   Center for [facility] at [facility]       Phone: [phone]  Center for [facility] at [place] Phone: [phone]  Center for [facility] at [place]  Phone: [phone]  Center for [facility] at [facility]  Phone: [phone]  Center for [facility] at [place]  Phone: [phone]  [facility]       Phone: [phone]  [facility]    Phone: [phone]   [facility] [place])    Phone: [phone]  [facility]    Phone: [phone]  [facility]    Phone: [phone]   [facility]  Phone: [phone]  [name]    Phone: [phone]  [facility]   Phone: [phone]  [facility]    Phone: [phone]      Bacterial Vaginosis Bacterial vaginosis is an infection of the vagina. It happens when too many germs (bacteria) grow in the vagina. This infection puts you at risk for infections from sex (STIs). Treating this infection can lower your risk for some STIs. You should also treat this if you are pregnant. It can cause your baby to be born early. Follow these instructions at home: Medicines  Take over-the-counter and prescription medicines only as told by your doctor.  Take or use your antibiotic medicine as told by your doctor. Do not stop taking or using it even if you start to feel better. General instructions  If you your sexual partner is a woman, tell her that you have this infection. She needs to get treatment if she has symptoms. If you have a female partner, he does not need to be treated.  During treatment: ? Avoid sex. ? Do not douche. ? Avoid alcohol as told. ? Avoid breastfeeding as told.  Drink enough fluid to keep your pee (urine) clear or pale  yellow.  Keep your vagina and butt (rectum) clean. ? Wash the area with warm water every day. ? Wipe from front to back after you use the toilet.  Keep all follow-up visits as told by your doctor. This is important. Preventing this condition  Do not douche.  Use only warm water to wash around your vagina.  Use protection when you have sex. This includes: ? Latex condoms. ? Dental dams.  Limit how many people you have sex with. It is best to only have sex with the same person (be monogamous).  Get tested for STIs. Have your partner get tested.  Wear underwear that is cotton or lined with cotton.  Avoid tight pants and pantyhose. This is most important in summer.  Do not use any products that have nicotine or tobacco in them. These include cigarettes and e-cigarettes. If you need help quitting, ask your doctor.  Do not use illegal drugs.  Limit how much alcohol you drink. Contact a doctor if:  Your symptoms do not get better, even after you are treated.  You have more discharge or pain when you pee (urinate).  You have a fever.  You have pain in your belly (abdomen).  You have pain with sex.  Your bleed from your vagina between periods. Summary  This infection happens when too many germs (bacteria) grow in the vagina.  Treating this condition can lower your risk for some infections from sex (STIs).  You should also treat this if you are pregnant. It can cause  early (premature) birth.  Do not stop taking or using your antibiotic medicine even if you start to feel better. This information is not intended to replace advice given to you by your health care provider. Make sure you discuss any questions you have with your health care provider. Document Released: 08/04/2008 Document Revised: 07/11/2016 Document Reviewed: 07/11/2016 Elsevier Interactive Patient Education  2017 Elsevier Inc.  Morning Sickness Morning sickness is when you feel sick to your stomach  (nauseous) during pregnancy. This nauseous feeling may or may not come with vomiting. It often occurs in the morning but can be a problem any time of day. Morning sickness is most common during the first trimester, but it may continue throughout pregnancy. While morning sickness is unpleasant, it is usually harmless unless you develop severe and continual vomiting (hyperemesis gravidarum). This condition requires more intense treatment. What are the causes? The cause of morning sickness is not completely known but seems to be related to normal hormonal changes that occur in pregnancy. What increases the risk? You are at greater risk if you:  Experienced nausea or vomiting before your pregnancy.  Had morning sickness during a previous pregnancy.  Are pregnant with more than one baby, such as twins.  How is this treated? Do not use any medicines (prescription, over-the-counter, or herbal) for morning sickness without first talking to your health care provider. Your health care provider may prescribe or recommend:  Vitamin B6 supplements.  Anti-nausea medicines.  The herbal medicine ginger.  Follow these instructions at home:  Only take over-the-counter or prescription medicines as directed by your health care provider.  Taking multivitamins before getting pregnant can prevent or decrease the severity of morning sickness in most women.  Eat a piece of dry toast or unsalted crackers before getting out of bed in the morning.  Eat five or six small meals a day.  Eat dry and bland foods (rice, baked potato). Foods high in carbohydrates are often helpful.  Do not drink liquids with your meals. Drink liquids between meals.  Avoid greasy, fatty, and spicy foods.  Get someone to cook for you if the smell of any food causes nausea and vomiting.  If you feel nauseous after taking prenatal vitamins, take the vitamins at night or with a snack.  Snack on protein foods (nuts, yogurt, cheese)  between meals if you are hungry.  Eat unsweetened gelatins for desserts.  Wearing an acupressure wristband (worn for sea sickness) may be helpful.  Acupuncture may be helpful.  Do not smoke.  Get a humidifier to keep the air in your house free of odors.  Get plenty of fresh air. Contact a health care provider if:  Your home remedies are not working, and you need medicine.  You feel dizzy or lightheaded.  You are losing weight. Get help right away if:  You have persistent and uncontrolled nausea and vomiting.  You pass out (faint). This information is not intended to replace advice given to you by your health care provider. Make sure you discuss any questions you have with your health care provider. Document Released: 12/17/2006 Document Revised: 04/02/2016 Document Reviewed: 04/12/2013 Elsevier Interactive Patient Education  2017 [employer].

## 2018-03-22 NOTE — MAU Provider Note (Signed)
History     CSN: [id number]  Arrival date and time: 03/22/18 4098   First Provider Initiated Contact with Patient 03/22/18 2228      Chief Complaint  Patient presents with  . Vaginal Bleeding   HPI Monica [name] is a 22 y.o. G2P1001 at [redacted]w[redacted]d by LMP who presents with vaginal bleeding. Was seen 4/22; was supposed to have f/u ultrasound but never received phone call to schedule u/s. Reports red spotting on toilet paper when she wipes since yesterday. Also has noticed thick white discharge with vaginal itching. Lower abdominal cramping and low back pain that has continued since her evaluation last month.  OB History    Gravida  2   Para  1   Term  1   Preterm      AB      Living  1     SAB      TAB      Ectopic      Multiple  0   Live Births  1           Past Medical History:  Diagnosis Date  . Medical history non-contributory     Past Surgical History:  Procedure Laterality Date  . APPENDECTOMY      No family history on file.  Social History   Tobacco Use  . Smoking status: Former Smoker    Last attempt to quit: 01/07/2016    Years since quitting: 2.2  . Smokeless tobacco: Never Used  Substance Use Topics  . Alcohol use: No  . Drug use: No    Allergies: No Known Allergies  Medications Prior to Admission  Medication Sig Dispense Refill Last Dose  . Prenatal Vit-Fe Fumarate-FA (PRENATAL COMPLETE) 14-0.4 MG TABS Take one tab daily. 60 each 0     Review of Systems  Constitutional: Negative.   Gastrointestinal: Positive for abdominal pain.  Genitourinary: Positive for vaginal discharge. Negative for dysuria.  Musculoskeletal: Positive for back pain.   Physical Exam   Blood pressure 118/63, pulse (!) 58, temperature 98.3 F (36.8 C), temperature source Oral, height  (1.6 m), weight 135 lb 12 oz (61.6 kg), last menstrual period 01/29/2018, currently breastfeeding.  Physical Exam  Nursing note and vitals  reviewed. Constitutional: She is oriented to person, place, and time. She appears well-developed and well-nourished. No distress.  HENT:  Head: Normocephalic and atraumatic.  Eyes: Conjunctivae are normal. Right eye exhibits no discharge. Left eye exhibits no discharge. No scleral icterus.  Neck: Normal range of motion.  Respiratory: Effort normal. No respiratory distress.  GI: Soft. There is no tenderness.  Genitourinary: No bleeding in the vagina. Vaginal discharge found.  Genitourinary Comments: Cervix closed  Neurological: She is alert and oriented to person, place, and time.  Skin: Skin is warm and dry. She is not diaphoretic.  Psychiatric: She has a normal mood and affect. Her behavior is normal. Judgment and thought content normal.    MAU Course  Procedures Results for orders placed or performed during the hospital encounter of 03/22/18 (from the past 24 hour(s))  Urinalysis, Routine w reflex microscopic     Status: Abnormal   Collection Time: 03/22/18  7:54 PM  Result Value Ref Range   Color, Urine YELLOW YELLOW   APPearance CLEAR CLEAR   Specific Gravity, Urine 1.021 1.005 - 1.030   pH 6.0 5.0 - 8.0   Glucose, UA NEGATIVE NEGATIVE mg/dL   Hgb urine dipstick NEGATIVE NEGATIVE   Bilirubin Urine NEGATIVE NEGATIVE  Ketones, ur NEGATIVE NEGATIVE mg/dL   Protein, ur NEGATIVE NEGATIVE mg/dL   Nitrite NEGATIVE NEGATIVE   Leukocytes, UA SMALL (A) NEGATIVE   RBC / HPF 0-5 0 - 5 RBC/hpf   WBC, UA 11-20 0 - 5 WBC/hpf   Bacteria, UA NONE SEEN NONE SEEN   Squamous Epithelial / LPF 0-5 0 - 5   Mucus PRESENT   Wet prep, genital     Status: Abnormal   Collection Time: 03/22/18 10:40 PM  Result Value Ref Range   Yeast Wet Prep HPF POC NONE SEEN NONE SEEN   Trich, Wet Prep NONE SEEN NONE SEEN   Clue Cells Wet Prep HPF POC PRESENT (A) NONE SEEN   WBC, Wet Prep HPF POC MANY (A) NONE SEEN   Sperm NONE SEEN    US Ob Transvaginal  Result Date: 03/22/2018 CLINICAL DATA:  Bleeding and  cramping EXAM: TRANSVAGINAL OB ULTRASOUND TECHNIQUE: Transvaginal ultrasound was performed for complete evaluation of the gestation as well as the maternal uterus, adnexal regions, and pelvic cul-de-sac. COMPARISON:  02/25/2017 FINDINGS: Intrauterine gestational sac: Visible Yolk sac:  Visible Embryo:  Visible Cardiac Activity: Visible Heart Rate: 144 bpm CRL:   13.1 mm   7 w 4 d                  [place] EDC: 11/04/2018 Subchorionic hemorrhage:  None visualized. Maternal uterus/adnexae: Ovaries are within normal limits. The right ovary measures 1.5 x 2.4 x 1.7 cm. The left ovary measures 2.3 by 3.2 x 2.2 cm and contains a corpus luteal cyst. No significant free fluid. IMPRESSION: Single viable intrauterine pregnancy as above. No specific abnormalities are seen. Electronically Signed   By: Jasmine Pang M.D.   On: 03/22/2018 22:05    MDM Ultrasound shows IUP with cardiac activity B positive On exam, pt appears to have yeast. No blood noted.   Assessment and Plan  A; 1. Normal IUP (intrauterine pregnancy) on prenatal ultrasound, first trimester   2. Vaginal bleeding in pregnancy, first trimester   3. Nausea and vomiting during pregnancy prior to [redacted] weeks gestation   4. BV (bacterial vaginosis)   5. Vaginitis affecting pregnancy in first trimester, antepartum    P; Discharge home Rx flagyl, reglan, & terazol GC/Ct pending Start prenatal care Discussed reasons to return to MAU   [name] 03/22/2018, 10:28 PM

## 2018-03-22 NOTE — MAU Note (Signed)
PT WAS HERE ON 4-21.   SHE WAS TOLD TO RETURN  6-7 WEEKS  FOR  U/S- BUT  SHE NEVER GOT A CALL.    SAYS STARTED VAG BLEEDING YESTERDAY-  WHEN SHE  WIPES- RED.   CRAMPING  EVEN WHEN HERE IN April.    LOWER BACK PAIN STARTED YESTERDAY .    VOMITED TODAY  X1 .

## 2018-03-23 LAB — GC/CHLAMYDIA PROBE AMP (~~LOC~~) NOT AT ARMC
Chlamydia: NEGATIVE
Neisseria Gonorrhea: NEGATIVE

## 2018-04-27 ENCOUNTER — Ambulatory Visit (INDEPENDENT_AMBULATORY_CARE_PROVIDER_SITE_OTHER): Payer: Self-pay | Admitting: Advanced Practice Midwife

## 2018-04-27 ENCOUNTER — Encounter: Payer: Self-pay | Admitting: Advanced Practice Midwife

## 2018-04-27 VITALS — BP 102/55 | HR 72 | Wt 137.0 lb

## 2018-04-27 DIAGNOSIS — O0990 Supervision of high risk pregnancy, unspecified, unspecified trimester: Secondary | ICD-10-CM | POA: Insufficient documentation

## 2018-04-27 DIAGNOSIS — Z3491 Encounter for supervision of normal pregnancy, unspecified, first trimester: Secondary | ICD-10-CM

## 2018-04-27 DIAGNOSIS — Z3480 Encounter for supervision of other normal pregnancy, unspecified trimester: Secondary | ICD-10-CM

## 2018-04-27 MED ORDER — ONDANSETRON 8 MG PO TBDP: 8.0000 mg | ORAL_TABLET | Freq: Three times a day (TID) | ORAL | 3 refills | Status: DC | PRN

## 2018-04-27 NOTE — Progress Notes (Signed)
Subjective:   Monica [name] is a 22 y.o. G2P1001 at [redacted]w[redacted]d by LMP, confirmed with 7 week [place] being seen today for her first obstetrical visit.  Her obstetrical history is significant for none. Patient does intend to breast feed. Pregnancy history fully reviewed.  Patient reports nausea. Taking reglan. She states that it helps sometimes.   HISTORY: OB History  Gravida Para Term Preterm AB Living  2 1 1  0 0 1  SAB TAB Ectopic Multiple Live Births  0 0 0 0 1    # Outcome Date GA Lbr Len/2nd Weight Sex Delivery Anes PTL Lv  2 Current           1 Term 10/22/16 [redacted]w[redacted]d 16:05 6 lb 7 oz (2.92 kg) M Vag-Spont None  LIV     Name: Monica [name]     Apgar1: 8  Apgar5: 9    Last pap smear was done approx 6 months ago at the HD and was normal per the patient.   Past Medical History:  Diagnosis Date  . Medical history non-contributory    Past Surgical History:  Procedure Laterality Date  . APPENDECTOMY     Family History  Problem Relation Age of Onset  . Diabetes Maternal Grandfather    Social History   Tobacco Use  . Smoking status: Former Smoker    Last attempt to quit: 01/07/2016    Years since quitting: 2.3  . Smokeless tobacco: Never Used  Substance Use Topics  . Alcohol use: No  . Drug use: No   No Known Allergies Current Outpatient Medications on File Prior to Visit  Medication Sig Dispense Refill  . metoCLOPramide (REGLAN) 10 MG tablet Take 1 tablet (10 mg total) by mouth every 6 (six) hours. 30 tablet 0  . Prenatal Vit-Fe Fumarate-FA (PRENATAL COMPLETE) 14-0.4 MG TABS Take one tab daily. 60 each 0   No current facility-administered medications on file prior to visit.     Review of Systems Pertinent items noted in HPI and remainder of comprehensive ROS otherwise negative.  Exam   Vitals:   04/27/18 1553  BP: (!) 102/55  Pulse: 72  Weight: 137 lb (62.1 kg)   Fetal Heart Rate (bpm): 158  Uterus:     Pelvic Exam: Perineum:    Vulva:    Vagina:     Cervix:    Adnexa:    Bony Pelvis:   System: General: well-developed, well-nourished female in no acute distress   Breast:  normal appearance, no masses or tenderness   Skin: normal coloration and turgor, no rashes   Neurologic: oriented, normal, negative, normal mood   Extremities: normal strength, tone, and muscle mass, ROM of all joints is normal   HEENT PERRLA, extraocular movement intact and sclera clear, anicteric   Mouth/Teeth mucous membranes moist, pharynx normal without lesions and dental hygiene good   Neck supple and no masses   Cardiovascular: regular rate and rhythm   Respiratory:  no respiratory distress, normal breath sounds   Abdomen: soft, non-tender; bowel sounds normal; no masses,  no organomegaly    Assessment:   Pregnancy: G2P1001 Patient Active Problem List   Diagnosis Date Noted  . Supervision of low-risk pregnancy 04/27/2018  . Social problem 11/30/2016     Plan:  1. Supervision of other normal pregnancy, antepartum  - [facility] AMB BABYSCRIPTS OPT IN - Culture, OB Urine - Cystic fibrosis gene test - Genetic Screening - Obstetric Panel, Including HIV - SMN1 COPY NUMBER ANALYSIS (SMA Carrier Screen) -  ROI for pap done at the HD   Initial labs drawn. Continue prenatal vitamins. Genetic Screening discussed, NIPS: requested. Ultrasound discussed; fetal anatomic survey: requested. Problem list reviewed and updated. The nature of [facility] with multiple MDs and other Advanced Practice Providers was explained to patient; also emphasized that residents, students are part of our team. Routine obstetric precautions reviewed. 50% of 45 min visit spent in counseling and coordination of care. Return in about 1 month (around 05/25/2018).

## 2018-04-27 NOTE — Patient Instructions (Signed)
Safe Medications in Pregnancy   Acne: Benzoyl Peroxide Salicylic Acid  Backache/Headache: Tylenol: 2 regular strength every 4 hours OR              2 Extra strength every 6 hours  Colds/Coughs/Allergies: Benadryl (alcohol free) 25 mg every 6 hours as needed Breath right strips Claritin Cepacol throat lozenges Chloraseptic throat spray Cold-Eeze- up to three times per day Cough drops, alcohol free Flonase (by prescription only) Guaifenesin Mucinex Robitussin DM (plain only, alcohol free) Saline nasal spray/drops Sudafed (pseudoephedrine) & Actifed ** use only after [redacted] weeks gestation and if you do not have high blood pressure Tylenol Vicks Vaporub Zinc lozenges Zyrtec   Constipation: Colace Ducolax suppositories Fleet enema Glycerin suppositories Metamucil Milk of magnesia Miralax Senokot Smooth move tea  Diarrhea: Kaopectate Imodium A-D  *NO pepto Bismol  Hemorrhoids: Anusol Anusol HC Preparation H Tucks  Indigestion: Tums Maalox Mylanta Zantac  Pepcid  Insomnia: Benadryl (alcohol free) 25mg every 6 hours as needed Tylenol PM Unisom, no Gelcaps  Leg Cramps: Tums MagGel  Nausea/Vomiting:  Bonine Dramamine Emetrol Ginger extract Sea bands Meclizine  Nausea medication to take during pregnancy:  Unisom (doxylamine succinate 25 mg tablets) Take one tablet daily at bedtime. If symptoms are not adequately controlled, the dose can be increased to a maximum recommended dose of two tablets daily (1/2 tablet in the morning, 1/2 tablet mid-afternoon and one at bedtime). Vitamin B6 100mg tablets. Take one tablet twice a day (up to 200 mg per day).  Skin Rashes: Aveeno products Benadryl cream or 25mg every 6 hours as needed Calamine Lotion 1% cortisone cream  Yeast infection: Gyne-lotrimin 7 Monistat 7   **If taking multiple medications, please check labels to avoid duplicating the same active ingredients **take medication as directed on  the label ** Do not exceed 4000 mg of tylenol in 24 hours **Do not take medications that contain aspirin or ibuprofen    BENEFITS OF BREASTFEEDING Many women wonder if they should breastfeed. Research shows that breast milk contains the perfect balance of vitamins, protein and fat that your baby needs to grow. It also contains antibodies that help your baby's immune system to fight off viruses and bacteria and can reduce the risk of sudden infant death syndrome (SIDS). In addition, the colostrum (a fluid secreted from the breast in the first few days after delivery) helps your newborn's digestive system to grow and function well. Breast milk is easier to digest than formula. Also, if your baby is born preterm, breast milk can help to reduce both short- and long-term health problems. BENEFITS OF BREASTFEEDING FOR MOM . Breastfeeding causes a hormone to be released that helps the uterus to contract and return to its normal size more quickly. . It aids in postpartum weight loss, reduces risk of breast and ovarian cancer, heart disease and rheumatoid arthritis. . It decreases the amount of bleeding after the baby is born. benefits of breastfeeding for baby . Provides comfort and nutrition . Protects baby against - Obesity - Diabetes - Asthma - Childhood cancers - Heart disease - Ear infections - Diarrhea - Pneumonia - Stomach problems - Serious allergies - Skin rashes . Promotes growth and development . Reduces the risk of baby having Sudden Infant Death Syndrome (SIDS) only breastmilk for the first 6 months . Protects baby against diseases/allergies . It's the perfect amount for tiny bellies . It restores baby's energy . Provides the best nutrition for baby . Giving water or formula can make baby more   likely to get sick, decrease Mom's milk supply, make baby less content with breastfeeding Skin to Skin After delivery, the staff will place your baby on your chest. This helps with the  following: . Regulates baby's temperature, breathing, heart rate and blood sugar . Increases Mom's milk supply . Promotes bonding . Keeps baby and Mom calm and decreases baby's crying Rooming In Your baby will stay in your room with you for the entire time you are in the hospital. This helps with the following: . Allows Mom to learn baby's feeding cues - Fluttering eyes - Sucking on tongue or hand - Rooting (opens mouth and turns head) - Nuzzling into the breast - Bringing hand to mouth . Allows breastfeeding on demand (when your baby is ready) . Helps baby to be calm and content . Ensures a good milk supply . Prevents complications with breastfeeding . Allows parents to learn to care for baby . Allows you to request assistance with breastfeeding Importance of a good latch . Increases milk transfer to baby - baby gets enough milk . Ensures you have enough milk for your baby . Decreases nipple soreness . Don't use pacifiers and bottles - these cause baby to suck differently than breastfeeding . Promotes continuation of breastfeeding Risks of Formula Supplementation with Breastfeeding Giving your infant formula in addition to your breast-milk EXCEPT when medically necessary can lead to: . Decreases your milk supply  . Loss of confidence in yourself for providing baby's nutrition  . Engorgement and possibly mastitis  . Asthma & allergies in the baby BREASTFEEDING FAQS How long should I breastfeed my baby? It is recommended that you provide your baby with breast milk only for the first 6 months and then continue for the first year and longer as desired. During the first few weeks after birth, your baby will need to feed 8-12 times every 24 hours, or every 2-3 hours. They will likely feed for 15-30 minutes. How can I help my baby begin breastfeeding? Babies are born with an instinct to breastfeed. A healthy baby can begin breastfeeding right away without specific help. At the hospital,  a nurse (or lactation consultant) will help you begin the process and will give you tips on good positioning. It may be helpful to take a breastfeeding class before you deliver in order to know what to expect. How can I help my baby latch on? In order to assist your baby in latching-on, cup your breast in your hand and stroke your baby's lower lip with your nipple to stimulate your baby's rooting reflex. Your baby will look like he or she is yawning, at which point you should bring the baby towards your breast, while aiming the nipple at the roof of his or her mouth. Remember to bring the baby towards you and not your breast towards the baby. How can I tell if my baby is latched-on? Your baby will have all of your nipple and part of the dark area around the nipple in his or her mouth and your baby's nose will be touching your breast. You should see or hear the baby swallowing. If the baby is not latched-on properly, start the process over. To remove the suction, insert a clean finger between your breast and the baby's mouth. Should I switch breasts during feeding? After feeding on one side, switch the baby to your other breast. If he or she does not continue feeding - that is OK. Your baby will not necessarily need to feed from   both breasts in a single feeding. On the next feeding, start with the other breast for efficiency and comfort. How can I tell if my baby is hungry? When your baby is hungry, they will nuzzle against your breast, make sucking noises and tongue motions and may put their hands near their mouth. Crying is a late sign of hunger, so you should not wait until this point. When they have received enough milk, they will unlatch from the breast. Is it okay to use a pacifier? Until your baby gets the hang of breastfeeding, experts recommend limiting pacifier usage. If you have questions about this, please contact your pediatrician. What can I do to ensure proper nutrition while  breastfeeding? . Make sure that you support your own health and your baby's by eating a healthy, well-balanced diet . Your provider may recommend that you continue to take your prenatal vitamin . Drink plenty of fluids. It is a good rule to drink one glass of water before or after feeding . Alcohol will remain in the breast milk for as long as it will remain in the blood stream. If you choose to have a drink, it is recommended that you wait at least 2 hours before feeding . Moderate amounts of caffeine are OK . Some over-the-counter or prescription medications are not recommended during breastfeeding. Check with your provider if you have questions What types of birth control methods are safe while breastfeeding? Progestin-only methods, including a daily pill, an IUD, the implant and the injection are safe while breastfeeding. Methods that contain estrogen (such as combination birth control pills, the vaginal ring and the patch) should not be used during the first month of breastfeeding as these can decrease your milk supply.  

## 2018-04-27 NOTE — Progress Notes (Signed)
Patient reports increase in headaches with dizziness for the past week

## 2018-05-01 LAB — URINE CULTURE, OB REFLEX

## 2018-05-01 LAB — CULTURE, OB URINE

## 2018-05-04 LAB — OBSTETRIC PANEL, INCLUDING HIV
Antibody Screen: NEGATIVE
BASOS ABS: 0 10*3/uL (ref 0.0–0.2)
Basos: 0 %
EOS (ABSOLUTE): 0.1 10*3/uL (ref 0.0–0.4)
Eos: 1 %
HEP B S AG: NEGATIVE
HIV Screen 4th Generation wRfx: NONREACTIVE
Hematocrit: 34.9 % (ref 34.0–46.6)
Hemoglobin: 11.2 g/dL (ref 11.1–15.9)
IMMATURE GRANS (ABS): 0 10*3/uL (ref 0.0–0.1)
IMMATURE GRANULOCYTES: 0 %
LYMPHS ABS: 1.9 10*3/uL (ref 0.7–3.1)
LYMPHS: 21 %
MCH: 27.4 pg (ref 26.6–33.0)
MCHC: 32.1 g/dL (ref 31.5–35.7)
MCV: 85 fL (ref 79–97)
Monocytes Absolute: 0.4 10*3/uL (ref 0.1–0.9)
Monocytes: 4 %
NEUTROS PCT: 74 %
Neutrophils Absolute: 6.3 10*3/uL (ref 1.4–7.0)
PLATELETS: 295 10*3/uL (ref 150–450)
RBC: 4.09 x10E6/uL (ref 3.77–5.28)
RDW: 15.1 % (ref 12.3–15.4)
RPR: NONREACTIVE
Rh Factor: POSITIVE
Rubella Antibodies, IGG: 1.81 index (ref >0.99)
WBC: 8.6 10*3/uL (ref 3.4–10.8)

## 2018-05-04 LAB — CYSTIC FIBROSIS GENE TEST

## 2018-05-04 LAB — SMN1 COPY NUMBER ANALYSIS (SMA CARRIER SCREENING)

## 2018-05-05 ENCOUNTER — Encounter (HOSPITAL_COMMUNITY): Payer: Self-pay | Admitting: *Deleted

## 2018-05-05 ENCOUNTER — Inpatient Hospital Stay (HOSPITAL_COMMUNITY)
Admission: AD | Admit: 2018-05-05 | Discharge: 2018-05-05 | Disposition: A | Discharge status: Home / Self Care | Payer: Self-pay | Source: Ambulatory Visit | Attending: Obstetrics and Gynecology | Admitting: Obstetrics and Gynecology

## 2018-05-05 DIAGNOSIS — Z3A13 13 weeks gestation of pregnancy: Secondary | ICD-10-CM | POA: Insufficient documentation

## 2018-05-05 DIAGNOSIS — B3731 Acute candidiasis of vulva and vagina: Secondary | ICD-10-CM

## 2018-05-05 DIAGNOSIS — O2342 Unspecified infection of urinary tract in pregnancy, second trimester: Secondary | ICD-10-CM

## 2018-05-05 DIAGNOSIS — Z79899 Other long term (current) drug therapy: Secondary | ICD-10-CM | POA: Insufficient documentation

## 2018-05-05 DIAGNOSIS — B373 Candidiasis of vulva and vagina: Secondary | ICD-10-CM | POA: Insufficient documentation

## 2018-05-05 DIAGNOSIS — Z3491 Encounter for supervision of normal pregnancy, unspecified, first trimester: Secondary | ICD-10-CM

## 2018-05-05 DIAGNOSIS — N930 Postcoital and contact bleeding: Secondary | ICD-10-CM | POA: Insufficient documentation

## 2018-05-05 DIAGNOSIS — Z87891 Personal history of nicotine dependence: Secondary | ICD-10-CM | POA: Insufficient documentation

## 2018-05-05 DIAGNOSIS — O98811 Other maternal infectious and parasitic diseases complicating pregnancy, first trimester: Secondary | ICD-10-CM | POA: Insufficient documentation

## 2018-05-05 DIAGNOSIS — O2341 Unspecified infection of urinary tract in pregnancy, first trimester: Secondary | ICD-10-CM | POA: Insufficient documentation

## 2018-05-05 LAB — URINALYSIS, ROUTINE W REFLEX MICROSCOPIC
BILIRUBIN URINE: NEGATIVE
GLUCOSE, UA: NEGATIVE mg/dL
Ketones, ur: NEGATIVE mg/dL
NITRITE: NEGATIVE
PH: 7 (ref 5.0–8.0)
Protein, ur: NEGATIVE mg/dL
Specific Gravity, Urine: 1.015 (ref 1.005–1.030)

## 2018-05-05 LAB — WET PREP, GENITAL
CLUE CELLS WET PREP: NONE SEEN
Trich, Wet Prep: NONE SEEN

## 2018-05-05 MED ORDER — TERCONAZOLE 0.4 % VA CREA: 1.0000 | TOPICAL_CREAM | Freq: Every day | VAGINAL | 0 refills | Status: DC

## 2018-05-05 MED ORDER — CEPHALEXIN 500 MG PO CAPS: 500.0000 mg | ORAL_CAPSULE | Freq: Four times a day (QID) | ORAL | 0 refills | Status: DC

## 2018-05-05 NOTE — MAU Note (Signed)
Pt has been having vaginal bleeding since yesterday, noticing when she urinates, not soaking a pad. No pain.

## 2018-05-05 NOTE — MAU Provider Note (Signed)
History     CSN: [id number]  Arrival date and time: 05/05/18 1306   First Provider Initiated Contact with Patient 05/05/18 1332      Chief Complaint  Patient presents with  . Vaginal Bleeding   Monica [name] is a 22 y.o. G2P1001 at [redacted]w[redacted]d by LMP consistent with ultrasound at [redacted]w[redacted]d presenting with a second episode of vaginal bleeding.  She describes onset last night of intermittent red spotting seen on toilet paper after voiding and wiping.  Last intercourse was 36 hours ago.  She denies cramping or abdominal pain.  She does report increased white vaginal discharge and vaginal irritation that feels like her vagina is swollen. She has had burning sensation on urination since last night; denies frequency, urgency or hematuria.   First episode of black vaginal spotting was 03/22/2018 at which time ultrasound showed no [facility] and GC/ chlamydia were negative.  BV at that time was diagnosed and she states treated self but not partner.   OB History  Gravida Para Term Preterm AB Living  2 1 1  0 0 1  SAB TAB Ectopic Multiple Live Births  0 0 0 0 1    # Outcome Date GA Lbr Len/2nd Weight Sex Delivery Anes PTL Lv  2 Current           1 Term 10/22/16 [redacted]w[redacted]d 16:05 6 lb 7 oz (2.92 kg) M Vag-Spont None  LIV     Past Medical History:  Diagnosis Date  . Medical history non-contributory     Past Surgical History:  Procedure Laterality Date  . APPENDECTOMY      Family History  Problem Relation Age of Onset  . Diabetes Maternal Grandfather     Social History   Tobacco Use  . Smoking status: Former Smoker    Last attempt to quit: 01/07/2016    Years since quitting: 2.3  . Smokeless tobacco: Never Used  Substance Use Topics  . Alcohol use: No  . Drug use: No    Allergies: No Known Allergies  Medications Prior to Admission  Medication Sig Dispense Refill Last Dose  . metoCLOPramide (REGLAN) 10 MG tablet Take 1 tablet (10 mg total) by mouth every 6 (six) hours. 30 tablet 0 Taking   . ondansetron (ZOFRAN ODT) 8 MG disintegrating tablet Take 1 tablet (8 mg total) by mouth every 8 (eight) hours as needed for nausea or vomiting. 20 tablet 3   . Prenatal Vit-Fe Fumarate-FA (PRENATAL COMPLETE) 14-0.4 MG TABS Take one tab daily. 60 each 0 Taking    Review of Systems  Constitutional: Negative for chills, fatigue and fever.  Gastrointestinal: Positive for nausea and vomiting. Negative for abdominal pain, anal bleeding, blood in stool, constipation and diarrhea.       N/V of pregnancy improved with meds; rarely vomits  Genitourinary: Positive for dysuria, vaginal bleeding and vaginal discharge. Negative for flank pain, frequency, hematuria, pelvic pain, urgency and vaginal pain.  Musculoskeletal: Negative for back pain.  Psychiatric/Behavioral: The patient is not nervous/anxious.    Physical Exam   Blood pressure (!) 104/52, pulse 80, temperature 98.3 F (36.8 C), temperature source Oral, resp. rate 16, weight 134 lb (60.8 kg), last menstrual period 01/29/2018, SpO2 96 %, currently breastfeeding.  Physical Exam  Nursing note and vitals reviewed. Constitutional: She is oriented to person, place, and time. She appears well-developed and well-nourished. No distress.  HENT:  Head: Normocephalic and atraumatic.  Eyes: No scleral icterus.  Neck: Neck supple. No thyromegaly present.  Cardiovascular: Normal rate.  Respiratory: Effort normal.  GI: Soft. There is no tenderness. There is no guarding.  DT 150   Genitourinary: Vaginal discharge found.  Genitourinary Comments: No CVAT Pelvic: NEFG Spec: moderate amount thin brown-tinged discharge in vault; no active bleeding noted: cx clean Bimanual: cx posterior, closed, thick; Uterus c/w 13 wks size   Musculoskeletal: Normal range of motion.  Neurological: She is alert and oriented to person, place, and time.  Skin: Skin is warm and dry.  Psychiatric: She has a normal mood and affect. Her behavior is normal. Thought content  normal.    MAU Course  Procedures Results for orders placed or performed during the hospital encounter of 05/05/18 (from the past 24 hour(s))  Urinalysis, Routine w reflex microscopic     Status: Abnormal   Collection Time: 05/05/18  1:24 PM  Result Value Ref Range   Color, Urine YELLOW YELLOW   APPearance HAZY (A) CLEAR   Specific Gravity, Urine 1.015 1.005 - 1.030   pH 7.0 5.0 - 8.0   Glucose, UA NEGATIVE NEGATIVE mg/dL   Hgb urine dipstick MODERATE (A) NEGATIVE   Bilirubin Urine NEGATIVE NEGATIVE   Ketones, ur NEGATIVE NEGATIVE mg/dL   Protein, ur NEGATIVE NEGATIVE mg/dL   Nitrite NEGATIVE NEGATIVE   Leukocytes, UA MODERATE (A) NEGATIVE   RBC / HPF 21-50 0 - 5 RBC/hpf   WBC, UA 11-20 0 - 5 WBC/hpf   Bacteria, UA MANY (A) NONE SEEN   Squamous Epithelial / LPF 0-5 0 - 5   Mucus PRESENT    Sperm, UA PRESENT   Wet prep, genital     Status: Abnormal   Collection Time: 05/05/18  2:03 PM  Result Value Ref Range   Yeast Wet Prep HPF POC PRESENT (A) NONE SEEN   Trich, Wet Prep NONE SEEN NONE SEEN   Clue Cells Wet Prep HPF POC NONE SEEN NONE SEEN   WBC, Wet Prep HPF POC MANY (A) NONE SEEN   Sperm PRESENT    Urine culture sent  MDM SInce symptomatic with dysuria will treat for presumptive UTI. Pelvic rest until OB visit.   Assessment and Plan  G2P1001 at 383w5d 1. Postcoital and contact bleeding   2. Encounter for supervision of low-risk pregnancy in first trimester   3. UTI (urinary tract infection) in pregnancy in second trimester   4. Yeast vaginitis    Allergies as of 05/05/2018   No Known Allergies     Medication List    TAKE these medications   cephALEXin 500 MG capsule Commonly known as:  KEFLEX Take 1 capsule (500 mg total) by mouth 4 (four) times daily.   metoCLOPramide 10 MG tablet Commonly known as:  REGLAN Take 1 tablet (10 mg total) by mouth every 6 (six) hours.   ondansetron 8 MG disintegrating tablet Commonly known as:  ZOFRAN ODT Take 1 tablet (8  mg total) by mouth every 8 (eight) hours as needed for nausea or vomiting.   PRENATAL COMPLETE 14-0.4 MG Tabs Take one tab daily.   terconazole 0.4 % vaginal cream Commonly known as:  TERAZOL 7 Place 1 applicator vaginally at bedtime.        . Follow-up Information    [facility] Schedule an appointment as soon as possible for a visit in 1 week(s).   Specialty:  Obstetrics and Gynecology Contact information: [address] [place] [id number] [phone]          Pranika Finks CNM 05/05/2018, 1:33 PM

## 2018-05-05 NOTE — Discharge Instructions (Signed)
Vaginal Yeast infection, Adult °Vaginal yeast infection is a condition that causes soreness, swelling, and redness (inflammation) of the vagina. It also causes vaginal discharge. This is a common condition. Some women get this infection frequently. °What are the causes? °This condition is caused by a change in the normal balance of the yeast (candida) and bacteria that live in the vagina. This change causes an overgrowth of yeast, which causes the inflammation. °What increases the risk? °This condition is more likely to develop in: °· Women who take antibiotic medicines. °· Women who have diabetes. °· Women who take birth control pills. °· Women who are pregnant. °· Women who douche often. °· Women who have a weak defense (immune) system. °· Women who have been taking steroid medicines for a long time. °· Women who frequently wear tight clothing. ° °What are the signs or symptoms? °Symptoms of this condition include: °· White, thick vaginal discharge. °· Swelling, itching, redness, and irritation of the vagina. The lips of the vagina (vulva) may be affected as well. °· Pain or a burning feeling while urinating. °· Pain during sex. ° °How is this diagnosed? °This condition is diagnosed with a medical history and physical exam. This will include a pelvic exam. Your health care provider will examine a sample of your vaginal discharge under a microscope. Your health care provider may send this sample for testing to confirm the diagnosis. °How is this treated? °This condition is treated with medicine. Medicines may be over-the-counter or prescription. You may be told to use one or more of the following: °· Medicine that is taken orally. °· Medicine that is applied as a cream. °· Medicine that is inserted directly into the vagina (suppository). ° °Follow these instructions at home: °· Take or apply over-the-counter and prescription medicines only as told by your health care provider. °· Do not have sex until your health  care provider has approved. Tell your sex partner that you have a yeast infection. That person should go to his or her health care provider if he or she develops symptoms. °· Do not wear tight clothes, such as pantyhose or tight pants. °· Avoid using tampons until your health care provider approves. °· Eat more yogurt. This may help to keep your yeast infection from returning. °· Try taking a sitz bath to help with discomfort. This is a warm water bath that is taken while you are sitting down. The water should only come up to your hips and should cover your buttocks. Do this 3-4 times per day or as told by your health care provider. °· Do not douche. °· Wear breathable, cotton underwear. °· If you have diabetes, keep your blood sugar levels under control. °Contact a health care provider if: °· You have a fever. °· Your symptoms go away and then return. °· Your symptoms do not get better with treatment. °· Your symptoms get worse. °· You have new symptoms. °· You develop blisters in or around your vagina. °· You have blood coming from your vagina and it is not your menstrual period. °· You develop pain in your abdomen. °This information is not intended to replace advice given to you by your health care provider. Make sure you discuss any questions you have with your health care provider. °Document Released: 08/05/2005 Document Revised: 04/08/2016 Document Reviewed: 04/29/2015 °Elsevier Interactive Patient Education © 2018 [employer] °Pregnancy and Urinary Tract Infection °What is a urinary tract infection? °A urinary tract infection (UTI) is an infection of any   part of the urinary tract. This includes the kidneys, the tubes that connect your kidneys to your bladder (ureters), the bladder, and the tube that carries urine out of your body (urethra). These organs make, store, and get rid of urine in the body. A UTI can be a bladder infection (cystitis) or a kidney infection (pyelonephritis). This infection may be  caused by fungi, viruses, and bacteria. Bacteria are the most common cause of UTIs. °You are more likely to develop a UTI during pregnancy because: °· The physical and hormonal changes your body goes through can make it easier for bacteria to get into your urinary tract. °· Your growing baby puts pressure on your uterus and can affect urine flow. ° °Does a UTI place my baby at risk? °An untreated UTI during pregnancy could lead to a kidney infection, which can cause health problems that could affect your baby. Possible complications of an untreated UTI include: °· Having your baby before 37 weeks of pregnancy (premature). °· Having a baby with a low birth weight. °· Developing high blood pressure during pregnancy (preeclampsia). ° °What are the symptoms of a UTI? °Symptoms of a UTI include: °· Fever. °· Frequent urination or passing small amounts of urine frequently. °· Needing to urinate urgently. °· Pain or a burning sensation with urination. °· Urine that smells bad or unusual. °· Cloudy urine. °· Pain in the lower abdomen or back. °· Trouble urinating. °· Blood in the urine. °· Vomiting or being less hungry than normal. °· Diarrhea or abdominal pain. °· Vaginal discharge. ° °What are the treatment options for a UTI during pregnancy? °Treatment for this condition may include: °· Antibiotic medicines that are safe to take during pregnancy. °· Other medicines to treat less common causes of UTI. ° °How can I prevent a UTI? ° °To prevent a UTI: °· Go to the bathroom as soon as you feel the need. °· Always wipe from front to back. °· Wash your genital area with soap and warm water daily. °· Empty your bladder before and after sex. °· Wear cotton underwear. °· Limit your intake of high sugar foods or drinks, such as regular soda, juice, and sweets. °· Drink 6-8 glasses of water daily. °· Do not wear tight-fitting pants. °· Do not douche or use deodorant sprays. °· Do not drink alcohol, caffeine, or carbonated drinks.  These can irritate the bladder. ° °Contact a health care provider if: °· Your symptoms do not improve or get worse. °· You have a fever after two days of treatment. °· You have a rash. °· You have abnormal vaginal discharge. °· You have back or side pain. °· You have chills. °· You have nausea and vomiting. °Get help right away if: °Seek immediate medical care if you are pregnant and: °· You feel contractions in your uterus. °· You have lower belly pain. °· You have a gush of fluid from your vagina. °· You have blood in your urine. °· You are vomiting and cannot keep down any medicines or water. ° °This information is not intended to replace advice given to you by your health care provider. Make sure you discuss any questions you have with your health care provider. °Document Released: 02/20/2011 Document Revised: 10/09/2016 Document Reviewed: 09/16/2015 °Elsevier Interactive Patient Education © 2017 [employer] ° °

## 2018-05-06 LAB — CULTURE, OB URINE

## 2018-05-06 LAB — GC/CHLAMYDIA PROBE AMP (~~LOC~~) NOT AT ARMC
Chlamydia: NEGATIVE
Neisseria Gonorrhea: NEGATIVE

## 2018-05-18 ENCOUNTER — Encounter: Payer: Self-pay | Admitting: *Deleted

## 2018-05-24 ENCOUNTER — Encounter: Payer: Self-pay | Admitting: Advanced Practice Midwife

## 2018-05-24 ENCOUNTER — Ambulatory Visit (INDEPENDENT_AMBULATORY_CARE_PROVIDER_SITE_OTHER): Payer: Self-pay | Admitting: Advanced Practice Midwife

## 2018-05-24 VITALS — BP 102/55 | HR 93 | Wt 135.0 lb

## 2018-05-24 DIAGNOSIS — Z3491 Encounter for supervision of normal pregnancy, unspecified, first trimester: Secondary | ICD-10-CM

## 2018-05-24 NOTE — Progress Notes (Signed)
   PRENATAL VISIT NOTE  Subjective:  Monica [name] is a 22 y.o. G2P1001 at 4653w3d being seen today for ongoing prenatal care.  She is currently monitored for the following issues for this low-risk pregnancy and has Social problem and Supervision of low-risk pregnancy on their problem list.  Patient reports no complaints.  Contractions: Not present. Vag. Bleeding: None.  Movement: Present. Denies leaking of fluid.   The following portions of the patient's history were reviewed and updated as appropriate: allergies, current medications, past family history, past medical history, past social history, past surgical history and problem list. Problem list updated.  Objective:   Vitals:   05/24/18 1400  BP: (!) 102/55  Pulse: 93  Weight: 135 lb (61.2 kg)    Fetal Status: Fetal Heart Rate (bpm): 147   Movement: Present     General:  Alert, oriented and cooperative. Patient is in no acute distress.  Skin: Skin is warm and dry. No rash noted.   Cardiovascular: Normal heart rate noted  Respiratory: Normal respiratory effort, no problems with respiration noted  Abdomen: Soft, gravid, appropriate for gestational age.  Pain/Pressure: Present     Pelvic: Cervical exam deferred        Extremities: Normal range of motion.  Edema: None  Mental Status: Normal mood and affect. Normal behavior. Normal judgment and thought content.   Assessment and Plan:  Pregnancy: G2P1001 at 5953w3d  1. Encounter for supervision of low-risk pregnancy in first trimester - US MFM OB COMP + 14 WK; Future - AFP, Serum, Open Spina Bifida  Preterm labor symptoms and general obstetric precautions including but not limited to vaginal bleeding, contractions, leaking of fluid and fetal movement were reviewed in detail with the patient. Please refer to After Visit Summary for other counseling recommendations.  Return in about 1 month (around 06/21/2018).  Future Appointments  Date Time Provider Department Center    06/07/2018 12:45 PM [facility] US 2 [facility]    Thressa ShellerHeather Persephanie Laatsch, CNM

## 2018-05-24 NOTE — Patient Instructions (Signed)

## 2018-05-26 LAB — AFP, SERUM, OPEN SPINA BIFIDA
AFP MOM: 6.72
AFP VALUE AFPOSL: 240.7 ng/mL
Gest. Age on Collection Date: 16.3 weeks
Maternal Age At EDD: 22.3 yr
OSBR RISK 1 IN: 10
Test Results:: POSITIVE — AB
Weight: 137 [lb_av]

## 2018-05-30 ENCOUNTER — Telehealth: Payer: Self-pay | Admitting: General Practice

## 2018-05-30 NOTE — Telephone Encounter (Signed)
Patient called and left message on nurse voicemail line requesting a call back for results. Called patient & informed her AFP was positive. Discussed the test is merely a screening test & isn't diagnostic. Discussed at her ultrasound next week they will examine baby closely to make sure everything is okay. Discussed in most instances, the test is a false positive. Patient verbalized understanding & had no questions.

## 2018-05-31 ENCOUNTER — Encounter (HOSPITAL_COMMUNITY): Payer: Self-pay

## 2018-06-07 ENCOUNTER — Ambulatory Visit (HOSPITAL_COMMUNITY)
Admission: RE | Admit: 2018-06-07 | Discharge: 2018-06-07 | Disposition: A | Discharge status: Home / Self Care | Payer: Self-pay | Source: Ambulatory Visit | Attending: Advanced Practice Midwife | Admitting: Advanced Practice Midwife

## 2018-06-07 ENCOUNTER — Other Ambulatory Visit: Payer: Self-pay | Admitting: Advanced Practice Midwife

## 2018-06-07 DIAGNOSIS — O358XX0 Maternal care for other (suspected) fetal abnormality and damage, not applicable or unspecified: Secondary | ICD-10-CM | POA: Insufficient documentation

## 2018-06-07 DIAGNOSIS — Z3A18 18 weeks gestation of pregnancy: Secondary | ICD-10-CM | POA: Insufficient documentation

## 2018-06-07 DIAGNOSIS — O35DXX0 Maternal care for other (suspected) fetal abnormality and damage, fetal gastrointestinal anomalies, not applicable or unspecified: Secondary | ICD-10-CM

## 2018-06-07 DIAGNOSIS — Z3491 Encounter for supervision of normal pregnancy, unspecified, first trimester: Secondary | ICD-10-CM

## 2018-06-07 DIAGNOSIS — Z3689 Encounter for other specified antenatal screening: Secondary | ICD-10-CM

## 2018-06-07 DIAGNOSIS — O281 Abnormal biochemical finding on antenatal screening of mother: Secondary | ICD-10-CM | POA: Insufficient documentation

## 2018-06-08 ENCOUNTER — Other Ambulatory Visit (HOSPITAL_COMMUNITY): Payer: Self-pay | Admitting: *Deleted

## 2018-06-08 ENCOUNTER — Telehealth: Payer: Self-pay | Admitting: Obstetrics & Gynecology

## 2018-06-08 DIAGNOSIS — O358XX0 Maternal care for other (suspected) fetal abnormality and damage, not applicable or unspecified: Secondary | ICD-10-CM

## 2018-06-08 DIAGNOSIS — O35DXX0 Maternal care for other (suspected) fetal abnormality and damage, fetal gastrointestinal anomalies, not applicable or unspecified: Secondary | ICD-10-CM

## 2018-06-08 NOTE — Telephone Encounter (Signed)
Called patient to let her know we scheduled her appointment for 06/14/2018 @ 9am. Patient stated that was okay.

## 2018-06-09 ENCOUNTER — Encounter: Payer: Self-pay | Admitting: Advanced Practice Midwife

## 2018-06-09 DIAGNOSIS — O358XX0 Maternal care for other (suspected) fetal abnormality and damage, not applicable or unspecified: Secondary | ICD-10-CM | POA: Insufficient documentation

## 2018-06-09 DIAGNOSIS — O09299 Supervision of pregnancy with other poor reproductive or obstetric history, unspecified trimester: Secondary | ICD-10-CM | POA: Insufficient documentation

## 2018-06-13 ENCOUNTER — Ambulatory Visit (HOSPITAL_COMMUNITY): Payer: Self-pay

## 2018-06-13 ENCOUNTER — Ambulatory Visit (HOSPITAL_COMMUNITY)
Admission: RE | Admit: 2018-06-13 | Discharge: 2018-06-13 | Disposition: A | Discharge status: Home / Self Care | Payer: Self-pay | Source: Ambulatory Visit | Attending: Advanced Practice Midwife | Admitting: Advanced Practice Midwife

## 2018-06-14 ENCOUNTER — Ambulatory Visit (INDEPENDENT_AMBULATORY_CARE_PROVIDER_SITE_OTHER): Payer: Self-pay | Admitting: Obstetrics & Gynecology

## 2018-06-14 ENCOUNTER — Ambulatory Visit (INDEPENDENT_AMBULATORY_CARE_PROVIDER_SITE_OTHER): Payer: Self-pay | Admitting: Clinical

## 2018-06-14 VITALS — BP 108/56 | HR 74 | Wt 135.5 lb

## 2018-06-14 DIAGNOSIS — O358XX0 Maternal care for other (suspected) fetal abnormality and damage, not applicable or unspecified: Secondary | ICD-10-CM

## 2018-06-14 DIAGNOSIS — F430 Acute stress reaction: Secondary | ICD-10-CM

## 2018-06-14 DIAGNOSIS — O35DXX0 Maternal care for other (suspected) fetal abnormality and damage, fetal gastrointestinal anomalies, not applicable or unspecified: Secondary | ICD-10-CM

## 2018-06-14 DIAGNOSIS — O0992 Supervision of high risk pregnancy, unspecified, second trimester: Secondary | ICD-10-CM

## 2018-06-14 NOTE — Patient Instructions (Signed)
Return to clinic for any scheduled appointments or obstetric concerns, or go to [facility] for evaluation  

## 2018-06-14 NOTE — BH Specialist Note (Signed)
Integrated Behavioral Health Initial Visit  MRN: [id number] Name: Monica [name]  Number of Integrated Behavioral Health Clinician visits:: 1/6 Session Start time: 10:16 Session End time: 10:35 Total time: 20 minutes  Type of Service: Integrated Behavioral Health- Individual/Family Interpretor:No. Interpretor Name and Language: n/a   Warm Hand Off Completed.       SUBJECTIVE: Monica [name] is a 22 y.o. female accompanied by n/a Patient was referred by Jaynie CollinsUgonna Anyanwu, MD for stress reaction. Patient reports the following symptoms/concerns: Pt states her primary concern today is stress over finding out diagnosis of fetal gastroschisis, with fear of the unknown, along with reactions of friends and family; pt says it helps to talk about how she is feeling today.  Duration of problem: Today; Severity of problem: moderate  OBJECTIVE: Mood: Anxious and Affect: Tearful Risk of harm to self or others: No plan to harm self or others  LIFE CONTEXT: Family and Social: Pt lives with her 3yo son; mother, grandmother, cousin, and FOB supportive School/Work: - Self-Care: - Life Changes: Current pregnancy; fetal gastroschisis diagnosis in pregnancy  GOALS ADDRESSED: Patient will: 1. Reduce symptoms of: anxiety and stress 2. Increase knowledge and/or ability of: stress reduction  3. Demonstrate ability to: Increase healthy adjustment to current life circumstances  INTERVENTIONS: Interventions utilized: Solution-Focused Strategies  Standardized Assessments completed: GAD-7 and PHQ 9  ASSESSMENT: Patient currently experiencing Acute stress reaction.   Patient may benefit from psychoeducation and brief therapeutic interventions regarding coping with symptoms of stress .  PLAN: 1. Follow up with behavioral health clinician on : As needed 2. Behavioral recommendations:  -Focus today on eating and sleeping well for self-care (Use sleep app tonight) -Have a conversation as  soon as possible with cousin before the end of the week, about diagnosis, for family emotional support.  -Discuss with cousin the two possible scenarios: Tell mom and grandma about baby's diagnosis with cousin, OR ask mom and grandma to come to next ultrasound appointment in one month 3. Referral(s): Integrated Behavioral Health Services (In Clinic) 4. "From scale of 1-10, how likely are you to follow plan?": 9  Rae LipsJamie C McMannes, LCSW   [facility] screen [facility] 06/14/2018 04/27/2018  Decreased Interest 2 1  Down, Depressed, Hopeless 0 1  PHQ - 2 Score 2 2  Altered sleeping 2 1  Tired, decreased energy 2 1  Change in appetite 0 1  Feeling bad or failure about yourself  0 0  Trouble concentrating 0 0  Moving slowly or fidgety/restless 0 0  Suicidal thoughts 0 0  PHQ-9 Score 6 5  Some encounter information is confidential and restricted. Go to Review Flowsheets activity to see all data.   GAD 7 : Generalized Anxiety Score 06/14/2018 04/27/2018  Nervous, Anxious, on Edge 2 0  Control/stop worrying 0 0  Worry too much - different things 2 0  Trouble relaxing 0 0  Restless 0 0  Easily annoyed or irritable 0 1  Afraid - awful might happen 0 0  Total GAD 7 Score 4 1  Some encounter information is confidential and restricted. Go to Review Flowsheets activity to see all data.

## 2018-06-14 NOTE — Progress Notes (Signed)
PRENATAL VISIT NOTE  Subjective:  Monica [name] is a 22 y.o. G2P1001 at 3421w3d being seen today for ongoing prenatal care.  She is currently monitored for the following issues for this high-risk pregnancy and has Social problem; Supervision of high-risk pregnancy; and Fetal gastroschisis during pregnancy, antepartum on their problem list.  Patient reports no complaints , emotional about recent diagnosis of fetal gastroschisis. Contractions: Not present. Vag. Bleeding: None.  Movement: Present. Denies leaking of fluid.   The following portions of the patient's history were reviewed and updated as appropriate: allergies, current medications, past family history, past medical history, past social history, past surgical history and problem list. Problem list updated.  Objective:   Vitals:   06/14/18 0931  BP: (!) 108/56  Pulse: 74  Weight: 135 lb 8 oz (61.5 kg)    Fetal Status: Fetal Heart Rate (bpm): 145   Movement: Present     General:  Alert, oriented and cooperative. Patient is in no acute distress.  Skin: Skin is warm and dry. No rash noted.   Cardiovascular: Normal heart rate noted  Respiratory: Normal respiratory effort, no problems with respiration noted  Abdomen: Soft, gravid, appropriate for gestational age.  Pain/Pressure: Absent     Pelvic: Cervical exam deferred        Extremities: Normal range of motion.  Edema: None  Mental Status: Normal mood and affect. Normal behavior. Normal judgment and thought content.   [place] Mfm Ob Detail +14 Wk  Result Date: 06/07/2018 ----------------------------------------------------------------------  OBSTETRICS REPORT                      (Signed Final 06/07/2018 02:13 pm) ---------------------------------------------------------------------- Patient Info  ID #:       [id number]                          D.O.B.:  16-Jun-1996 (21 yrs)  Name:       Monica                           Visit Date: 06/07/2018 01:11 pm              [name]  ---------------------------------------------------------------------- Performed By  Performed By:     Marcellina MillinKelly L [name]          Ref. Address:     [address]                    [place]                                                             [address] ,                                                             [place] [id number]  Attending:        Noralee Spaceavi Shankar MD        [facility]  Referred By:      Juleen ChinaHEATHER D  [name] CNM ---------------------------------------------------------------------- Orders   #  Description                                 Code   1  [place] MFM OB DETAIL +14 WK                     [id number]  ----------------------------------------------------------------------   #  Ordered By               Order #        Accession #    Episode #   1  Thressa Sheller            [id number]      [id number]     [id number]  ---------------------------------------------------------------------- Indications   [redacted] weeks gestation of pregnancy                Z3A.18   Encounter for antenatal screening for          Z36.3   malformations   Antenatal screening for elevated               Z36.1   alphafetoprotein level (AFP)   Gastroschisis                                  O35.8XX0  ---------------------------------------------------------------------- OB History  Gravidity:    2         Term:   1        Prem:   0        SAB:   0  TOP:          0       Ectopic:  0        Living: 1 ---------------------------------------------------------------------- Fetal Evaluation  Num Of Fetuses:     1  Fetal Heart         158  Rate(bpm):  Cardiac Activity:   Observed  Presentation:       Transverse, head to maternal right  Placenta:           Posterior  P. Cord Insertion:  Visualized  Amniotic Fluid  AFI FV:      Subjectively within normal limits ---------------------------------------------------------------------- Biometry  BPD:      39.3  mm     G. Age:  18w 0d         29  %    CI:        69.96   %    70  - 86                                                          FL/HC:      18.2   %    15.8 - 18  HC:      149.9  mm     G. Age:  18w 1d         25  %    HC/AC:      1.16        1.07 - 1.29  AC:      129.4  mm     G. Age:  18w 4d  49  %    FL/BPD:     69.5   %  FL:       27.3  mm     G. Age:  18w 2d         41  %    FL/AC:      21.1   %    20 - 24  HUM:      25.9  mm     G. Age:  18w 1d         46  %  CER:      19.4  mm     G. Age:  18w 5d         60  %  Est. FW:     237  gm      0 lb 8 oz     46  % ---------------------------------------------------------------------- Gestational Age  LMP:           18w 3d        Date:  01/29/18                 EDD:   11/05/18  U/S Today:     18w 2d                                        EDD:   11/06/18  Best:          18w 3d     Det. By:  LMP  (01/29/18)          EDD:   11/05/18 ---------------------------------------------------------------------- Anatomy  Cranium:               Appears normal         Aortic Arch:            Appears normal  Cavum:                 Appears normal         Ductal Arch:            Appears normal  Ventricles:            Appears normal         Diaphragm:              Appears normal  Choroid Plexus:        Appears normal         Stomach:                Appears normal, left                                                                        sided  Cerebellum:            Appears normal         Abdomen:                Appears normal  Posterior Fossa:       Appears normal         Abdominal Wall:         Gastroschisis  Nuchal Fold:  Appears normal         Cord Vessels:           Appears normal (3                                                                        vessel cord)  Face:                  Appears normal         Kidneys:                Appear normal                         (orbits and profile)  Lips:                  Appears normal         Bladder:                Appears normal  Thoracic:              Appears normal          Spine:                  Appears normal  Heart:                 Appears normal         Upper Extremities:      Appears normal                         (4CH, axis, and situs  RVOT:                  Appears normal         Lower Extremities:      Appears normal  LVOT:                  Appears normal  Other:  Heels and 5th digit visualized. Nasal bone visualized. ---------------------------------------------------------------------- Cervix Uterus Adnexa  Cervix  Length:            3.9  cm.  Normal appearance by transabdominal scan. ---------------------------------------------------------------------- Impression  Patient is here for fetal anatomy scan.  MSAFP screening  showed increased risk for open neural tube defects (AFP  6.72 MOM).  On cell free fetal DNA screening the risks of  aneuploidies were not increased.  Obstetric history significant for a term vaginal delivery.  Patient reports no chronic medical conditions.  She denies  tobacco drug or alcohol use.  We performed fetal anatomy scan.  Fetal gastroschisis was  seen.  Only bowel extrusion was seen extruding out.  Umbilical cord insertion site was normal.  Fetal spine looks  normal. No other markers of aneuploidies of fetal structural  defects are seen.  Fetal biometry is consistent with her  previously established gestational age.  Amniotic fluid is  normal and good fetal activity seen.  I counseled the patient on the finding of gastroschisis:  -Isolated gastroschisis in the absence of other abnormalities  is only rarely associated with chromosomal anomalies.  I  discussed the significance and limitations of cell free  fetal  DNA screening and informed her that only amniocentesis will  give a definitive result on the fetal karyotype.  -Gastroschisis can be associated with fetal growth restriction  in some cases and we recommend serial fetal growth  assessments.  -Bowel obstruction can occur in some cases of gastroschisis  in the newborn may require surgery.  -I  recommended genetic counseling and will be making an  appointment for her to return for genetic counseling.  -I briefly discussed neonatal management.  We will set up an  appointment for the patient to meet with our pediatric surgeon  in the third trimester.  -I reassured her of normal fetal spine anatomy. ---------------------------------------------------------------------- Recommendations  -Genetic counseliing.  -An appointment was made for her to return in weeks for fetal  growth assessment. ----------------------------------------------------------------------                  Monica Space, MD Electronically Signed Final Report   06/07/2018 02:13 pm ----------------------------------------------------------------------   Assessment and Plan:  Pregnancy: G2P1001 at [redacted]w[redacted]d  1. Fetal gastroschisis during pregnancy, antepartum, single or unspecified fetus Ultrasound findings reviewed again, questions answered. Need for close MFM surveillance emphasized. Patient is very emotional, has not let anyone else know. Recommended talking to counselor.  Patient verbally consented to meet with [facility] Clinician about presenting concerns. Will follow up recommendations.  2. Supervision of high risk pregnancy in second trimester No other complaints or concerns.  Routine obstetric precautions reviewed. Please refer to After Visit Summary for other counseling recommendations.  Return in about 1 month (around 07/12/2018) for OB Visit (HOB).  Future Appointments  Date Time Provider Department Center  07/05/2018 12:45 PM [facility] [place] 2 [facility]    Jaynie Collins, MD

## 2018-07-01 ENCOUNTER — Other Ambulatory Visit (HOSPITAL_COMMUNITY): Payer: Self-pay | Admitting: *Deleted

## 2018-07-05 ENCOUNTER — Ambulatory Visit (HOSPITAL_COMMUNITY)
Admission: RE | Admit: 2018-07-05 | Discharge: 2018-07-05 | Disposition: A | Discharge status: Home / Self Care | Payer: Self-pay | Source: Ambulatory Visit | Attending: Advanced Practice Midwife | Admitting: Advanced Practice Midwife

## 2018-07-05 ENCOUNTER — Encounter (HOSPITAL_COMMUNITY): Payer: Self-pay

## 2018-07-05 DIAGNOSIS — Z3A22 22 weeks gestation of pregnancy: Secondary | ICD-10-CM | POA: Insufficient documentation

## 2018-07-05 DIAGNOSIS — Z362 Encounter for other antenatal screening follow-up: Secondary | ICD-10-CM | POA: Insufficient documentation

## 2018-07-05 DIAGNOSIS — Z361 Encounter for antenatal screening for raised alphafetoprotein level: Secondary | ICD-10-CM

## 2018-07-05 DIAGNOSIS — O35DXX0 Maternal care for other (suspected) fetal abnormality and damage, fetal gastrointestinal anomalies, not applicable or unspecified: Secondary | ICD-10-CM

## 2018-07-05 DIAGNOSIS — O358XX0 Maternal care for other (suspected) fetal abnormality and damage, not applicable or unspecified: Secondary | ICD-10-CM | POA: Insufficient documentation

## 2018-07-13 ENCOUNTER — Ambulatory Visit (INDEPENDENT_AMBULATORY_CARE_PROVIDER_SITE_OTHER): Payer: Self-pay | Admitting: Obstetrics & Gynecology

## 2018-07-13 VITALS — BP 119/57 | HR 71 | Wt 138.0 lb

## 2018-07-13 DIAGNOSIS — O35DXX0 Maternal care for other (suspected) fetal abnormality and damage, fetal gastrointestinal anomalies, not applicable or unspecified: Secondary | ICD-10-CM

## 2018-07-13 DIAGNOSIS — O358XX0 Maternal care for other (suspected) fetal abnormality and damage, not applicable or unspecified: Secondary | ICD-10-CM

## 2018-07-13 DIAGNOSIS — O0992 Supervision of high risk pregnancy, unspecified, second trimester: Secondary | ICD-10-CM

## 2018-07-13 NOTE — Progress Notes (Signed)
PRENATAL VISIT NOTE  Subjective:  Monica [name] [name] is a 22 y.o. G2P1001 at [redacted]w[redacted]d being seen today for ongoing prenatal care.  She is currently monitored for the following issues for this high-risk pregnancy and has Social problem; Supervision of high-risk pregnancy; and Fetal gastroschisis during pregnancy, antepartum on their problem list.  Patient reports no complaints.  Contractions: Not present. Vag. Bleeding: None.  Movement: Present. Denies leaking of fluid.   The following portions of the patient's history were reviewed and updated as appropriate: allergies, current medications, past family history, past medical history, past social history, past surgical history and problem list. Problem list updated.  Objective:   Vitals:   07/13/18 1139  BP: (!) 119/57  Pulse: 71  Weight: 138 lb (62.6 kg)    Fetal Status: Fetal Heart Rate (bpm): 150   Movement: Present     General:  Alert, oriented and cooperative. Patient is in no acute distress.  Skin: Skin is warm and dry. No rash noted.   Cardiovascular: Normal heart rate noted  Respiratory: Normal respiratory effort, no problems with respiration noted  Abdomen: Soft, gravid, appropriate for gestational age.  Pain/Pressure: Absent     Pelvic: Cervical exam deferred        Extremities: Normal range of motion.  Edema: None  Mental Status: Normal mood and affect. Normal behavior. Normal judgment and thought content.   [place] Mfm Ob Follow Up  Result Date: 07/05/2018 ----------------------------------------------------------------------  OBSTETRICS REPORT                       (Signed Final 07/05/2018 01:52 pm) ---------------------------------------------------------------------- Patient Info  ID #:       [id number]                          D.O.B.:  05-15-1996 (21 yrs)  Name:       Monica [name]                           Visit Date: 07/05/2018 12:46 pm              [name]  ---------------------------------------------------------------------- Performed By  Performed By:     Hurman Horn          Ref. Address:     [address]                                                             [place],                                                             [place] [address]  Attending:        Noralee Space MD        Location:         [facility]  Referred By:      Juleen China  HOGAN CNM ---------------------------------------------------------------------- Orders   #  Description                          Code         Ordered By   1  [place] MFM OB FOLLOW UP                  [id number]     Noralee Space  ----------------------------------------------------------------------   #  Order #                    Accession #                 Episode #   1  [id number]                  [id number]                  [id number]  ---------------------------------------------------------------------- Indications   Antenatal screening for elevated               Z36.1   alphafetoprotein level (AFP)   Gastroschisis                                  O35.8XX0   Encounter for other antenatal screening        Z36.2   follow-up   [redacted] weeks gestation of pregnancy                Z3A.22  ---------------------------------------------------------------------- Fetal Evaluation  Num Of Fetuses:         1  Fetal Heart Rate(bpm):  157  Cardiac Activity:       Observed  Presentation:           Cephalic  Placenta:               Posterior  P. Cord Insertion:      Visualized  Amniotic Fluid  AFI FV:      Within normal limits                              Largest Pocket(cm)                              2.78 ---------------------------------------------------------------------- Biometry  BPD:      53.9  mm     G. Age:  22w 3d         45  %    CI:        81.15   %    70 - 86                                                          FL/HC:      18.8   %    18.4 - 20.2  HC:      188.9  mm     G.  Age:  21w 1d          5  %    HC/AC:      1.14        1.06 - 1.25  AC:      166.3  mm     G. Age:  21w 4d         20  %    FL/BPD:     66.0   %    71 - 87  FL:       35.6  mm     G. Age:  21w 2d         10  %    FL/AC:      21.4   %    20 - 24  HUM:      34.1  mm     G. Age:  21w 4d         28  %  LV:        5.2  mm  Est. FW:     428  gm    0 lb 15 oz      24  % ---------------------------------------------------------------------- OB History  Gravidity:    2         Term:   1        Prem:   0        SAB:   0  TOP:          0       Ectopic:  0        Living: 1 ---------------------------------------------------------------------- Gestational Age  LMP:           22w 3d        Date:  01/29/18                 EDD:   11/05/18  U/S Today:     21w 4d                                        EDD:   11/11/18  Best:          22w 3d     Det. By:  LMP  (01/29/18)          EDD:   11/05/18 ---------------------------------------------------------------------- Anatomy  Cranium:               Appears normal         Aortic Arch:            Appears normal  Cavum:                 Previously seen        Ductal Arch:            Appears normal  Ventricles:            Previously seen        Diaphragm:              Appears normal  Choroid Plexus:        Previously seen        Stomach:                Appears normal, left  sided  Cerebellum:            Previously seen        Abdomen:                Appears normal  Posterior Fossa:       Previously seen        Abdominal Wall:         Gastroschisis  Nuchal Fold:           Previously seen        Cord Vessels:           Appears normal (3                                                                        vessel cord)  Face:                  Orbits and profile     Kidneys:                Appear normal                         previously seen  Lips:                  Previously seen        Bladder:                Appears normal   Thoracic:              Appears normal         Spine:                  Previously seen  Heart:                 Appears normal         Upper Extremities:      Previously seen                         (4CH, axis, and situs  RVOT:                  Appears normal         Lower Extremities:      Previously seen  LVOT:                  Appears normal  Other:  Fetus appears to be a female. Heels and 5th digit previously seen.          Nasal bone previously seen. ---------------------------------------------------------------------- Cervix Uterus Adnexa  Cervix  Length:           3.51  cm.  Normal appearance by transabdominal scan.  Uterus  No abnormality visualized.  Left Ovary  Not visualized.  Right Ovary  Not visualized.  Cul De Sac  No free fluid seen.  Adnexa  No abnormality visualized. ---------------------------------------------------------------------- Impression  Patient with fetal gastroschisis returned for fetal growth  assessment.  She had genetic counseling on 3 weeks ago (letter scanned  into EMR). Patient opted not to have amniocentesis.  Amniotic fluid is normal and good fetal activity is seen. Fetal  growth is appropriate for gestational age. Gastroschisis is  seen again. The stomach and liver are intraabdominal.  Patient had questions about delivery and postnatal course of  the newborn. I reassured her that vaginal delivery is not  contraindicated. Briefly discussed about silos.  We will set up an appointment with pediatric surgeon in the  third trimester. ---------------------------------------------------------------------- Recommendations  An appointment was made for her to return in 4 weeks for  fetal growth assessment. ----------------------------------------------------------------------                  Noralee Space, MD Electronically Signed Final Report   07/05/2018 01:52 pm ----------------------------------------------------------------------   Assessment and Plan:  Pregnancy: G2P1001 at  [redacted]w[redacted]d  1. Fetal gastroschisis during pregnancy, antepartum, single or unspecified fetus Follow up MFM scans and recommendations.  2. Supervision of high risk pregnancy in second trimester Preterm labor symptoms and general obstetric precautions including but not limited to vaginal bleeding, contractions, leaking of fluid and fetal movement were reviewed in detail with the patient. Please refer to After Visit Summary for other counseling recommendations.  Return in about 4 weeks (around 08/10/2018) for 2 hr GTT, 3rd trimester labs, TDap, OB Visit (HOB).  No future appointments.  Jaynie Collins, MD

## 2018-07-13 NOTE — Patient Instructions (Signed)
Return to clinic for any scheduled appointments or obstetric concerns, or go to [facility] for evaluation  

## 2018-08-04 ENCOUNTER — Encounter: Payer: Self-pay | Admitting: *Deleted

## 2018-08-04 ENCOUNTER — Other Ambulatory Visit (HOSPITAL_COMMUNITY): Payer: Self-pay | Admitting: *Deleted

## 2018-08-04 DIAGNOSIS — O358XX0 Maternal care for other (suspected) fetal abnormality and damage, not applicable or unspecified: Secondary | ICD-10-CM

## 2018-08-04 DIAGNOSIS — O35DXX0 Maternal care for other (suspected) fetal abnormality and damage, fetal gastrointestinal anomalies, not applicable or unspecified: Secondary | ICD-10-CM

## 2018-08-08 ENCOUNTER — Encounter (HOSPITAL_COMMUNITY): Payer: Self-pay

## 2018-08-08 ENCOUNTER — Other Ambulatory Visit (HOSPITAL_COMMUNITY): Payer: Self-pay | Admitting: *Deleted

## 2018-08-08 ENCOUNTER — Ambulatory Visit (HOSPITAL_COMMUNITY)
Admission: RE | Admit: 2018-08-08 | Discharge: 2018-08-08 | Disposition: A | Discharge status: Home / Self Care | Payer: Self-pay | Source: Ambulatory Visit | Attending: Advanced Practice Midwife | Admitting: Advanced Practice Midwife

## 2018-08-08 DIAGNOSIS — O358XX0 Maternal care for other (suspected) fetal abnormality and damage, not applicable or unspecified: Secondary | ICD-10-CM

## 2018-08-08 DIAGNOSIS — O35DXX0 Maternal care for other (suspected) fetal abnormality and damage, fetal gastrointestinal anomalies, not applicable or unspecified: Secondary | ICD-10-CM

## 2018-08-08 DIAGNOSIS — O0992 Supervision of high risk pregnancy, unspecified, second trimester: Secondary | ICD-10-CM

## 2018-08-08 DIAGNOSIS — Z3A27 27 weeks gestation of pregnancy: Secondary | ICD-10-CM | POA: Insufficient documentation

## 2018-08-08 DIAGNOSIS — Z361 Encounter for antenatal screening for raised alphafetoprotein level: Secondary | ICD-10-CM | POA: Insufficient documentation

## 2018-08-10 ENCOUNTER — Other Ambulatory Visit: Payer: Self-pay | Admitting: *Deleted

## 2018-08-10 DIAGNOSIS — O0990 Supervision of high risk pregnancy, unspecified, unspecified trimester: Secondary | ICD-10-CM

## 2018-08-11 ENCOUNTER — Ambulatory Visit: Payer: Self-pay | Admitting: Clinical

## 2018-08-11 ENCOUNTER — Other Ambulatory Visit: Payer: Self-pay

## 2018-08-11 ENCOUNTER — Ambulatory Visit (INDEPENDENT_AMBULATORY_CARE_PROVIDER_SITE_OTHER): Payer: Self-pay | Admitting: Obstetrics & Gynecology

## 2018-08-11 VITALS — BP 106/62 | HR 77 | Wt 140.7 lb

## 2018-08-11 DIAGNOSIS — O0990 Supervision of high risk pregnancy, unspecified, unspecified trimester: Secondary | ICD-10-CM

## 2018-08-11 DIAGNOSIS — O359XX0 Maternal care for (suspected) fetal abnormality and damage, unspecified, not applicable or unspecified: Secondary | ICD-10-CM

## 2018-08-11 DIAGNOSIS — O9989 Other specified diseases and conditions complicating pregnancy, childbirth and the puerperium: Secondary | ICD-10-CM

## 2018-08-11 DIAGNOSIS — Z23 Encounter for immunization: Secondary | ICD-10-CM

## 2018-08-11 DIAGNOSIS — F419 Anxiety disorder, unspecified: Secondary | ICD-10-CM

## 2018-08-11 NOTE — Progress Notes (Signed)
   PRENATAL VISIT NOTE  Subjective:  Monica [name] is a 22 y.o. G2P1001 at [redacted]w[redacted]d being seen today for ongoing prenatal care.  She is currently monitored for the following issues for this high-risk pregnancy and has Social problem; Supervision of high-risk pregnancy; and Fetal gastroschisis during pregnancy, antepartum on their problem list.  Patient reports feeling anxious about fetal daignosis of gastroschisis.  She very much wants an appt with the pediatric surgeon..  Contractions: Irregular. Vag. Bleeding: None.  Movement: Present. Denies leaking of fluid.   The following portions of the patient's history were reviewed and updated as appropriate: allergies, current medications, past family history, past medical history, past social history, past surgical history and problem list. Problem list updated.  Objective:   Vitals:   08/11/18 0846  BP: 106/62  Pulse: 77  Weight: 140 lb 11.2 oz (63.8 kg)    Fetal Status: Fetal Heart Rate (bpm): 147   Movement: Present     General:  Alert, oriented and cooperative. Patient is in no acute distress.  Skin: Skin is warm and dry. No rash noted.   Cardiovascular: Normal heart rate noted  Respiratory: Normal respiratory effort, no problems with respiration noted  Abdomen: Soft, gravid, appropriate for gestational age.  Pain/Pressure: Present     Pelvic: Cervical exam deferred        Extremities: Normal range of motion.  Edema: None  Mental Status: Normal mood and affect. Normal behavior. Normal judgment and thought content.   Assessment and Plan:  Pregnancy: G2P1001 at [redacted]w[redacted]d  1. Supervision of high risk pregnancy, antepartum -Needs appt with peds surgeon  (appt 08/26/2018) -F/U with Jamie--behavioral health specialist today -28 week labs (GTT)  Preterm labor symptoms and general obstetric precautions including but not limited to vaginal bleeding, contractions, leaking of fluid and fetal movement were reviewed in detail with the  patient. Please refer to After Visit Summary for other counseling recommendations.    Future Appointments  Date Time Provider Department Center  09/05/2018  3:30 PM [facility]-M[facility] [place] 1 WH-M[facility] M[facility]-US    Elsie Lincoln, MD

## 2018-08-11 NOTE — BH Specialist Note (Signed)
Integrated Behavioral Health Follow Up Visit  MRN: [id number] Name: Monica [name]  Number of Integrated Behavioral Health Clinician visits: 2/6 Session Start time: 9:12  Session End time: 9:22 Total time: 15 minutes  Type of Service: Integrated Behavioral Health- Individual/Family Interpretor:No. Interpretor Name and Language: n/a  SUBJECTIVE: Monica [name] is a 22 y.o. female accompanied by n/a Patient was referred by Dwain Sarna, MD for increase in anxiety. Patient reports the following symptoms/concerns: Pt states she feels better now that her family all knows about the baby's condition; attributes increase in anxiety over fear of seeing her baby "with intestines out". Pt says the sleep app is helping to improve her sleep.  Duration of problem: Current pregnancy; Severity of problem: moderate  OBJECTIVE: Mood: Anxious and Affect: Appropriate Risk of harm to self or others: No plan to harm self or others  LIFE CONTEXT: Family and Social: Pt lives with her 3yo son; her mother, grandmother, cousin, and FOB are all supportive School/Work: - Self-Care: Prioritizing focus on eating and sleeping well during pregnancy, and accepting support from family Life Changes: Current pregnancy; fetal gastroschisis diagnosis in pregnancy  GOALS ADDRESSED: Patient will: 1.  Reduce symptoms of: anxiety and stress  2.  Increase knowledge and/or ability of: stress reduction  3.  Demonstrate ability to: Increase healthy adjustment to current life circumstances  INTERVENTIONS: Interventions utilized:  Supportive Counseling Standardized Assessments completed: GAD-7 and PHQ 9  ASSESSMENT: Patient currently experiencing Adjustment disorder with anxious mood  Patient may benefit from brief supportive counseling today.  PLAN: 1. Follow up with behavioral health clinician on : One month 2. Behavioral recommendations:  -Continue prioritizing self-care daily -Talk to medical  providers about preferences in seeing baby before surgery 3. Referral(s): Integrated Behavioral Health Services (In Clinic) 4. "From scale of 1-10, how likely are you to follow plan?": 10  Rae Lips, LCSW  Depression screen [facility] 2/9 08/11/2018 07/13/2018 06/14/2018 04/27/2018  Decreased Interest 1 1 2 1   Down, Depressed, Hopeless 1 1 0 1  PHQ - 2 Score 2 2 2 2   Altered sleeping 1 1 2 1   Tired, decreased energy 1 1 2 1   Change in appetite 0 0 0 1  Feeling bad or failure about yourself  0 0 0 0  Trouble concentrating 0 0 0 0  Moving slowly or fidgety/restless 0 0 0 0  Suicidal thoughts 0 0 0 0  PHQ-9 Score 4 4 6 5   Some encounter information is confidential and restricted. Go to Review Flowsheets activity to see all data.   GAD 7 : Generalized Anxiety Score 08/11/2018 07/13/2018 06/14/2018 04/27/2018  Nervous, Anxious, on Edge 0 0 2 0  Control/stop worrying 3 0 0 0  Worry too much - different things 3 1 2  0  Trouble relaxing 3 0 0 0  Restless 0 0 0 0  Easily annoyed or irritable 3 1 0 1  Afraid - awful might happen 0 0 0 0  Total GAD 7 Score 12 2 4 1   Some encounter information is confidential and restricted. Go to Review Flowsheets activity to see all data.

## 2018-08-12 LAB — GLUCOSE TOLERANCE, 2 HOURS W/ 1HR
GLUCOSE, 1 HOUR: 122 mg/dL (ref 65–179)
GLUCOSE, 2 HOUR: 77 mg/dL (ref 65–152)
GLUCOSE, FASTING: 71 mg/dL (ref 65–91)

## 2018-08-12 LAB — CBC
HEMOGLOBIN: 10.7 g/dL — AB (ref 11.1–15.9)
Hematocrit: 31.5 % — ABNORMAL LOW (ref 34.0–46.6)
MCH: 29.4 pg (ref 26.6–33.0)
MCHC: 34 g/dL (ref 31.5–35.7)
MCV: 87 fL (ref 79–97)
PLATELETS: 235 10*3/uL (ref 150–450)
RBC: 3.64 x10E6/uL — ABNORMAL LOW (ref 3.77–5.28)
RDW: 14 % (ref 12.3–15.4)
WBC: 8.9 10*3/uL (ref 3.4–10.8)

## 2018-08-12 LAB — RPR: RPR: NONREACTIVE

## 2018-08-12 LAB — HIV ANTIBODY (ROUTINE TESTING W REFLEX): HIV Screen 4th Generation wRfx: NONREACTIVE

## 2018-08-25 ENCOUNTER — Encounter: Payer: Self-pay | Admitting: Family Medicine

## 2018-08-25 NOTE — Progress Notes (Signed)
Patient did not keep appointment today. She will be called to reschedule.  

## 2018-08-26 ENCOUNTER — Encounter (INDEPENDENT_AMBULATORY_CARE_PROVIDER_SITE_OTHER): Payer: Self-pay | Admitting: Surgery

## 2018-08-26 ENCOUNTER — Ambulatory Visit (INDEPENDENT_AMBULATORY_CARE_PROVIDER_SITE_OTHER): Payer: Self-pay | Admitting: Surgery

## 2018-08-26 DIAGNOSIS — O35DXX0 Maternal care for other (suspected) fetal abnormality and damage, fetal gastrointestinal anomalies, not applicable or unspecified: Secondary | ICD-10-CM

## 2018-08-26 DIAGNOSIS — O358XX0 Maternal care for other (suspected) fetal abnormality and damage, not applicable or unspecified: Secondary | ICD-10-CM

## 2018-08-26 NOTE — Progress Notes (Signed)
[facility] Pediatric General Surgery    Thank you for th referral of Monica [name].  As you know, she is a 22 y.o. G2P1001 woman who is carrying a fetus at approximately [redacted]w[redacted]d gestation with gastroschisis. I appreciate the chance to see her in prenatal consultation, and we reviewed pertinent surgical issues in reference to prenatal, perinatal, and postnatal issues regarding this fetus.  As you know, Monica [name] is in otherwise good health, with the exception of anxiety. There is no family history of any congenital anomaly.  She has had an uncomplicated pregnancy to date and remains on prenatal vitamins.  Her social history is as follows:   reports that she quit smoking about 2 years ago. She has never used smokeless tobacco. She reports that she does not drink alcohol or use drugs..  She underwent a fetal ultrasound at 27 weeks demonstrating a gastroschisis.  It does not appear to contain liver. A fetal echocardiogram was not performed.  In terms of prenatal issues, I reviewed the etiology of gastroschisis. I reviewed the potential risk for intestinal atresia and intrauterine growth retardation. At this stage in her gestation, there are no other options for prenatal therapies and I have encouraged her to keep up with her scheduled ultrasounds.  In terms of perinatal issues, I will defer all issues regarding management to your expertise.  Finally, in terms of postnatal issues, I will be able to assist with evaluation and care of this baby in consultation with the neonatology staff after birth. The baby will be transferred to the NICU and will undergo a full assortment of supportive measures.  We reviewed surgical repair of the gastroschisis.  The type of repair will depend on the size of the gastroschisis.  We also reviewed the extended amount of time for neonates with gastroschisis to tolerate feeing into the intestines.  Finally, we reviewed survival statistics  and the comorbidities associated with this defect.  I think Monica [name] found this session quite helpful.  It has been a pleasure performing this consultation.  Please do not hesitate to call our office with questions or concerns.  I spent approximately 60 minutes in face-to-face consultation. Greater than 50% of the 60 minute visit was spent in counseling/coordination of care regarding gastroschisis.    Kandice Hams, MD

## 2018-09-05 ENCOUNTER — Ambulatory Visit (HOSPITAL_COMMUNITY)
Admission: RE | Admit: 2018-09-05 | Discharge: 2018-09-05 | Disposition: A | Discharge status: Home / Self Care | Payer: Self-pay | Source: Ambulatory Visit | Attending: Obstetrics & Gynecology | Admitting: Obstetrics & Gynecology

## 2018-09-05 ENCOUNTER — Encounter (HOSPITAL_COMMUNITY): Payer: Self-pay

## 2018-09-05 DIAGNOSIS — O358XX0 Maternal care for other (suspected) fetal abnormality and damage, not applicable or unspecified: Secondary | ICD-10-CM | POA: Insufficient documentation

## 2018-09-05 DIAGNOSIS — Z3A31 31 weeks gestation of pregnancy: Secondary | ICD-10-CM | POA: Insufficient documentation

## 2018-09-05 DIAGNOSIS — O0990 Supervision of high risk pregnancy, unspecified, unspecified trimester: Secondary | ICD-10-CM

## 2018-09-05 DIAGNOSIS — O35DXX0 Maternal care for other (suspected) fetal abnormality and damage, fetal gastrointestinal anomalies, not applicable or unspecified: Secondary | ICD-10-CM

## 2018-09-05 DIAGNOSIS — Z361 Encounter for antenatal screening for raised alphafetoprotein level: Secondary | ICD-10-CM

## 2018-09-06 ENCOUNTER — Other Ambulatory Visit (HOSPITAL_COMMUNITY): Payer: Self-pay | Admitting: *Deleted

## 2018-09-06 DIAGNOSIS — O358XX0 Maternal care for other (suspected) fetal abnormality and damage, not applicable or unspecified: Secondary | ICD-10-CM

## 2018-09-06 DIAGNOSIS — O35DXX0 Maternal care for other (suspected) fetal abnormality and damage, fetal gastrointestinal anomalies, not applicable or unspecified: Secondary | ICD-10-CM

## 2018-09-11 ENCOUNTER — Encounter (HOSPITAL_COMMUNITY): Payer: Self-pay | Admitting: *Deleted

## 2018-09-11 ENCOUNTER — Observation Stay (HOSPITAL_COMMUNITY)
Admission: AD | Admit: 2018-09-11 | Discharge: 2018-09-13 | Disposition: A | Discharge status: Home / Self Care | Payer: Self-pay | Source: Ambulatory Visit | Attending: Obstetrics and Gynecology | Admitting: Obstetrics and Gynecology

## 2018-09-11 DIAGNOSIS — O09299 Supervision of pregnancy with other poor reproductive or obstetric history, unspecified trimester: Secondary | ICD-10-CM | POA: Diagnosis present

## 2018-09-11 DIAGNOSIS — O35DXX0 Maternal care for other (suspected) fetal abnormality and damage, fetal gastrointestinal anomalies, not applicable or unspecified: Secondary | ICD-10-CM

## 2018-09-11 DIAGNOSIS — O4703 False labor before 37 completed weeks of gestation, third trimester: Secondary | ICD-10-CM

## 2018-09-11 DIAGNOSIS — Z3A32 32 weeks gestation of pregnancy: Secondary | ICD-10-CM | POA: Insufficient documentation

## 2018-09-11 DIAGNOSIS — O0990 Supervision of high risk pregnancy, unspecified, unspecified trimester: Secondary | ICD-10-CM

## 2018-09-11 DIAGNOSIS — O358XX0 Maternal care for other (suspected) fetal abnormality and damage, not applicable or unspecified: Secondary | ICD-10-CM

## 2018-09-11 DIAGNOSIS — Q793 Gastroschisis: Secondary | ICD-10-CM | POA: Insufficient documentation

## 2018-09-11 LAB — URINALYSIS, ROUTINE W REFLEX MICROSCOPIC
Bilirubin Urine: NEGATIVE
Glucose, UA: 50 mg/dL — AB
Hgb urine dipstick: NEGATIVE
Ketones, ur: 20 mg/dL — AB
Nitrite: NEGATIVE
Protein, ur: NEGATIVE mg/dL
Specific Gravity, Urine: 1.015 (ref 1.005–1.030)
pH: 6 (ref 5.0–8.0)

## 2018-09-11 LAB — WET PREP, GENITAL
Clue Cells Wet Prep HPF POC: NONE SEEN
Sperm: NONE SEEN
Trich, Wet Prep: NONE SEEN
Yeast Wet Prep HPF POC: NONE SEEN

## 2018-09-11 LAB — FETAL FIBRONECTIN: Fetal Fibronectin: POSITIVE — AB

## 2018-09-11 LAB — AMNISURE RUPTURE OF MEMBRANE (ROM) NOT AT ARMC: Amnisure ROM: NEGATIVE

## 2018-09-11 MED ORDER — FENTANYL CITRATE (PF) 100 MCG/2ML IJ SOLN
50.0000 ug | Freq: Once | INTRAMUSCULAR | Status: AC
  Administered 2018-09-11: 50 ug via INTRAVENOUS
  Filled 2018-09-11 (×2): qty 2

## 2018-09-11 MED ORDER — NIFEDIPINE 10 MG PO CAPS
10.0000 mg | ORAL_CAPSULE | ORAL | Status: AC | PRN
  Administered 2018-09-11 – 2018-09-12 (×4): 10 mg via ORAL
  Filled 2018-09-11 (×4): qty 1

## 2018-09-11 MED ORDER — BETAMETHASONE SOD PHOS & ACET 6 (3-3) MG/ML IJ SUSP
12.0000 mg | Freq: Once | INTRAMUSCULAR | Status: AC
  Administered 2018-09-11: 12 mg via INTRAMUSCULAR
  Filled 2018-09-11: qty 2

## 2018-09-11 MED ORDER — LACTATED RINGERS IV SOLN
INTRAVENOUS | Status: DC
  Administered 2018-09-11: 23:00:00 via INTRAVENOUS

## 2018-09-11 MED ORDER — LACTATED RINGERS IV BOLUS
1000.0000 mL | Freq: Once | INTRAVENOUS | Status: AC
  Administered 2018-09-11: 1000 mL via INTRAVENOUS

## 2018-09-11 NOTE — MAU Note (Signed)
Pt reports contractions that started earlier today. States they have been every 5-10 mins. Pt states she has been leaking fluid 4 days ago. States it is a white/yellow watery discharge. Pt denies itching, odor or irritation. Pt denies vaginal bleeding. Reports good fetal movement.

## 2018-09-12 ENCOUNTER — Ambulatory Visit (HOSPITAL_COMMUNITY)
Admission: RE | Admit: 2018-09-12 | Discharge: 2018-09-12 | Disposition: A | Discharge status: Home / Self Care | Payer: Self-pay | Source: Ambulatory Visit | Attending: Obstetrics & Gynecology | Admitting: Obstetrics & Gynecology

## 2018-09-12 ENCOUNTER — Encounter: Payer: Self-pay | Admitting: Obstetrics & Gynecology

## 2018-09-12 ENCOUNTER — Other Ambulatory Visit: Payer: Self-pay

## 2018-09-12 DIAGNOSIS — O35DXX0 Maternal care for other (suspected) fetal abnormality and damage, fetal gastrointestinal anomalies, not applicable or unspecified: Secondary | ICD-10-CM

## 2018-09-12 DIAGNOSIS — Z3A32 32 weeks gestation of pregnancy: Secondary | ICD-10-CM | POA: Insufficient documentation

## 2018-09-12 DIAGNOSIS — O358XX0 Maternal care for other (suspected) fetal abnormality and damage, not applicable or unspecified: Secondary | ICD-10-CM | POA: Insufficient documentation

## 2018-09-12 DIAGNOSIS — O4703 False labor before 37 completed weeks of gestation, third trimester: Secondary | ICD-10-CM

## 2018-09-12 DIAGNOSIS — O4292 Full-term premature rupture of membranes, unspecified as to length of time between rupture and onset of labor: Secondary | ICD-10-CM

## 2018-09-12 DIAGNOSIS — Z361 Encounter for antenatal screening for raised alphafetoprotein level: Secondary | ICD-10-CM | POA: Insufficient documentation

## 2018-09-12 DIAGNOSIS — O4702 False labor before 37 completed weeks of gestation, second trimester: Secondary | ICD-10-CM

## 2018-09-12 LAB — TYPE AND SCREEN
ABO/RH(D): B POS
Antibody Screen: NEGATIVE

## 2018-09-12 LAB — CBC
HCT: 31 % — ABNORMAL LOW (ref 36.0–46.0)
Hemoglobin: 10.6 g/dL — ABNORMAL LOW (ref 12.0–15.0)
MCH: 28.7 pg (ref 26.0–34.0)
MCHC: 34.2 g/dL (ref 30.0–36.0)
MCV: 84 fL (ref 80.0–100.0)
Platelets: 206 10*3/uL (ref 150–400)
RBC: 3.69 MIL/uL — ABNORMAL LOW (ref 3.87–5.11)
RDW: 13 % (ref 11.5–15.5)
WBC: 8.1 10*3/uL (ref 4.0–10.5)

## 2018-09-12 LAB — GC/CHLAMYDIA PROBE AMP (~~LOC~~) NOT AT ARMC
Chlamydia: NEGATIVE
Neisseria Gonorrhea: NEGATIVE

## 2018-09-12 MED ORDER — PRENATAL MULTIVITAMIN CH
1.0000 | ORAL_TABLET | Freq: Every day | ORAL | Status: DC
  Administered 2018-09-12: 1 via ORAL
  Filled 2018-09-12: qty 1

## 2018-09-12 MED ORDER — NIFEDIPINE ER OSMOTIC RELEASE 30 MG PO TB24
30.0000 mg | ORAL_TABLET | Freq: Once | ORAL | Status: AC
  Administered 2018-09-12: 30 mg via ORAL
  Filled 2018-09-12: qty 1

## 2018-09-12 MED ORDER — ZOLPIDEM TARTRATE 5 MG PO TABS
5.0000 mg | ORAL_TABLET | Freq: Every evening | ORAL | Status: DC | PRN
  Administered 2018-09-12 (×2): 5 mg via ORAL
  Filled 2018-09-12 (×2): qty 1

## 2018-09-12 MED ORDER — DOCUSATE SODIUM 100 MG PO CAPS
100.0000 mg | ORAL_CAPSULE | Freq: Every day | ORAL | Status: DC
  Administered 2018-09-12 – 2018-09-13 (×2): 100 mg via ORAL
  Filled 2018-09-12 (×2): qty 1

## 2018-09-12 MED ORDER — CALCIUM CARBONATE ANTACID 500 MG PO CHEW: 2.0000 | CHEWABLE_TABLET | ORAL | Status: DC | PRN

## 2018-09-12 MED ORDER — LACTATED RINGERS IV SOLN
INTRAVENOUS | Status: DC
  Administered 2018-09-12 – 2018-09-13 (×4): via INTRAVENOUS

## 2018-09-12 MED ORDER — ACETAMINOPHEN 325 MG PO TABS
650.0000 mg | ORAL_TABLET | ORAL | Status: DC | PRN
  Filled 2018-09-12: qty 2

## 2018-09-12 MED ORDER — BETAMETHASONE SOD PHOS & ACET 6 (3-3) MG/ML IJ SUSP
12.0000 mg | Freq: Once | INTRAMUSCULAR | Status: AC
  Administered 2018-09-12: 12 mg via INTRAMUSCULAR
  Filled 2018-09-12: qty 2

## 2018-09-12 NOTE — Progress Notes (Signed)
FACULTY PRACTICE ANTEPARTUM PROGRESS NOTE  Monica [name] is a 22 y.o. G2P1001 at [redacted]w[redacted]d who is admitted for pre-term contractions.  Estimated Date of Delivery: 11/05/18 Fetal presentation is cephalic.  Length of Stay:  0 Days. Admitted 09/11/2018  Subjective:  Patient reports normal fetal movement.  She reports her contractions subsided overnight and she was able to get some sleep, however they have picked up this morning. They are more painful but less frequent than last night. Denies bleeding and leaking of fluid per vagina.  Vitals:  Blood pressure 110/66, pulse 88, temperature (!) 97.3 F (36.3 C), temperature source Oral, resp. rate 18, height 5\' 3"  (1.6 m), weight 64 kg, last menstrual period 01/29/2018, SpO2 100 %, currently breastfeeding. Physical Examination: CONSTITUTIONAL: Well-developed, well-nourished female in no acute distress.  HENT:  Normocephalic, atraumatic, External right and left ear normal. Oropharynx is clear and moist EYES: Conjunctivae and EOM are normal. Pupils are equal, round, and reactive to light. No scleral icterus.  NECK: Normal range of motion, supple, no masses. SKIN: Skin is warm and dry. No rash noted. Not diaphoretic. No erythema. No pallor. NEUROLGIC: Alert and oriented to person, place, and time. Normal reflexes, muscle tone coordination. No cranial nerve deficit noted. PSYCHIATRIC: Normal mood and affect. Normal behavior. Normal judgment and thought content. CARDIOVASCULAR: Normal heart rate noted, regular rhythm RESPIRATORY: Effort and breath sounds normal, no problems with respiration noted MUSCULOSKELETAL: Normal range of motion. No edema and no tenderness. ABDOMEN: Soft, nontender, nondistended, gravid. CERVIX: deferred for now  Fetal monitoring: FHR: 130s bpm, Variability: moderate, Accelerations: Present, Decelerations: Absent  Uterine activity: ctx Q 3-4 min  Results for orders placed or performed during the hospital encounter of  09/11/18 (from the past 48 hour(s))  Urinalysis, Routine w reflex microscopic     Status: Abnormal   Collection Time: 09/11/18  8:38 PM  Result Value Ref Range   Color, Urine YELLOW YELLOW   APPearance HAZY (A) CLEAR   Specific Gravity, Urine 1.015 1.005 - 1.030   pH 6.0 5.0 - 8.0   Glucose, UA 50 (A) NEGATIVE mg/dL   Hgb urine dipstick NEGATIVE NEGATIVE   Bilirubin Urine NEGATIVE NEGATIVE   Ketones, ur 20 (A) NEGATIVE mg/dL   Protein, ur NEGATIVE NEGATIVE mg/dL   Nitrite NEGATIVE NEGATIVE   Leukocytes, UA TRACE (A) NEGATIVE   RBC / HPF 11-20 0 - 5 RBC/hpf   WBC, UA 0-5 0 - 5 WBC/hpf   Bacteria, UA RARE (A) NONE SEEN   Squamous Epithelial / LPF 0-5 0 - 5   Mucus PRESENT     Comment: Performed at [facility], [address], [place], [place] [address]  Wet prep, genital     Status: Abnormal   Collection Time: 09/11/18  9:20 PM  Result Value Ref Range   Yeast Wet Prep HPF POC NONE SEEN NONE SEEN   Trich, Wet Prep NONE SEEN NONE SEEN   Clue Cells Wet Prep HPF POC NONE SEEN NONE SEEN   WBC, Wet Prep HPF POC MODERATE (A) NONE SEEN    Comment: MODERATE BACTERIA SEEN   Sperm NONE SEEN     Comment: Performed at [facility], [address] [place], [place] [address]  Amnisure rupture of membrane (rom)not at [facility]     Status: None   Collection Time: 09/11/18  9:20 PM  Result Value Ref Range   Amnisure ROM NEGATIVE     Comment: Performed at [facility], [address] [place], [place] [address]  Fetal  fibronectin     Status: Abnormal   Collection Time: 09/11/18  9:20 PM  Result Value Ref Range   Fetal Fibronectin POSITIVE (A) NEGATIVE    Comment: Performed at [facility], [address] [place], [place] [address]  Type and screen [facility]     Status: None   Collection Time: 09/12/18  5:25 AM  Result Value Ref Range   ABO/RH(D) B POS    Antibody Screen NEG    Sample Expiration      09/15/2018 Performed at [facility], [address], [place], [place] [address]   CBC     Status: Abnormal   Collection Time: 09/12/18  5:25 AM  Result Value Ref Range   WBC 8.1 4.0 - 10.5 K/uL   RBC 3.69 (L) 3.87 - 5.11 MIL/uL   Hemoglobin 10.6 (L) 12.0 - 15.0 g/dL   HCT 09.8 (L) 11.9 - 14.7 %   MCV 84.0 80.0 - 100.0 fL   MCH 28.7 26.0 - 34.0 pg   MCHC 34.2 30.0 - 36.0 g/dL   RDW 82.9 56.2 - 13.0 %   Platelets 206 150 - 400 K/uL    Comment: Performed at [facility], [address], [place], [place] [address]    No results found.  Current scheduled medications . betamethasone acetate-betamethasone sodium phosphate  12 mg Intramuscular Once  . docusate sodium  100 mg Oral Daily  . prenatal multivitamin  1 tablet Oral Q1200    I have reviewed the patient's current medications.  ASSESSMENT: Active Problems:   Fetal gastroschisis during pregnancy, antepartum   Preterm uterine contractions in third trimester, antepartum   PLAN: Preterm contractions - IVF - s/p procardia 30 mg XL @ 0030 11/4 - will recheck cervix if contractions do not space with IVF  FWB - reactive NST - s/p BTMZ x1 (2nd due 2030 11/4) - NICU consult if continues in labor  Fetal gastroschisis - has seen pediatric surgery (Dr. Gus Puma): baby to NICU after delivery for full supportive measures and will have consult for surgical repair - no c/i to vag del at this point   Continue routine antenatal care.   Baldemar Lenis, M.D. [facility]  09/12/2018 12:06 PM

## 2018-09-12 NOTE — MAU Provider Note (Addendum)
History     CSN: [id number]  Arrival date and time: 09/11/18 2019   First Provider Initiated Contact with Patient 09/11/18 2059      Chief Complaint  Patient presents with  . Contractions   Monica [name] is a 22 y.o. G2P1 at [redacted]w[redacted]d who presents to MAU with complaints of contractions and possible leaking of fluid. She reports contractions started this afternoon. Reports contractions being every 5-10 minutes, rates pain 6/10- reports having to breath through contractions. Denies hx of PTL or PTD with prior pregnancy. She reports increased vaginal discharge and possible leaking fluid for the past 4 days. Describes the discharge as whitish yellow watery discharge, reports wearing a pad today due to continuing having to change her underwear. She denies vaginal bleeding, recent IC, urinary symptoms or N/V. She reports +FM. Pregnancy is complicated by fetal gastroschisis. Patient receives prenatal care at [facility], last visit being 10/3. Has appointment scheduled for tomorrow morning.    OB History    Gravida  2   Para  1   Term  1   Preterm  0   AB  0   Living  1     SAB  0   TAB  0   Ectopic  0   Multiple  0   Live Births  1           Past Medical History:  Diagnosis Date  . Medical history non-contributory     Past Surgical History:  Procedure Laterality Date  . APPENDECTOMY      Family History  Problem Relation Age of Onset  . Diabetes Maternal Grandfather     Social History   Tobacco Use  . Smoking status: Former Smoker    Last attempt to quit: 01/07/2016    Years since quitting: 2.6  . Smokeless tobacco: Never Used  Substance Use Topics  . Alcohol use: No  . Drug use: No    Allergies: No Known Allergies  Medications Prior to Admission  Medication Sig Dispense Refill Last Dose  . Prenatal Vit-Fe Fumarate-FA (PRENATAL COMPLETE) 14-0.4 MG TABS Take one tab daily. 60 each 0 Taking    Review of Systems  Constitutional: Negative.    Respiratory: Negative.   Cardiovascular: Negative.   Gastrointestinal: Positive for abdominal pain. Negative for constipation, diarrhea, nausea and vomiting.  Genitourinary: Positive for vaginal discharge. Negative for difficulty urinating, dysuria, frequency, pelvic pain, urgency and vaginal bleeding.  Musculoskeletal: Negative.   Neurological: Negative.    Physical Exam   Blood pressure 120/65, pulse 93, temperature 97.8 F (36.6 C), temperature source Oral, resp. rate 16, height 5\' 3"  (1.6 m), weight 64 kg, last menstrual period 01/29/2018, SpO2 97 %, currently breastfeeding.  Amnisure and FFN collected prior to pelvic examination   Physical Exam  Nursing note and vitals reviewed. Constitutional: She is oriented to person, place, and time. She appears well-developed and well-nourished. No distress.  Cardiovascular: Normal rate, regular rhythm and normal heart sounds.  Respiratory: Effort normal and breath sounds normal. No respiratory distress. She has no wheezes.  GI:  Gravid, moderate contractions by palpation   Genitourinary: No bleeding in the vagina. Vaginal discharge found.  Genitourinary Comments: Pelvic examination: cervix pink, visually open, scant white thin discharge without odor noted on speculum examination, negative pooling   Neurological: She is alert and oriented to person, place, and time.  Psychiatric: She has a normal mood and affect. Her behavior is normal. Thought content normal.   Cervical examination on arrival to  MAU:  Dilation: 1 Effacement (%): 60 Cervical Position: Posterior  Station: -2 Presentation: Vertex Exam by:: V Farris Blash CNM   FHR: 145/moderate/+accels/ no decelerations  Toco: 2-3 minutes/ moderate by palpation   MAU Course  Procedures  MDM Orders Placed This Encounter  Procedures  . Culture, OB Urine  . Wet prep, genital  . Urinalysis, Routine w reflex microscopic  . Amnisure rupture of membrane (rom)not at [facility]  . Fetal  fibronectin  . Insert peripheral IV   Amnisure and FFN collected prior to pelvic examination  Cervical examination: 1/60/-2  Treatments in MAU included: BMZ initial dose, IV LR bolus and procardia x3 doses   Patient reports continued contractions after initial treatments in MAU. Patient reports contractions are more painful after Procardia, rates pain 7/10 and request pain medication.   Fentanyl IV given @2340 , cervix unchanged on reassessment prior to IV Fentanyl.   Upon cervical reassessment, cervical change noted to 2/60/-2. Patient reports continued uterine contractions and Toco monitor shows contractions 2-3 minutes. FFN positive.   Consult with Dr Alysia Penna on assessment and management. Agrees with admission to antenatal for observation due to continued regular contractions that are painful. Recommends 30XL Procardia prior to transfer to antenatal and maintenance of LR.   Orders placed for admission. Patient aware of plan of care and agrees. Patient scheduled for MFM [place] in the morning, will perform [place] inpatient    Meds ordered this encounter  Medications  . lactated ringers bolus 1,000 mL  . NIFEdipine (PROCARDIA) capsule 10 mg  . betamethasone acetate-betamethasone sodium phosphate (CELESTONE) injection 12 mg  . lactated ringers infusion  . fentaNYL (SUBLIMAZE) injection 50 mcg  . NIFEdipine (PROCARDIA-XL/NIFEDICAL-XL) 24 hr tablet 30 mg   Results for orders placed or performed during the hospital encounter of 09/11/18 (from the past 24 hour(s))  Urinalysis, Routine w reflex microscopic     Status: Abnormal   Collection Time: 09/11/18  8:38 PM  Result Value Ref Range   Color, Urine YELLOW YELLOW   APPearance HAZY (A) CLEAR   Specific Gravity, Urine 1.015 1.005 - 1.030   pH 6.0 5.0 - 8.0   Glucose, UA 50 (A) NEGATIVE mg/dL   Hgb urine dipstick NEGATIVE NEGATIVE   Bilirubin Urine NEGATIVE NEGATIVE   Ketones, ur 20 (A) NEGATIVE mg/dL   Protein, ur NEGATIVE NEGATIVE mg/dL    Nitrite NEGATIVE NEGATIVE   Leukocytes, UA TRACE (A) NEGATIVE   RBC / HPF 11-20 0 - 5 RBC/hpf   WBC, UA 0-5 0 - 5 WBC/hpf   Bacteria, UA RARE (A) NONE SEEN   Squamous Epithelial / LPF 0-5 0 - 5   Mucus PRESENT   Wet prep, genital     Status: Abnormal   Collection Time: 09/11/18  9:20 PM  Result Value Ref Range   Yeast Wet Prep HPF POC NONE SEEN NONE SEEN   Trich, Wet Prep NONE SEEN NONE SEEN   Clue Cells Wet Prep HPF POC NONE SEEN NONE SEEN   WBC, Wet Prep HPF POC MODERATE (A) NONE SEEN   Sperm NONE SEEN   Amnisure rupture of membrane (rom)not at [facility]     Status: None   Collection Time: 09/11/18  9:20 PM  Result Value Ref Range   Amnisure ROM NEGATIVE   Fetal fibronectin     Status: Abnormal   Collection Time: 09/11/18  9:20 PM  Result Value Ref Range   Fetal Fibronectin POSITIVE (A) NEGATIVE   Assessment and Plan   1. Preterm  uterine contractions in third trimester, antepartum   2. Fetal gastroschisis during pregnancy, antepartum, single or unspecified fetus    Admit to Antenatal for observation  Orders placed for admission  Care taken over by Dr Alysia Penna  MFM [place] in the morning  Procardia 30XL  BMZ second dose this evening   Sharyon Cable CNM 09/12/2018, 12:50 AM

## 2018-09-13 NOTE — Discharge Summary (Signed)
Antenatal Physician Discharge Summary  Patient ID: Monica [name] MRN: [id number] DOB/AGE: 22-20-1997 22 y.o.  Admit date: 09/11/2018 Discharge date: 09/13/2018  Admission Diagnoses: threatened pre-term labor  Discharge Diagnoses:  Principal Problem:   Preterm uterine contractions in third trimester, antepartum Active Problems:   Supervision of high-risk pregnancy   Fetal gastroschisis during pregnancy, antepartum   Prenatal Procedures: NST and ultrasound  Consults: Maternal Fetal Medicine  Hospital Course:  This is a 22 y.o. G2P1001 with IUP at [redacted]w[redacted]d admitted for pre-term contractions, noted to have a cervical exam of 1/60/-3 that changed to 2/60/-3.  No leaking of fluid and no bleeding. She betamethasone x 2 doses, IVF and procardia 30 mg XL x1. She was observed, fetal heart rate monitoring remained reassuring, and she had no signs/symptoms of progressing preterm labor or other maternal-fetal concerns. Her [place] was reassuring with BPP 8/8. On HD#3, she reports contractions every 30 min or so that are much less painful than they were on admission and her cervical exam was unchanged from admission.  She was deemed stable for discharge to home with outpatient follow up. Has appt tomorrow, reviewed reasons to return to MAU. She verbalized understanding of the above, answered all questions.  Discharge Exam: Temp:  [97.3 F (36.3 C)-98.7 F (37.1 C)] 97.6 F (36.4 C) (11/05 0851) Pulse Rate:  [72-100] 72 (11/05 0851) Resp:  [16-18] 18 (11/05 0851) BP: (99-113)/(41-66) 101/48 (11/05 0851) SpO2:  [98 %-100 %] 99 % (11/05 0851) Physical Examination: CONSTITUTIONAL: Well-developed, well-nourished female in no acute distress.  HENT:  Normocephalic, atraumatic, External right and left ear normal. Oropharynx is clear and moist EYES: Conjunctivae and EOM are normal. Pupils are equal, round, and reactive to light. No scleral icterus.  NECK: Normal range of motion, supple, no masses SKIN:  Skin is warm and dry. No rash noted. Not diaphoretic. No erythema. No pallor. NEUROLGIC: Alert and oriented to person, place, and time. Normal reflexes, muscle tone coordination. No cranial nerve deficit noted. PSYCHIATRIC: Normal mood and affect. Normal behavior. Normal judgment and thought content. CARDIOVASCULAR: Normal heart rate noted, regular rhythm RESPIRATORY: Effort and breath sounds normal, no problems with respiration noted MUSCULOSKELETAL: Normal range of motion. No edema and no tenderness. 2+ distal pulses. ABDOMEN: Soft, nontender, nondistended, gravid. CERVIX: 1/-60/-3  Fetal monitoring: FHR: 140 bpm, Variability: moderate, Accelerations: Present, Decelerations: Absent  Uterine activity: occasional contractions  Significant Diagnostic Studies:  Results for orders placed or performed during the hospital encounter of 09/11/18 (from the past 168 hour(s))  GC/Chlamydia probe amp ([place])not at [facility]   Collection Time: 09/11/18 12:00 AM  Result Value Ref Range   Chlamydia Negative    Neisseria gonorrhea Negative   Urinalysis, Routine w reflex microscopic   Collection Time: 09/11/18  8:38 PM  Result Value Ref Range   Color, Urine YELLOW YELLOW   APPearance HAZY (A) CLEAR   Specific Gravity, Urine 1.015 1.005 - 1.030   pH 6.0 5.0 - 8.0   Glucose, UA 50 (A) NEGATIVE mg/dL   Hgb urine dipstick NEGATIVE NEGATIVE   Bilirubin Urine NEGATIVE NEGATIVE   Ketones, ur 20 (A) NEGATIVE mg/dL   Protein, ur NEGATIVE NEGATIVE mg/dL   Nitrite NEGATIVE NEGATIVE   Leukocytes, UA TRACE (A) NEGATIVE   RBC / HPF 11-20 0 - 5 RBC/hpf   WBC, UA 0-5 0 - 5 WBC/hpf   Bacteria, UA RARE (A) NONE SEEN   Squamous Epithelial / LPF 0-5 0 - 5   Mucus PRESENT   Wet prep,  genital   Collection Time: 09/11/18  9:20 PM  Result Value Ref Range   Yeast Wet Prep HPF POC NONE SEEN NONE SEEN   Trich, Wet Prep NONE SEEN NONE SEEN   Clue Cells Wet Prep HPF POC NONE SEEN NONE SEEN   WBC, Wet Prep HPF POC  MODERATE (A) NONE SEEN   Sperm NONE SEEN   Amnisure rupture of membrane (rom)not at [facility]   Collection Time: 09/11/18  9:20 PM  Result Value Ref Range   Amnisure ROM NEGATIVE   Fetal fibronectin   Collection Time: 09/11/18  9:20 PM  Result Value Ref Range   Fetal Fibronectin POSITIVE (A) NEGATIVE  CBC   Collection Time: 09/12/18  5:25 AM  Result Value Ref Range   WBC 8.1 4.0 - 10.5 K/uL   RBC 3.69 (L) 3.87 - 5.11 MIL/uL   Hemoglobin 10.6 (L) 12.0 - 15.0 g/dL   HCT 16.1 (L) 09.6 - 04.5 %   MCV 84.0 80.0 - 100.0 fL   MCH 28.7 26.0 - 34.0 pg   MCHC 34.2 30.0 - 36.0 g/dL   RDW 40.9 81.1 - 91.4 %   Platelets 206 150 - 400 K/uL  Type and screen [facility]   Collection Time: 09/12/18  5:25 AM  Result Value Ref Range   ABO/RH(D) B POS    Antibody Screen NEG    Sample Expiration      09/15/2018 Performed at [facility], [address] [place], [place] [address]     Discharge Condition: Stable  Disposition: Discharge disposition: 01-Home or Self Care        Discharge Instructions    Discharge activity:  No Restrictions   Complete by:  As directed    Do not have sex or do anything that might make you have an orgasm   Complete by:  As directed    Fetal Kick Count:  Lie on our left side for one hour after a meal, and count the number of times your baby kicks.  If it is less than 5 times, get up, move around and drink some juice.  Repeat the test 30 minutes later.  If it is still less than 5 kicks in an hour, notify your doctor.   Complete by:  As directed    Notify physician for a general feeling that "something is not right"   Complete by:  As directed    Notify physician for increase or change in vaginal discharge   Complete by:  As directed    Notify physician for intestinal cramps, with or without diarrhea, sometimes described as "gas pain"   Complete by:  As directed    Notify physician for leaking of fluid   Complete by:  As directed     Notify physician for low, dull backache, unrelieved by heat or Tylenol   Complete by:  As directed    Notify physician for menstrual like cramps   Complete by:  As directed    Notify physician for pelvic pressure   Complete by:  As directed    Notify physician for uterine contractions.  These may be painless and feel like the uterus is tightening or the baby is  "balling up"   Complete by:  As directed    Notify physician for vaginal bleeding   Complete by:  As directed    PRETERM LABOR:  Includes any of the follwing symptoms that occur between 20 - [redacted] weeks gestation.  If these symptoms are  not stopped, preterm labor can result in preterm delivery, placing your baby at risk   Complete by:  As directed      Allergies as of 09/13/2018   No Known Allergies     Medication List    TAKE these medications   acetaminophen 325 MG tablet Commonly known as:  TYLENOL Take 650 mg by mouth every 6 (six) hours as needed for mild pain.   PRENATAL COMPLETE 14-0.4 MG Tabs Take one tab daily. What changed:    how much to take  how to take this  when to take this  additional instructions      Follow-up Information    Center for [facility]. Go on 09/14/2018.   Specialty:  Obstetrics and Gynecology Contact information: [address] [place] [address] [phone]          Signed: Baldemar Lenis, M.D. Center for [facility]  09/13/2018, 11:13 AM

## 2018-09-13 NOTE — Progress Notes (Signed)
Discharge instructions reviewed with patient.  Discussed signs of preterm labor, no medication changes and follow up appointment.  Patient verbalized understanding.

## 2018-09-13 NOTE — Discharge Instructions (Signed)
Braxton Hicks Contractions °Contractions of the uterus can occur throughout pregnancy, but they are not always a sign that you are in labor. You may have practice contractions called Braxton Hicks contractions. These false labor contractions are sometimes confused with true labor. °What are Braxton Hicks contractions? °Braxton Hicks contractions are tightening movements that occur in the muscles of the uterus before labor. Unlike true labor contractions, these contractions do not result in opening (dilation) and thinning of the cervix. Toward the end of pregnancy (32-34 weeks), Braxton Hicks contractions can happen more often and may become stronger. These contractions are sometimes difficult to tell apart from true labor because they can be very uncomfortable. You should not feel embarrassed if you go to the hospital with false labor. °Sometimes, the only way to tell if you are in true labor is for your health care provider to look for changes in the cervix. The health care provider will do a physical exam and may monitor your contractions. If you are not in true labor, the exam should show that your cervix is not dilating and your water has not broken. °If there are other health problems associated with your pregnancy, it is completely safe for you to be sent home with false labor. You may continue to have Braxton Hicks contractions until you go into true labor. °How to tell the difference between true labor and false labor °True labor °· Contractions last 30-70 seconds. °· Contractions become very regular. °· Discomfort is usually felt in the top of the uterus, and it spreads to the lower abdomen and low back. °· Contractions do not go away with walking. °· Contractions usually become more intense and increase in frequency. °· The cervix dilates and gets thinner. °False labor °· Contractions are usually shorter and not as strong as true labor contractions. °· Contractions are usually irregular. °· Contractions  are often felt in the front of the lower abdomen and in the groin. °· Contractions may go away when you walk around or change positions while lying down. °· Contractions get weaker and are shorter-lasting as time goes on. °· The cervix usually does not dilate or become thin. °Follow these instructions at home: °· Take over-the-counter and prescription medicines only as told by your health care provider. °· Keep up with your usual exercises and follow other instructions from your health care provider. °· Eat and drink lightly if you think you are going into labor. °· If Braxton Hicks contractions are making you uncomfortable: °? Change your position from lying down or resting to walking, or change from walking to resting. °? Sit and rest in a tub of warm water. °? Drink enough fluid to keep your urine pale yellow. Dehydration may cause these contractions. °? Do slow and deep breathing several times an hour. °· Keep all follow-up prenatal visits as told by your health care provider. This is important. °Contact a health care provider if: °· You have a fever. °· You have continuous pain in your abdomen. °Get help right away if: °· Your contractions become stronger, more regular, and closer together. °· You have fluid leaking or gushing from your vagina. °· You pass blood-tinged mucus (bloody show). °· You have bleeding from your vagina. °· You have low back pain that you never had before. °· You feel your baby’s head pushing down and causing pelvic pressure. °· Your baby is not moving inside you as much as it used to. °Summary °· Contractions that occur before labor are called Braxton   Hicks contractions, false labor, or practice contractions. °· Braxton Hicks contractions are usually shorter, weaker, farther apart, and less regular than true labor contractions. True labor contractions usually become progressively stronger and regular and they become more frequent. °· Manage discomfort from Braxton Hicks contractions by  changing position, resting in a warm bath, drinking plenty of water, or practicing deep breathing. °This information is not intended to replace advice given to you by your health care provider. Make sure you discuss any questions you have with your health care provider. °Document Released: 03/11/2017 Document Revised: 03/11/2017 Document Reviewed: 03/11/2017 °Elsevier Interactive Patient Education © 2018 [employer] ° °

## 2018-09-14 ENCOUNTER — Other Ambulatory Visit: Payer: Self-pay | Admitting: Certified Nurse Midwife

## 2018-09-14 ENCOUNTER — Encounter: Payer: Self-pay | Admitting: Family Medicine

## 2018-09-14 DIAGNOSIS — O2343 Unspecified infection of urinary tract in pregnancy, third trimester: Secondary | ICD-10-CM

## 2018-09-14 LAB — CULTURE, OB URINE: Culture: 40000 — AB

## 2018-09-14 MED ORDER — CEPHALEXIN 500 MG PO CAPS: 500.0000 mg | ORAL_CAPSULE | Freq: Four times a day (QID) | ORAL | 0 refills | Status: DC

## 2018-09-14 NOTE — Progress Notes (Signed)
Patient did not keep appointment today. She will be called to reschedule.  

## 2018-09-14 NOTE — Progress Notes (Signed)
Patient aware of results. Rx sent for Keflex sent to pharmacy of choice.   Sharyon Cable, CNM 09/14/18, 8:01 AM

## 2018-09-19 ENCOUNTER — Ambulatory Visit (HOSPITAL_COMMUNITY)
Admission: RE | Admit: 2018-09-19 | Discharge: 2018-09-19 | Disposition: A | Discharge status: Home / Self Care | Payer: Self-pay | Source: Ambulatory Visit | Attending: Obstetrics & Gynecology | Admitting: Obstetrics & Gynecology

## 2018-09-19 ENCOUNTER — Encounter (HOSPITAL_COMMUNITY): Payer: Self-pay

## 2018-09-19 DIAGNOSIS — Z3A33 33 weeks gestation of pregnancy: Secondary | ICD-10-CM | POA: Insufficient documentation

## 2018-09-19 DIAGNOSIS — O35DXX0 Maternal care for other (suspected) fetal abnormality and damage, fetal gastrointestinal anomalies, not applicable or unspecified: Secondary | ICD-10-CM

## 2018-09-19 DIAGNOSIS — O358XX0 Maternal care for other (suspected) fetal abnormality and damage, not applicable or unspecified: Secondary | ICD-10-CM | POA: Insufficient documentation

## 2018-09-19 DIAGNOSIS — Z361 Encounter for antenatal screening for raised alphafetoprotein level: Secondary | ICD-10-CM

## 2018-09-19 DIAGNOSIS — O0990 Supervision of high risk pregnancy, unspecified, unspecified trimester: Secondary | ICD-10-CM

## 2018-09-26 ENCOUNTER — Other Ambulatory Visit (HOSPITAL_COMMUNITY): Payer: Self-pay | Admitting: Obstetrics and Gynecology

## 2018-09-26 ENCOUNTER — Ambulatory Visit (HOSPITAL_COMMUNITY)
Admission: RE | Admit: 2018-09-26 | Discharge: 2018-09-26 | Disposition: A | Discharge status: Home / Self Care | Payer: Self-pay | Source: Ambulatory Visit | Attending: Obstetrics & Gynecology | Admitting: Obstetrics & Gynecology

## 2018-09-26 ENCOUNTER — Encounter (HOSPITAL_COMMUNITY): Payer: Self-pay

## 2018-09-26 DIAGNOSIS — O358XX0 Maternal care for other (suspected) fetal abnormality and damage, not applicable or unspecified: Secondary | ICD-10-CM

## 2018-09-26 DIAGNOSIS — O35DXX0 Maternal care for other (suspected) fetal abnormality and damage, fetal gastrointestinal anomalies, not applicable or unspecified: Secondary | ICD-10-CM

## 2018-09-26 DIAGNOSIS — Z362 Encounter for other antenatal screening follow-up: Secondary | ICD-10-CM | POA: Insufficient documentation

## 2018-09-26 DIAGNOSIS — Z3A34 34 weeks gestation of pregnancy: Secondary | ICD-10-CM | POA: Insufficient documentation

## 2018-09-26 DIAGNOSIS — O289 Unspecified abnormal findings on antenatal screening of mother: Secondary | ICD-10-CM | POA: Insufficient documentation

## 2018-09-26 DIAGNOSIS — O0990 Supervision of high risk pregnancy, unspecified, unspecified trimester: Secondary | ICD-10-CM

## 2018-09-26 DIAGNOSIS — O365930 Maternal care for other known or suspected poor fetal growth, third trimester, not applicable or unspecified: Secondary | ICD-10-CM | POA: Insufficient documentation

## 2018-09-26 NOTE — Progress Notes (Signed)
   PRENATAL VISIT NOTE  Subjective:  Monica [name] is a 22 y.o. G2P1001 at 7252w3d being seen today for ongoing prenatal care.  She is currently monitored for the following issues for this high-risk pregnancy and has Social problem; Supervision of high-risk pregnancy; Fetal gastroschisis during pregnancy, antepartum; Preterm uterine contractions in third trimester, antepartum; and Urinary tract infection during pregnancy in third trimester, antepartum on their problem list.  Patient reports no complaints.  Contractions: Not present. Vag. Bleeding: None.  Movement: Present. Denies leaking of fluid.   The following portions of the patient's history were reviewed and updated as appropriate: allergies, current medications, past family history, past medical history, past social history, past surgical history and problem list. Problem list updated.  Objective:   Vitals:   09/27/18 0908  BP: 118/69  Pulse: 78  Weight: 144 lb 4.8 oz (65.5 kg)    Fetal Status: Fetal Heart Rate (bpm): 150   Movement: Present     General:  Alert, oriented and cooperative. Patient is in no acute distress.  Skin: Skin is warm and dry. No rash noted.   Cardiovascular: Normal heart rate noted  Respiratory: Normal respiratory effort, no problems with respiration noted  Abdomen: Soft, gravid, appropriate for gestational age.  Pain/Pressure: Absent     Pelvic: Cervical exam deferred        Extremities: Normal range of motion.  Edema: None  Mental Status: Normal mood and affect. Normal behavior. Normal judgment and thought content.   Assessment and Plan:  Pregnancy: G2P1001 at 2760w2d  1. Anemia complicating pregnancy, third trimester  - Ferritin  2. Fetal gastroschisis during pregnancy, antepartum, single or unspecified fetus - Plan   3. Supervision of high risk pregnancy in third trimester   4. Preterm uterine contractions in third trimester, antepartum - Improved   5. Fetal Growth Restriction -  BPP/NST BID - Delivery at 37 weeks  Preterm labor symptoms and general obstetric precautions including but not limited to vaginal bleeding, contractions, leaking of fluid and fetal movement were reviewed in detail with the patient. Please refer to After Visit Summary for other counseling recommendations.  Return in about 2 weeks (around 10/11/2018) for ROB.  Future Appointments  Date Time Provider Department Center  09/29/2018  2:00 PM WH-MFC US 3 WH-MFCUS MFC-US  10/03/2018  1:00 PM WH-MFC US 3 WH-MFCUS MFC-US  10/07/2018  8:00 AM WH-MFC US 3 WH-MFCUS MFC-US    Dorathy KinsmanVirginia Cylie Dor, CNM

## 2018-09-26 NOTE — ED Notes (Signed)
Patient has never taken med for UTI. She stated at prev visit that she was not notified of UTI and med prescribed. Was advised to pick up med from pharmacy and take as directed. When patient went to pharmacy, she was told the med is no longer available for her. Patient states she does not have an appointment scheduled in clinic, but plans to go there when she leaves here to schedule appt and ask about medication.

## 2018-09-27 ENCOUNTER — Ambulatory Visit (INDEPENDENT_AMBULATORY_CARE_PROVIDER_SITE_OTHER): Payer: Self-pay | Admitting: Advanced Practice Midwife

## 2018-09-27 ENCOUNTER — Other Ambulatory Visit (HOSPITAL_COMMUNITY): Payer: Self-pay | Admitting: *Deleted

## 2018-09-27 VITALS — BP 118/69 | HR 78 | Wt 144.3 lb

## 2018-09-27 DIAGNOSIS — O99013 Anemia complicating pregnancy, third trimester: Secondary | ICD-10-CM

## 2018-09-27 DIAGNOSIS — O4703 False labor before 37 completed weeks of gestation, third trimester: Secondary | ICD-10-CM

## 2018-09-27 DIAGNOSIS — Z3A34 34 weeks gestation of pregnancy: Secondary | ICD-10-CM

## 2018-09-27 DIAGNOSIS — IMO0001 Reserved for inherently not codable concepts without codable children: Secondary | ICD-10-CM

## 2018-09-27 DIAGNOSIS — O35DXX0 Maternal care for other (suspected) fetal abnormality and damage, fetal gastrointestinal anomalies, not applicable or unspecified: Secondary | ICD-10-CM

## 2018-09-27 DIAGNOSIS — O358XX0 Maternal care for other (suspected) fetal abnormality and damage, not applicable or unspecified: Secondary | ICD-10-CM

## 2018-09-27 DIAGNOSIS — O0993 Supervision of high risk pregnancy, unspecified, third trimester: Secondary | ICD-10-CM

## 2018-09-27 DIAGNOSIS — O2343 Unspecified infection of urinary tract in pregnancy, third trimester: Secondary | ICD-10-CM

## 2018-09-27 MED ORDER — CEPHALEXIN 500 MG PO CAPS: 500.0000 mg | ORAL_CAPSULE | Freq: Four times a day (QID) | ORAL | 0 refills | Status: DC

## 2018-09-27 NOTE — Patient Instructions (Signed)

## 2018-09-27 NOTE — Progress Notes (Signed)
LM for Heather to schedule a NICU tour.

## 2018-09-28 LAB — FERRITIN: FERRITIN: 13 ng/mL — AB (ref 15–150)

## 2018-09-29 ENCOUNTER — Ambulatory Visit (HOSPITAL_COMMUNITY)
Admission: RE | Admit: 2018-09-29 | Discharge: 2018-09-29 | Disposition: A | Discharge status: Home / Self Care | Payer: Self-pay | Source: Ambulatory Visit | Attending: Obstetrics & Gynecology | Admitting: Obstetrics & Gynecology

## 2018-09-29 ENCOUNTER — Encounter (HOSPITAL_COMMUNITY): Payer: Self-pay

## 2018-09-29 DIAGNOSIS — O358XX0 Maternal care for other (suspected) fetal abnormality and damage, not applicable or unspecified: Secondary | ICD-10-CM | POA: Insufficient documentation

## 2018-09-29 DIAGNOSIS — Z3A34 34 weeks gestation of pregnancy: Secondary | ICD-10-CM | POA: Insufficient documentation

## 2018-09-29 DIAGNOSIS — O35DXX0 Maternal care for other (suspected) fetal abnormality and damage, fetal gastrointestinal anomalies, not applicable or unspecified: Secondary | ICD-10-CM

## 2018-09-29 DIAGNOSIS — O289 Unspecified abnormal findings on antenatal screening of mother: Secondary | ICD-10-CM

## 2018-09-29 DIAGNOSIS — Z362 Encounter for other antenatal screening follow-up: Secondary | ICD-10-CM | POA: Insufficient documentation

## 2018-09-29 DIAGNOSIS — O365930 Maternal care for other known or suspected poor fetal growth, third trimester, not applicable or unspecified: Secondary | ICD-10-CM | POA: Insufficient documentation

## 2018-09-29 DIAGNOSIS — IMO0001 Reserved for inherently not codable concepts without codable children: Secondary | ICD-10-CM

## 2018-09-29 DIAGNOSIS — O0993 Supervision of high risk pregnancy, unspecified, third trimester: Secondary | ICD-10-CM

## 2018-09-29 NOTE — Procedures (Signed)
Monica [name] 1996-07-22 [id number]  Fetus A Non-Stress Test Interpretation for 09/29/18  Indication: fetal gastroschisis  Fetal Heart Rate A Mode: External Baseline Rate (A): 145 bpm Variability: Moderate Accelerations: 15 x 15 Decelerations: None  Uterine Activity Mode: Toco Contraction Frequency (min): irreg UC Contraction Duration (sec): 60-100 Contraction Quality: Mild Resting Tone Palpated: Relaxed Resting Time: Adequate  Interpretation (Fetal Testing) Nonstress Test Interpretation: Reactive Comments: FHR tracing rev'd by Dr. Judeth CornfieldShankar

## 2018-10-03 ENCOUNTER — Encounter (HOSPITAL_COMMUNITY): Payer: Self-pay

## 2018-10-03 ENCOUNTER — Ambulatory Visit (HOSPITAL_COMMUNITY)
Admission: RE | Admit: 2018-10-03 | Discharge: 2018-10-03 | Disposition: A | Discharge status: Home / Self Care | Payer: Self-pay | Source: Ambulatory Visit | Attending: Obstetrics & Gynecology | Admitting: Obstetrics & Gynecology

## 2018-10-03 DIAGNOSIS — O358XX0 Maternal care for other (suspected) fetal abnormality and damage, not applicable or unspecified: Secondary | ICD-10-CM

## 2018-10-03 DIAGNOSIS — O35DXX0 Maternal care for other (suspected) fetal abnormality and damage, fetal gastrointestinal anomalies, not applicable or unspecified: Secondary | ICD-10-CM

## 2018-10-03 DIAGNOSIS — O289 Unspecified abnormal findings on antenatal screening of mother: Secondary | ICD-10-CM

## 2018-10-03 DIAGNOSIS — O365930 Maternal care for other known or suspected poor fetal growth, third trimester, not applicable or unspecified: Secondary | ICD-10-CM | POA: Insufficient documentation

## 2018-10-03 DIAGNOSIS — Z3A35 35 weeks gestation of pregnancy: Secondary | ICD-10-CM | POA: Insufficient documentation

## 2018-10-03 DIAGNOSIS — Z362 Encounter for other antenatal screening follow-up: Secondary | ICD-10-CM

## 2018-10-03 DIAGNOSIS — O0993 Supervision of high risk pregnancy, unspecified, third trimester: Secondary | ICD-10-CM

## 2018-10-03 DIAGNOSIS — IMO0001 Reserved for inherently not codable concepts without codable children: Secondary | ICD-10-CM

## 2018-10-03 NOTE — Procedures (Signed)
Monica RichFrida [name] 22-Feb-1996 6840w2d  Fetus A Non-Stress Test Interpretation for 10/03/18  Indication: fetal gastroschisis  Fetal Heart Rate A Mode: External Baseline Rate (A): 145 bpm Variability: Moderate Accelerations: 15 x 15 Decelerations: None Multiple birth?: No  Uterine Activity Mode: Toco Contraction Frequency (min): irreg UC noted Contraction Duration (sec): 40-60 Contraction Quality: Mild Resting Tone Palpated: Relaxed Resting Time: Adequate  Interpretation (Fetal Testing) Nonstress Test Interpretation: Reactive Comments: FHR tracing rev'd by Dr. Judeth CornfieldShankar

## 2018-10-03 NOTE — Progress Notes (Signed)
Message left on NICU NAS line to schedule tour with patient's next appointment.  Patient needs to be set up with Neonatology Consult rather than NICU tour. Please call [phone] for the level II Neonatologist for consultation during an upcoming MFM or OB appointment. Thank you.

## 2018-10-07 ENCOUNTER — Ambulatory Visit (HOSPITAL_COMMUNITY): Admission: RE | Admit: 2018-10-07 | Payer: Self-pay | Source: Ambulatory Visit

## 2018-10-11 ENCOUNTER — Ambulatory Visit (HOSPITAL_COMMUNITY)
Admission: RE | Admit: 2018-10-11 | Discharge: 2018-10-11 | Disposition: A | Discharge status: Home / Self Care | Payer: Self-pay | Source: Ambulatory Visit | Attending: Obstetrics and Gynecology | Admitting: Obstetrics and Gynecology

## 2018-10-11 ENCOUNTER — Encounter (HOSPITAL_COMMUNITY): Payer: Self-pay

## 2018-10-11 DIAGNOSIS — O289 Unspecified abnormal findings on antenatal screening of mother: Secondary | ICD-10-CM | POA: Insufficient documentation

## 2018-10-11 DIAGNOSIS — O0993 Supervision of high risk pregnancy, unspecified, third trimester: Secondary | ICD-10-CM

## 2018-10-11 DIAGNOSIS — Z3A36 36 weeks gestation of pregnancy: Secondary | ICD-10-CM

## 2018-10-11 DIAGNOSIS — O358XX0 Maternal care for other (suspected) fetal abnormality and damage, not applicable or unspecified: Secondary | ICD-10-CM

## 2018-10-11 DIAGNOSIS — O35DXX0 Maternal care for other (suspected) fetal abnormality and damage, fetal gastrointestinal anomalies, not applicable or unspecified: Secondary | ICD-10-CM

## 2018-10-11 DIAGNOSIS — O365930 Maternal care for other known or suspected poor fetal growth, third trimester, not applicable or unspecified: Secondary | ICD-10-CM

## 2018-10-11 DIAGNOSIS — IMO0001 Reserved for inherently not codable concepts without codable children: Secondary | ICD-10-CM

## 2018-10-11 NOTE — Procedures (Signed)
Blima RichFrida Chohan Jul 18, 1996 7130w3d  Fetus A Non-Stress Test Interpretation for 10/11/18  Indication: IUGR  Fetal Heart Rate A Mode: External Baseline Rate (A): 140 bpm Variability: Moderate Accelerations: 10 x 10, 15 x 15 Decelerations: Variable Multiple birth?: No  Uterine Activity Mode: Palpation, Toco Contraction Frequency (min): 4-6 Contraction Duration (sec): 40-50 Contraction Quality: Mild Resting Tone Palpated: Relaxed Resting Time: Adequate  Interpretation (Fetal Testing) Nonstress Test Interpretation: Reactive Comments: FHR tracing rev'd by Dr. Judeth CornfieldShankar

## 2018-10-12 ENCOUNTER — Inpatient Hospital Stay (HOSPITAL_COMMUNITY)
Admission: AD | Admit: 2018-10-12 | Discharge: 2018-10-13 | DRG: 805 | Disposition: A | Discharge status: Home / Self Care | Payer: Medicaid Other | Attending: Obstetrics & Gynecology | Admitting: Obstetrics & Gynecology

## 2018-10-12 ENCOUNTER — Other Ambulatory Visit: Payer: Self-pay

## 2018-10-12 ENCOUNTER — Encounter: Payer: Self-pay | Admitting: Obstetrics & Gynecology

## 2018-10-12 ENCOUNTER — Encounter (HOSPITAL_COMMUNITY): Payer: Self-pay

## 2018-10-12 DIAGNOSIS — Z87891 Personal history of nicotine dependence: Secondary | ICD-10-CM

## 2018-10-12 DIAGNOSIS — O35DXX0 Maternal care for other (suspected) fetal abnormality and damage, fetal gastrointestinal anomalies, not applicable or unspecified: Secondary | ICD-10-CM

## 2018-10-12 DIAGNOSIS — O99824 Streptococcus B carrier state complicating childbirth: Secondary | ICD-10-CM | POA: Diagnosis present

## 2018-10-12 DIAGNOSIS — O42913 Preterm premature rupture of membranes, unspecified as to length of time between rupture and onset of labor, third trimester: Principal | ICD-10-CM | POA: Diagnosis present

## 2018-10-12 DIAGNOSIS — O0993 Supervision of high risk pregnancy, unspecified, third trimester: Secondary | ICD-10-CM

## 2018-10-12 DIAGNOSIS — Z3A36 36 weeks gestation of pregnancy: Secondary | ICD-10-CM

## 2018-10-12 DIAGNOSIS — O4290 Premature rupture of membranes, unspecified as to length of time between rupture and onset of labor, unspecified weeks of gestation: Secondary | ICD-10-CM | POA: Diagnosis present

## 2018-10-12 DIAGNOSIS — O358XX0 Maternal care for other (suspected) fetal abnormality and damage, not applicable or unspecified: Secondary | ICD-10-CM | POA: Diagnosis present

## 2018-10-12 LAB — TYPE AND SCREEN
ABO/RH(D): B POS
Antibody Screen: NEGATIVE

## 2018-10-12 LAB — URINALYSIS, ROUTINE W REFLEX MICROSCOPIC
BILIRUBIN URINE: NEGATIVE
GLUCOSE, UA: NEGATIVE mg/dL
KETONES UR: NEGATIVE mg/dL
LEUKOCYTES UA: NEGATIVE
Nitrite: NEGATIVE
PH: 7 (ref 5.0–8.0)
PROTEIN: NEGATIVE mg/dL
Specific Gravity, Urine: 1.015 (ref 1.005–1.030)

## 2018-10-12 LAB — CBC
HCT: 36.2 % (ref 36.0–46.0)
Hemoglobin: 12 g/dL (ref 12.0–15.0)
MCH: 28.5 pg (ref 26.0–34.0)
MCHC: 33.1 g/dL (ref 30.0–36.0)
MCV: 86 fL (ref 80.0–100.0)
NRBC: 0 % (ref 0.0–0.2)
PLATELETS: 239 10*3/uL (ref 150–400)
RBC: 4.21 MIL/uL (ref 3.87–5.11)
RDW: 13.6 % (ref 11.5–15.5)
WBC: 9.1 10*3/uL (ref 4.0–10.5)

## 2018-10-12 LAB — AMNISURE RUPTURE OF MEMBRANE (ROM) NOT AT ARMC: Amnisure ROM: POSITIVE

## 2018-10-12 MED ORDER — BETAMETHASONE SOD PHOS & ACET 6 (3-3) MG/ML IJ SUSP
12.0000 mg | Freq: Once | INTRAMUSCULAR | Status: AC
  Administered 2018-10-12: 12 mg via INTRAMUSCULAR
  Filled 2018-10-12: qty 2

## 2018-10-12 MED ORDER — SIMETHICONE 80 MG PO CHEW: 80.0000 mg | CHEWABLE_TABLET | ORAL | Status: DC | PRN

## 2018-10-12 MED ORDER — OXYTOCIN 40 UNITS IN LACTATED RINGERS INFUSION - SIMPLE MED: 2.5000 [IU]/h | INTRAVENOUS | Status: DC

## 2018-10-12 MED ORDER — DIPHENHYDRAMINE HCL 25 MG PO CAPS: 25.0000 mg | ORAL_CAPSULE | Freq: Four times a day (QID) | ORAL | Status: DC | PRN

## 2018-10-12 MED ORDER — PRENATAL MULTIVITAMIN CH: 1.0000 | ORAL_TABLET | Freq: Every day | ORAL | Status: DC

## 2018-10-12 MED ORDER — TERBUTALINE SULFATE 1 MG/ML IJ SOLN
0.2500 mg | Freq: Once | INTRAMUSCULAR | Status: DC | PRN
  Filled 2018-10-12: qty 1

## 2018-10-12 MED ORDER — OXYCODONE-ACETAMINOPHEN 5-325 MG PO TABS: 1.0000 | ORAL_TABLET | ORAL | Status: DC | PRN

## 2018-10-12 MED ORDER — PENICILLIN G 3 MILLION UNITS IVPB - SIMPLE MED
3.0000 10*6.[IU] | INTRAVENOUS | Status: DC
  Administered 2018-10-12 (×2): 3 10*6.[IU] via INTRAVENOUS
  Filled 2018-10-12 (×2): qty 100

## 2018-10-12 MED ORDER — OXYTOCIN 40 UNITS IN LACTATED RINGERS INFUSION - SIMPLE MED
1.0000 m[IU]/min | INTRAVENOUS | Status: DC
  Administered 2018-10-12: 2 m[IU]/min via INTRAVENOUS
  Filled 2018-10-12: qty 1000

## 2018-10-12 MED ORDER — ZOLPIDEM TARTRATE 5 MG PO TABS: 5.0000 mg | ORAL_TABLET | Freq: Every evening | ORAL | Status: DC | PRN

## 2018-10-12 MED ORDER — LACTATED RINGERS IV SOLN
INTRAVENOUS | Status: DC
  Administered 2018-10-12: 11:00:00 via INTRAVENOUS

## 2018-10-12 MED ORDER — ONDANSETRON HCL 4 MG/2ML IJ SOLN: 4.0000 mg | INTRAMUSCULAR | Status: DC | PRN

## 2018-10-12 MED ORDER — LACTATED RINGERS IV SOLN: 500.0000 mL | INTRAVENOUS | Status: DC | PRN

## 2018-10-12 MED ORDER — DIBUCAINE 1 % RE OINT: 1.0000 "application " | TOPICAL_OINTMENT | RECTAL | Status: DC | PRN

## 2018-10-12 MED ORDER — OXYCODONE-ACETAMINOPHEN 5-325 MG PO TABS: 2.0000 | ORAL_TABLET | ORAL | Status: DC | PRN

## 2018-10-12 MED ORDER — SENNOSIDES-DOCUSATE SODIUM 8.6-50 MG PO TABS
2.0000 | ORAL_TABLET | ORAL | Status: DC
  Administered 2018-10-12: 2 via ORAL
  Filled 2018-10-12: qty 2

## 2018-10-12 MED ORDER — BENZOCAINE-MENTHOL 20-0.5 % EX AERO: 1.0000 "application " | INHALATION_SPRAY | CUTANEOUS | Status: DC | PRN

## 2018-10-12 MED ORDER — FENTANYL CITRATE (PF) 100 MCG/2ML IJ SOLN
100.0000 ug | INTRAMUSCULAR | Status: DC | PRN
  Administered 2018-10-12 (×2): 100 ug via INTRAVENOUS
  Filled 2018-10-12 (×2): qty 2

## 2018-10-12 MED ORDER — MEASLES, MUMPS & RUBELLA VAC IJ SOLR
0.5000 mL | Freq: Once | INTRAMUSCULAR | Status: DC
  Filled 2018-10-12: qty 0.5

## 2018-10-12 MED ORDER — LIDOCAINE HCL (PF) 1 % IJ SOLN
30.0000 mL | INTRAMUSCULAR | Status: DC | PRN
  Filled 2018-10-12: qty 30

## 2018-10-12 MED ORDER — ACETAMINOPHEN 325 MG PO TABS: 650.0000 mg | ORAL_TABLET | ORAL | Status: DC | PRN

## 2018-10-12 MED ORDER — ONDANSETRON HCL 4 MG/2ML IJ SOLN: 4.0000 mg | Freq: Four times a day (QID) | INTRAMUSCULAR | Status: DC | PRN

## 2018-10-12 MED ORDER — SODIUM CHLORIDE 0.9 % IV SOLN
5.0000 10*6.[IU] | Freq: Once | INTRAVENOUS | Status: AC
  Administered 2018-10-12: 5 10*6.[IU] via INTRAVENOUS
  Filled 2018-10-12: qty 5

## 2018-10-12 MED ORDER — ACETAMINOPHEN 325 MG PO TABS
650.0000 mg | ORAL_TABLET | ORAL | Status: DC | PRN
  Administered 2018-10-12: 650 mg via ORAL
  Filled 2018-10-12: qty 2

## 2018-10-12 MED ORDER — ONDANSETRON HCL 4 MG PO TABS: 4.0000 mg | ORAL_TABLET | ORAL | Status: DC | PRN

## 2018-10-12 MED ORDER — SOD CITRATE-CITRIC ACID 500-334 MG/5ML PO SOLN: 30.0000 mL | ORAL | Status: DC | PRN

## 2018-10-12 MED ORDER — TETANUS-DIPHTH-ACELL PERTUSSIS 5-2.5-18.5 LF-MCG/0.5 IM SUSP: 0.5000 mL | Freq: Once | INTRAMUSCULAR | Status: DC

## 2018-10-12 MED ORDER — WITCH HAZEL-GLYCERIN EX PADS: 1.0000 "application " | MEDICATED_PAD | CUTANEOUS | Status: DC | PRN

## 2018-10-12 MED ORDER — IBUPROFEN 600 MG PO TABS
600.0000 mg | ORAL_TABLET | Freq: Four times a day (QID) | ORAL | Status: DC
  Administered 2018-10-12 – 2018-10-13 (×2): 600 mg via ORAL
  Filled 2018-10-12 (×2): qty 1

## 2018-10-12 MED ORDER — COCONUT OIL OIL: 1.0000 "application " | TOPICAL_OIL | Status: DC | PRN

## 2018-10-12 MED ORDER — OXYTOCIN BOLUS FROM INFUSION
500.0000 mL | Freq: Once | INTRAVENOUS | Status: AC
  Administered 2018-10-12: 500 mL via INTRAVENOUS

## 2018-10-12 NOTE — MAU Note (Signed)
Pt states she had a trickle of fluid around 0600 this morning.  Ctx began around 0400 this morning.  Pt states they are about 10 min apart.  Pt reports some bloody show.  Reports good fm.

## 2018-10-12 NOTE — Consult Note (Signed)
Neonatology Consult to Antenatal Patient:  I was asked by Dr. Earlene Plateravis to see this patient in order to provide antenatal counseling due to known gastroschisis.  Ms. Monica [name] was admitted 10/12/18  at 36 4/[redacted] weeks GA. She is currently having active labor with ROM. She is getting IV PCN and BTMZ.  Bowel is dilated; no other known complications. She has refused amnio.    I spoke with the patient. We discussed expectant management in the next day or so, including usual DR management of exposed bowel and subsequent NICU care, including info related to patient's prematurity, such as possible respiratory complications and need for support, IV access, bowel decompression, delayed feedings until a period after surgery, LOS, Mortality and Morbidity, and long term outcomes. She did have any questions at this time which I answered. I would be glad to come back if she has more questions later.  Peds Surgeon, Dr. Gus PumaAdibe, has been notified of patient's current status in LDR.  Thank you for asking me to see this patient.  Monica Kidavid C. Leary RocaEhrmann, MD Neonatologist 10/12/2018, 2:00 PM  The total length of face-to-face or floor/unit time for this encounter was 25 minutes. Counseling and/or coordination of care was 40 minutes of the above.

## 2018-10-12 NOTE — Progress Notes (Signed)
OB/GYN Faculty Practice: Labor Progress Note  Subjective: Doing well, somewhat uncomfortable with contractions.   Objective: BP 129/85   Pulse (!) 112   Temp 98.5 F (36.9 C) (Oral)   Resp 18   Ht 5\' 3"  (1.6 m)   Wt 66.7 kg   LMP 01/29/2018   SpO2 97%   BMI 26.04 kg/m  Gen: well-appearing, NAD Dilation: 2 Effacement (%): 70 Cervical Position: Middle Station: -2 Presentation: Vertex Exam by:: Thressa ShellerHeather Hogan CNM  Assessment and Plan: 22 y.o. G2P1001 563w4d here with SROM, SOL.   Labor: Planned induction tomorrow for gastroschisis. Presented today with contractions, SROM. No changed in last 5-6 hours. Will start augmentation with pitocin given too many contractions for cytotec at this time.  -- pain control: would prefer IV only  -- PPH Risk: low  Fetal Well-Being: Known fetal gastroschisis - bowel only noted on U/S - and also with signs of growth restriction - EFW 1680g (<10%), AC<3% at 34w2. Elevated UAD >97.5%. Cephalic by sutures.  -- Category I - continuous fetal monitoring  -- GBS positive (pcn)  -- NICU c/s given known gastroschisis (will be present at delivery), peds surgery aware  Kewon Statler S. Earlene PlaterWallace, DO OB/GYN Fellow, Faculty Practice  3:37 PM

## 2018-10-12 NOTE — Progress Notes (Signed)
OB/[facility]N Faculty Practice: Labor Progress Note  Subjective: Comfortable between contractions. Reporting increasing pressure.  Objective: BP 90/72   Pulse (!) 113   Temp 98.2 F (36.8 C) (Oral)   Resp 16   Ht 5\' 3"  (1.6 m)   Wt 66.7 kg   LMP 01/29/2018   SpO2 97%   BMI 26.04 kg/m  Gen: well-appearing, NAD Dilation: 7 Effacement (%): 90 Cervical Position: Middle Station: -1 Presentation: Vertex Exam by:: Daivs, RN  Assessment and Plan: 22 y.o. G2P1001 5078w4d here with SROM, SOL. Forebag present on repeat exam.  Labor: Planned induction tomorrow for gastroschisis. Presented today with contractions, SROM. No changed in last 5-6 hours.  -AROM of forebag 1910 with thin mec -- pain control: would prefer IV only  -- PPH Risk: low  Fetal Well-Being: Known fetal gastroschisis - bowel only noted on U/S - and also with signs of growth restriction - EFW 1680g (<10%), AC<3% at 34w2. Elevated UAD >97.5%. Cephalic by sutures.  -- Category I - continuous fetal monitoring  -- GBS positive (pcn)  -- NICU c/s given known gastroschisis (will be present at delivery), peds surgery aware  Wendie AgresteSarah Asman Lynnox Girten, MD PGY-1 Family Medicine Resident  7:12 PM

## 2018-10-12 NOTE — H&P (Addendum)
LABOR AND DELIVERY ADMISSION HISTORY AND PHYSICAL NOTE  Monica [name] is a 22 y.o. female G2P1001 with IUP at 3029w4d by L/18 presenting for SROM with contractions.   She reports positive fetal movement. She reports leakage of fluid this morning as well as some bloody show and passage of a mucous plug.   Prenatal History/Complications[facility] Pregnancy complications:  - fetal gastroschisis   Past Medical History: Past Medical History:  Diagnosis Date  . Medical history non-contributory     Past Surgical History: Past Surgical History:  Procedure Laterality Date  . APPENDECTOMY      Obstetrical History: OB History    Gravida  2   Para  1   Term  1   Preterm  0   AB  0   Living  1     SAB  0   TAB  0   Ectopic  0   Multiple  0   Live Births  1           Social History: Social History   Socioeconomic History  . Marital status: Single    Spouse name: Not on file  . Number of children: Not on file  . Years of education: Not on file  . Highest education level: Not on file  Occupational History  . Not on file  Social Needs  . Financial resource strain: Not on file  . Food insecurity:    Worry: Not on file    Inability: Not on file  . Transportation needs:    Medical: Not on file    Non-medical: Not on file  Tobacco Use  . Smoking status: Former Smoker    Last attempt to quit: 01/07/2016    Years since quitting: 2.7  . Smokeless tobacco: Never Used  Substance and Sexual Activity  . Alcohol use: No  . Drug use: No  . Sexual activity: Yes    Birth control/protection: None  Lifestyle  . Physical activity:    Days per week: Not on file    Minutes per session: Not on file  . Stress: Not on file  Relationships  . Social connections:    Talks on phone: Not on file    Gets together: Not on file    Attends religious service: Not on file    Active member of club or organization: Not on file    Attends  meetings of clubs or organizations: Not on file    Relationship status: Not on file  Other Topics Concern  . Not on file  Social History Narrative   11th grade    Family History: Family History  Problem Relation Age of Onset  . Diabetes Maternal Grandfather     Allergies: No Known Allergies  Medications Prior to Admission  Medication Sig Dispense Refill Last Dose  . acetaminophen (TYLENOL) 325 MG tablet Take 650 mg by mouth every 6 (six) hours as needed for mild pain.   Taking  . cephALEXin (KEFLEX) 500 MG capsule Take 1 capsule (500 mg total) by mouth 4 (four) times daily. 28 capsule 0 Taking  . Prenatal Vit-Fe Fumarate-FA (PRENATAL COMPLETE) 14-0.4 MG TABS Take one tab daily. (Patient taking differently: Take 1 tablet by mouth daily. ) 60 each 0 Taking     Review of Systems  All systems reviewed and negative except as stated in HPI  Physical Exam Blood pressure 112/71, pulse 84, temperature 97.9 F (36.6 C), temperature source Oral, resp. rate 20,  height 5\' 3"  (1.6 m), weight 66.7 kg, last menstrual period 01/29/2018, SpO2 97 %, currently breastfeeding. General appearance: alert, oriented, NAD Lungs: normal respiratory effort Heart: regular rate Abdomen: soft, non-tender; gravid, FH appropriate for GA Extremities: No calf swelling or tenderness Presentation: cephalic Fetal monitoring: Baseline rate 150s with moderate variability. Accelerations present. No decelerations.  Uterine activity: Moderate irregular ctx.  Dilation: 2 Effacement (%): 70 Station: -2 Exam by:: Thressa Sheller CNM  Prenatal labs: ABO, Rh: --/--/B POS (11/04 0525) Antibody: NEG (11/04 0525) Rubella: 1.81 (06/19 1616) RPR: Non Reactive (10/03 0844)  HBsAg: Negative (06/19 1616)  HIV: Non Reactive (10/03 0844)  GC/Chlamydia: Negative (09/11/18) GBS:  Positive  2-hr GTT: Negative Genetic screening:  Panorama low risk. MSAFP Screening showed increased risk for open neural tube defects (AFP  6.72) Anatomy US: Fetal gastroschisis; only bowel extrusion   Prenatal Transfer Tool  Maternal Diabetes: No Genetic Screening: Abnormal:  Results: Elevated AFP Maternal Ultrasounds/Referrals: Abnormal:  Findings:   Other: Fetal gastroschisis Fetal Ultrasounds or other Referrals:  None Maternal Substance Abuse:  No Significant Maternal Medications:  None Significant Maternal Lab Results: Lab values include: Group B Strep positive  Results for orders placed or performed during the hospital encounter of 10/12/18 (from the past 24 hour(s))  Urinalysis, Routine w reflex microscopic   Collection Time: 10/12/18  9:34 AM  Result Value Ref Range   Color, Urine YELLOW YELLOW   APPearance HAZY (A) CLEAR   Specific Gravity, Urine 1.015 1.005 - 1.030   pH 7.0 5.0 - 8.0   Glucose, UA NEGATIVE NEGATIVE mg/dL   Hgb urine dipstick SMALL (A) NEGATIVE   Bilirubin Urine NEGATIVE NEGATIVE   Ketones, ur NEGATIVE NEGATIVE mg/dL   Protein, ur NEGATIVE NEGATIVE mg/dL   Nitrite NEGATIVE NEGATIVE   Leukocytes, UA NEGATIVE NEGATIVE   RBC / HPF 0-5 0 - 5 RBC/hpf   WBC, UA 0-5 0 - 5 WBC/hpf   Bacteria, UA RARE (A) NONE SEEN   Squamous Epithelial / LPF 0-5 0 - 5   Mucus PRESENT   Amnisure rupture of membrane (rom)not at [facility]   Collection Time: 10/12/18 10:02 AM  Result Value Ref Range   Amnisure ROM POSITIVE     Patient Active Problem List   Diagnosis Date Noted  . Urinary tract infection during pregnancy in third trimester, antepartum 09/14/2018  . Preterm uterine contractions in third trimester, antepartum 09/12/2018  . Fetal gastroschisis during pregnancy, antepartum 06/09/2018  . Supervision of high-risk pregnancy 04/27/2018  . Social problem 11/30/2016    Assessment: Monica [name] is a 22 y.o. G2P1001 at [redacted]w[redacted]d here for preterm SROM and preterm labor. Initially had planned induction at 37 weeks for gastroschisis.   #Labor: progressing without augmentation, consider pitocin if no  change  #Pain: Pt plans for natural birth with IV pain medicine as needed #FWB: Vertex by sutures. Reactive strip. IUGR (EFW<10% with elevated dopplers) and gastroschisis noted on U/S (only bowel noted). NICU aware, will plan to be present at delivery. Last BMZ about a month ago, will give one additional dose.  #ID:  GBS+ receiving PenG  #MOF: Breast and bottle #MOC: Nexplanon #Circ:  No  Erin Elphick, Student-PA.  10/12/2018, 10:58 AM   Attestation: I have seen this patient and agree with the physician assistant student's documentation. I have examined them separately, and we have discussed the plan of care.  Cristal Deer. Earlene Plater, DO OB/GYN Fellow

## 2018-10-12 NOTE — MAU Provider Note (Signed)
First Provider Initiated Contact with Patient 10/12/18 [phone]       S: Ms. Stann OreFrida Hernandez [name] is a 22 y.o. G2P1001 at 5865w4d  who presents to MAU today complaining of leaking of fluid since 0600. She endorses vaginal bleeding. She endorses contractions. She reports normal fetal movement.    O: BP 112/71 (BP Location: Right Arm)   Pulse 84   Temp 97.9 F (36.6 C) (Oral)   Resp 20   Ht 5\' 3"  (1.6 m)   Wt 66.7 kg   LMP 01/29/2018   SpO2 97%   BMI 26.04 kg/m  GENERAL: Well-developed, well-nourished female in no acute distress.  HEAD: Normocephalic, atraumatic.  CHEST: Normal effort of breathing, regular heart rate ABDOMEN: Soft, nontender, gravid PELVIC: Normal external female genitalia. Vagina is pink and rugated. Cervix with normal contour, no lesions. Normal discharge.  + pooling.   Cervical exam:  Dilation: 2 Effacement (%): 70 Cervical Position: Middle Station: -2 Presentation: Vertex Exam by:: Thressa ShellerHeather Evan Mackie CNM   Fetal Monitoring: Baseline: 155 Variability: moderate Accelerations: 15x15 Decelerations: none Contractions: about every 2 mins   Results for orders placed or performed during the hospital encounter of 10/12/18 (from the past 24 hour(s))  Urinalysis, Routine w reflex microscopic     Status: Abnormal   Collection Time: 10/12/18  9:34 AM  Result Value Ref Range   Color, Urine YELLOW YELLOW   APPearance HAZY (A) CLEAR   Specific Gravity, Urine 1.015 1.005 - 1.030   pH 7.0 5.0 - 8.0   Glucose, UA NEGATIVE NEGATIVE mg/dL   Hgb urine dipstick SMALL (A) NEGATIVE   Bilirubin Urine NEGATIVE NEGATIVE   Ketones, ur NEGATIVE NEGATIVE mg/dL   Protein, ur NEGATIVE NEGATIVE mg/dL   Nitrite NEGATIVE NEGATIVE   Leukocytes, UA NEGATIVE NEGATIVE   RBC / HPF 0-5 0 - 5 RBC/hpf   WBC, UA 0-5 0 - 5 WBC/hpf   Bacteria, UA RARE (A) NONE SEEN   Squamous Epithelial / LPF 0-5 0 - 5   Mucus PRESENT   Am[facility]     Status: None   Collection Time: 10/12/18 10:02 AM  Result Value Ref Range   Amnisure ROM POSITIVE      A: SIUP at 7965w4d  PPROM    P: Admit to labor and delivery  PCN for GBS BMZ since one month since last BMZ NICU consult   Armando ReichertHogan, Leigha Olberding D, CNM 10/12/2018 10:44 AM

## 2018-10-13 LAB — RPR: RPR Ser Ql: NONREACTIVE

## 2018-10-13 MED ORDER — BREAST MILK
ORAL | Status: DC
  Filled 2018-10-13: qty 1

## 2018-10-13 MED ORDER — IBUPROFEN 600 MG PO TABS: 600.0000 mg | ORAL_TABLET | Freq: Four times a day (QID) | ORAL | 0 refills | Status: DC

## 2018-10-13 NOTE — Discharge Instructions (Signed)
Vaginal Delivery, Care After °Refer to this sheet in the next few weeks. These instructions provide you with information about caring for yourself after vaginal delivery. Your health care provider may also give you more specific instructions. Your treatment has been planned according to current medical practices, but problems sometimes occur. Call your health care provider if you have any problems or questions. °What can I expect after the procedure? °After vaginal delivery, it is common to have: °· Some bleeding from your vagina. °· Soreness in your abdomen, your vagina, and the area of skin between your vaginal opening and your anus (perineum). °· Pelvic cramps. °· Fatigue. ° °Follow these instructions at home: °Medicines °· Take over-the-counter and prescription medicines only as told by your health care provider. °· If you were prescribed an antibiotic medicine, take it as told by your health care provider. Do not stop taking the antibiotic until it is finished. °Driving ° °· Do not drive or operate heavy machinery while taking prescription pain medicine. °· Do not drive for 24 hours if you received a sedative. °Lifestyle °· Do not drink alcohol. This is especially important if you are breastfeeding or taking medicine to relieve pain. °· Do not use tobacco products, including cigarettes, chewing tobacco, or e-cigarettes. If you need help quitting, ask your health care provider. °Eating and drinking °· Drink at least 8 eight-ounce glasses of water every day unless you are told not to by your health care provider. If you choose to breastfeed your baby, you may need to drink more water than this. °· Eat high-fiber foods every day. These foods may help prevent or relieve constipation. High-fiber foods include: °? Whole grain cereals and breads. °? Brown rice. °? Beans. °? Fresh fruits and vegetables. °Activity °· Return to your normal activities as told by your health care provider. Ask your health care provider  what activities are safe for you. °· Rest as much as possible. Try to rest or take a nap when your baby is sleeping. °· Do not lift anything that is heavier than your baby or 10 lb (4.5 kg) until your health care provider says that it is safe. °· Talk with your health care provider about when you can engage in sexual activity. This may depend on your: °? Risk of infection. °? Rate of healing. °? Comfort and desire to engage in sexual activity. °Vaginal Care °· If you have an episiotomy or a vaginal tear, check the area every day for signs of infection. Check for: °? More redness, swelling, or pain. °? More fluid or blood. °? Warmth. °? Pus or a bad smell. °· Do not use tampons or douches until your health care provider says this is safe. °· Watch for any blood clots that may pass from your vagina. These may look like clumps of dark red, brown, or black discharge. °General instructions °· Keep your perineum clean and dry as told by your health care provider. °· Wear loose, comfortable clothing. °· Wipe from front to back when you use the toilet. °· Ask your health care provider if you can shower or take a bath. If you had an episiotomy or a perineal tear during labor and delivery, your health care provider may tell you not to take baths for a certain length of time. °· Wear a bra that supports your breasts and fits you well. °· If possible, have someone help you with household activities and help care for your baby for at least a few days after   you leave the hospital. °· Keep all follow-up visits for you and your baby as told by your health care provider. This is important. °Contact a health care provider if: °· You have: °? Vaginal discharge that has a bad smell. °? Difficulty urinating. °? Pain when urinating. °? A sudden increase or decrease in the frequency of your bowel movements. °? More redness, swelling, or pain around your episiotomy or vaginal tear. °? More fluid or blood coming from your episiotomy or  vaginal tear. °? Pus or a bad smell coming from your episiotomy or vaginal tear. °? A fever. °? A rash. °? Little or no interest in activities you used to enjoy. °? Questions about caring for yourself or your baby. °· Your episiotomy or vaginal tear feels warm to the touch. °· Your episiotomy or vaginal tear is separating or does not appear to be healing. °· Your breasts are painful, hard, or turn red. °· You feel unusually sad or worried. °· You feel nauseous or you vomit. °· You pass large blood clots from your vagina. If you pass a blood clot from your vagina, save it to show to your health care provider. Do not flush blood clots down the toilet without having your health care provider look at them. °· You urinate more than usual. °· You are dizzy or light-headed. °· You have not breastfed at all and you have not had a menstrual period for 12 weeks after delivery. °· You have stopped breastfeeding and you have not had a menstrual period for 12 weeks after you stopped breastfeeding. °Get help right away if: °· You have: °? Pain that does not go away or does not get better with medicine. °? Chest pain. °? Difficulty breathing. °? Blurred vision or spots in your vision. °? Thoughts about hurting yourself or your baby. °· You develop pain in your abdomen or in one of your legs. °· You develop a severe headache. °· You faint. °· You bleed from your vagina so much that you fill two sanitary pads in one hour. °This information is not intended to replace advice given to you by your health care provider. Make sure you discuss any questions you have with your health care provider. °Document Released: 10/23/2000 Document Revised: 04/08/2016 Document Reviewed: 11/10/2015 °Elsevier Interactive Patient Education © 2018 [employer] Inc[employer] ° °

## 2018-10-13 NOTE — Discharge Summary (Signed)
Postpartum Discharge Summary     Patient Name: Monica [name] DOB: [birth year] MRN: [id number]  Date of admission: 10/12/2018 Delivering Provider: Tamera Stands   Date of discharge: 10/13/2018  Admitting diagnosis: 36wks, cramping, CTX, bleeding Intrauterine pregnancy: [redacted]w[redacted]d     Secondary diagnosis:  Active Problems:   PROM (premature rupture of membranes)   Vaginal delivery  Additional problems: Fetal Gastroschisis     Discharge diagnosis: Preterm Pregnancy Delivered                                                                                                Post partum procedures:none, but had Betamethasone prior to delivery  Augmentation: none  Complications: None except baby with [facility] course:  Onset of Labor With Vaginal Delivery     22 y.o. yo [id number] at [redacted]w[redacted]d was admitted in Latent Labor on 10/12/2018. Patient had an uncomplicated labor course as follows:  Membrane Rupture Time/Date: 4:00 AM ,10/12/2018   Intrapartum Procedures: Episiotomy: None [1]                                         Lacerations:  None [1]  Patient had a delivery of a Viable infant. 10/12/2018  Information for the patient's newborn:  Katheryn, [place] [[id number]]  Delivery Method: Vaginal, Spontaneous(Filed from Delivery Summary)  Baby was taken to NICU and then transferred for surgical management at [facility]  Pateint had an uncomplicated postpartum course.  She is ambulating, tolerating a regular diet, passing flatus, and urinating well. Patient is discharged home in stable condition on 10/13/18.   Magnesium Sulfate recieved: No BMZ received: Yes  Physical exam  Vitals:   10/12/18 2116 10/12/18 2240 10/12/18 2353 10/13/18 0342  BP: 122/62 123/73 121/69 (!) 109/59  Pulse: 78 87 81 66  Resp: 16 18 18 18   Temp: 98.2 F (36.8 C) 98.2 F (36.8 C) (!) 97.5 F (36.4 C) 97.7 F (36.5 C)  TempSrc: Oral Oral Oral Oral  SpO2:  99% 100% 100%  Weight:       Height:       General: alert, cooperative and no distress Lochia: appropriate Uterine Fundus: firm Incision: N/A DVT Evaluation: No evidence of DVT seen on physical exam. Labs: Lab Results  Component Value Date   WBC 9.1 10/12/2018   HGB 12.0 10/12/2018   HCT 36.2 10/12/2018   MCV 86.0 10/12/2018   PLT 239 10/12/2018   CMP Latest Ref Rng & Units 02/25/2018  Glucose 65 - 99 mg/dL 94  BUN 6 - 20 mg/dL 8  Creatinine 1.30 - 8.65 mg/dL 7.84  Sodium 696 - 295 mmol/L 137  Potassium 3.5 - 5.1 mmol/L 3.9  Chloride 101 - 111 mmol/L 104  CO2 22 - 32 mmol/L 25  Calcium 8.9 - 10.3 mg/dL 9.3  Total Protein 6.5 - 8.1 g/dL 7.6  Total Bilirubin 0.3 - 1.2 mg/dL 0.7  Alkaline Phos 38 - 126 U/L 70  AST 15 - 41 U/L 21  ALT  14 - 54 U/L 20    Discharge instruction: per After Visit Summary and "Baby and Me Booklet".  After visit meds:  Allergies as of 10/13/2018   No Known Allergies     Medication List    TAKE these medications   cephALEXin 500 MG capsule Commonly known as:  KEFLEX Take 1 capsule (500 mg total) by mouth 4 (four) times daily.   ibuprofen 600 MG tablet Commonly known as:  ADVIL,MOTRIN Take 1 tablet (600 mg total) by mouth every 6 (six) hours.   PRENATAL COMPLETE 14-0.4 MG Tabs Take one tab daily. What changed:    how much to take  how to take this  when to take this  additional instructions       Diet: routine diet  Activity: Advance as tolerated. Pelvic rest for 6 weeks.   Outpatient follow up:4 weeks   Agrees to come to clinic to see Marijean NiemannJaime this week Follow up Appt:No future appointments. Follow up Visit: Follow-up Information    [facility] Schedule an appointment as soon as possible for a visit in 2 day(s).   Specialty:  Obstetrics and Gynecology Why:  To See Marijean NiemannJaime the Counselor in Clinic, then checkup in 4 weeks Contact information: [address] [place] [id number] [phone]            Please schedule this patient for Postpartum visit in: 4 weeks with the following provider: Any provider For C/S patients schedule nurse incision check in weeks 2 weeks: no High risk pregnancy complicated by: none except baby with Gastroschisis Delivery mode:  SVD Anticipated Birth Control:  Nexplanon PP Procedures needed: none  Schedule Integrated BH visit: yes      Newborn Data: Live born female  Birth Weight: 5 lb 10.3 oz (2560 g) APGAR: 8, 9  Newborn Delivery   Birth date/time:  10/12/2018 19:55:00 Delivery type:  Vaginal, Spontaneous     Baby Feeding: Breast Disposition:[place] after NICU   10/13/2018 Wynelle BourgeoisMarie Mehak Roskelley, CNM

## 2018-11-17 ENCOUNTER — Ambulatory Visit (INDEPENDENT_AMBULATORY_CARE_PROVIDER_SITE_OTHER): Payer: Self-pay | Admitting: Student

## 2018-11-17 ENCOUNTER — Encounter: Payer: Self-pay | Admitting: Student

## 2018-11-17 DIAGNOSIS — Z30011 Encounter for initial prescription of contraceptive pills: Secondary | ICD-10-CM

## 2018-11-17 LAB — POCT PREGNANCY, URINE: Preg Test, Ur: NEGATIVE

## 2018-11-17 MED ORDER — NORGESTIMATE-ETH ESTRADIOL 0.25-35 MG-MCG PO TABS: 1.0000 | ORAL_TABLET | Freq: Every day | ORAL | 11 refills | Status: DC

## 2018-11-17 NOTE — Progress Notes (Signed)
Subjective:     Monica [name] is a 23 y.o. female who presents for a postpartum visit. She is 5 weeks postpartum following a spontaneous vaginal delivery. I have fully reviewed the prenatal and intrapartum course. The delivery was at 36 gestational weeks. Outcome: spontaneous vaginal delivery. Anesthesia: none. Postpartum course has been normal. Baby's course has been complicated by gastroschisis; baby is currently at [facility] hospital. Pecola Leisure is feeding by Brenner's care due to gastroschisis.. Bleeding no bleeding. Bowel function is abnormal: constipation. Reports that she has always had issues with constipation even prior to pregnancy. Bladder function is normal. Patient is not sexually active. Contraception method is OCP (estrogen/progesterone). Postpartum depression screening: negative.  The following portions of the patient's history were reviewed and updated as appropriate: allergies, current medications, past family history, past social history, past surgical history and problem list.  Review of Systems Pertinent items are noted in HPI.   Objective:    BP 102/62   Pulse (!) 57   Wt 129 lb 4.8 oz (58.7 kg)   LMP 01/29/2018   BMI 22.90 kg/m   General:  alert, cooperative and appears stated age  Lungs: clear to auscultation bilaterally  Heart:  regular rate and rhythm, S1, S2 normal, no murmur, click, rub or gallop  Abdomen: soft, non-tender; bowel sounds normal; no masses,  no organomegaly   Vulva:  not evaluated  Vagina: not evaluated        Assessment:     Normal postpartum exam. Pap smear not done at today's visit.   Plan:   1. Encounter for routine postpartum follow-up -pap smear up to date -offered [facility] services d/t stress surrounding hospitalized infant -- pt declines  2. Encounter for initial prescription of contraceptive pills -UPT negative - norgestimate-ethinyl estradiol (ORTHO-CYCLEN,SPRINTEC,PREVIFEM) 0.25-35 MG-MCG tablet; Take 1 tablet by mouth  daily.  Dispense: 1 Package; Refill: 11   [name], NP

## 2018-11-17 NOTE — Patient Instructions (Addendum)
[facility] [phone] [facility]    Health Maintenance, Female Adopting a healthy lifestyle and getting preventive care can go a long way to promote health and wellness. Talk with your health care provider about what schedule of regular examinations is right for you. This is a good chance for you to check in with your provider about disease prevention and staying healthy. In between checkups, there are plenty of things you can do on your own. Experts have done a lot of research about which lifestyle changes and preventive measures are most likely to keep you healthy. Ask your health care provider for more information. Weight and diet Eat a healthy diet  Be sure to include plenty of vegetables, fruits, low-fat dairy products, and lean protein.  Do not eat a lot of foods high in solid fats, added sugars, or salt.  Get regular exercise. This is one of the most important things you can do for your health. ? Most adults should exercise for at least 150 minutes each week. The exercise should increase your heart rate and make you sweat (moderate-intensity exercise). ? Most adults should also do strengthening exercises at least twice a week. This is in addition to the moderate-intensity exercise. Maintain a healthy weight  Body mass index (BMI) is a measurement that can be used to identify possible weight problems. It estimates body fat based on height and weight. Your health care provider can help determine your BMI and help you achieve or maintain a healthy weight.  For females 23 years of age and older: ? A BMI below 18.5 is considered underweight. ? A BMI of 18.5 to 24.9 is normal. ? A BMI of 25 to 29.9 is considered overweight. ? A BMI of 30 and above is considered obese. Watch levels of cholesterol and blood lipids  You should start having your blood tested for lipids and cholesterol at 23 years of age, then have this test every 5  years.  You may need to have your cholesterol levels checked more often if: ? Your lipid or cholesterol levels are high. ? You are older than 23 years of age. ? You are at high risk for heart disease. Cancer screening Lung Cancer  Lung cancer screening is recommended for adults 52-16 years old who are at high risk for lung cancer because of a history of smoking.  A yearly low-dose CT scan of the lungs is recommended for people who: ? Currently smoke. ? Have quit within the past 15 years. ? Have at least a 30-pack-year history of smoking. A pack year is smoking an average of one pack of cigarettes a day for 1 year.  Yearly screening should continue until it has been 15 years since you quit.  Yearly screening should stop if you develop a health problem that would prevent you from having lung cancer treatment. Breast Cancer  Practice breast self-awareness. This means understanding how your breasts normally appear and feel.  It also means doing regular breast self-exams. Let your health care provider know about any changes, no matter how small.  If you are in your 20s or 30s, you should have a clinical breast exam (CBE) by a health care provider every 1-3 years as part of a regular health exam.  If you are 65 or older, have a CBE every year. Also consider having a breast X-ray (mammogram) every year.  If you have a family history of breast cancer, talk to your health care provider  about genetic screening.  If you are at high risk for breast cancer, talk to your health care provider about having an MRI and a mammogram every year.  Breast cancer gene (BRCA) assessment is recommended for women who have family members with BRCA-related cancers. BRCA-related cancers include: ? Breast. ? Ovarian. ? Tubal. ? Peritoneal cancers.  Results of the assessment will determine the need for genetic counseling and BRCA1 and BRCA2 testing. Cervical Cancer Your health care provider may recommend  that you be screened regularly for cancer of the pelvic organs (ovaries, uterus, and vagina). This screening involves a pelvic examination, including checking for microscopic changes to the surface of your cervix (Pap test). You may be encouraged to have this screening done every 3 years, beginning at age 66.  For women ages 56-65, health care providers may recommend pelvic exams and Pap testing every 3 years, or they may recommend the Pap and pelvic exam, combined with testing for human papilloma virus (HPV), every 5 years. Some types of HPV increase your risk of cervical cancer. Testing for HPV may also be done on women of any age with unclear Pap test results.  Other health care providers may not recommend any screening for nonpregnant women who are considered low risk for pelvic cancer and who do not have symptoms. Ask your health care provider if a screening pelvic exam is right for you.  If you have had past treatment for cervical cancer or a condition that could lead to cancer, you need Pap tests and screening for cancer for at least 20 years after your treatment. If Pap tests have been discontinued, your risk factors (such as having a new sexual partner) need to be reassessed to determine if screening should resume. Some women have medical problems that increase the chance of getting cervical cancer. In these cases, your health care provider may recommend more frequent screening and Pap tests. Colorectal Cancer  This type of cancer can be detected and often prevented.  Routine colorectal cancer screening usually begins at 23 years of age and continues through 23 years of age.  Your health care provider may recommend screening at an earlier age if you have risk factors for colon cancer.  Your health care provider may also recommend using home test kits to check for hidden blood in the stool.  A small camera at the end of a tube can be used to examine your colon directly (sigmoidoscopy or  colonoscopy). This is done to check for the earliest forms of colorectal cancer.  Routine screening usually begins at age 61.  Direct examination of the colon should be repeated every 5-10 years through 23 years of age. However, you may need to be screened more often if early forms of precancerous polyps or small growths are found. Skin Cancer  Check your skin from head to toe regularly.  Tell your health care provider about any new moles or changes in moles, especially if there is a change in a mole's shape or color.  Also tell your health care provider if you have a mole that is larger than the size of a pencil eraser.  Always use sunscreen. Apply sunscreen liberally and repeatedly throughout the day.  Protect yourself by wearing long sleeves, pants, a wide-brimmed hat, and sunglasses whenever you are outside. Heart disease, diabetes, and high blood pressure  High blood pressure causes heart disease and increases the risk of stroke. High blood pressure is more likely to develop in: ? People who have blood  pressure in the high end of the normal range (130-139/85-89 mm Hg). ? People who are overweight or obese. ? People who are African American.  If you are 22-67 years of age, have your blood pressure checked every 3-5 years. If you are 33 years of age or older, have your blood pressure checked every year. You should have your blood pressure measured twice-once when you are at a hospital or clinic, and once when you are not at a hospital or clinic. Record the average of the two measurements. To check your blood pressure when you are not at a hospital or clinic, you can use: ? An automated blood pressure machine at a pharmacy. ? A home blood pressure monitor.  If you are between 22 years and 14 years old, ask your health care provider if you should take aspirin to prevent strokes.  Have regular diabetes screenings. This involves taking a blood sample to check your fasting blood sugar  level. ? If you are at a normal weight and have a low risk for diabetes, have this test once every three years after 23 years of age. ? If you are overweight and have a high risk for diabetes, consider being tested at a younger age or more often. Preventing infection Hepatitis B  If you have a higher risk for hepatitis B, you should be screened for this virus. You are considered at high risk for hepatitis B if: ? You were born in a country where hepatitis B is common. Ask your health care provider which countries are considered high risk. ? Your parents were born in a high-risk country, and you have not been immunized against hepatitis B (hepatitis B vaccine). ? You have HIV or AIDS. ? You use needles to inject street drugs. ? You live with someone who has hepatitis B. ? You have had sex with someone who has hepatitis B. ? You get hemodialysis treatment. ? You take certain medicines for conditions, including cancer, organ transplantation, and autoimmune conditions. Hepatitis C  Blood testing is recommended for: ? Everyone born from [birth year] through 1965. ? Anyone with known risk factors for hepatitis C. Sexually transmitted infections (STIs)  You should be screened for sexually transmitted infections (STIs) including gonorrhea and chlamydia if: ? You are sexually active and are younger than 23 years of age. ? You are older than 23 years of age and your health care provider tells you that you are at risk for this type of infection. ? Your sexual activity has changed since you were last screened and you are at an increased risk for chlamydia or gonorrhea. Ask your health care provider if you are at risk.  If you do not have HIV, but are at risk, it may be recommended that you take a prescription medicine daily to prevent HIV infection. This is called pre-exposure prophylaxis (PrEP). You are considered at risk if: ? You are sexually active and do not regularly use condoms or know the HIV status  of your partner(s). ? You take drugs by injection. ? You are sexually active with a partner who has HIV. Talk with your health care provider about whether you are at high risk of being infected with HIV. If you choose to begin PrEP, you should first be tested for HIV. You should then be tested every 3 months for as long as you are taking PrEP. Pregnancy  If you are premenopausal and you may become pregnant, ask your health care provider about preconception counseling.  If  you may become pregnant, take 400 to 800 micrograms (mcg) of folic acid every day.  If you want to prevent pregnancy, talk to your health care provider about birth control (contraception). Osteoporosis and menopause  Osteoporosis is a disease in which the bones lose minerals and strength with aging. This can result in serious bone fractures. Your risk for osteoporosis can be identified using a bone density scan.  If you are 47 years of age or older, or if you are at risk for osteoporosis and fractures, ask your health care provider if you should be screened.  Ask your health care provider whether you should take a calcium or vitamin D supplement to lower your risk for osteoporosis.  Menopause may have certain physical symptoms and risks.  Hormone replacement therapy may reduce some of these symptoms and risks. Talk to your health care provider about whether hormone replacement therapy is right for you. Follow these instructions at home:  Schedule regular health, dental, and eye exams.  Stay current with your immunizations.  Do not use any tobacco products including cigarettes, chewing tobacco, or electronic cigarettes.  If you are pregnant, do not drink alcohol.  If you are breastfeeding, limit how much and how often you drink alcohol.  Limit alcohol intake to no more than 1 drink per day for nonpregnant women. One drink equals 12 ounces of beer, 5 ounces of wine, or 1 ounces of hard liquor.  Do not use street  drugs.  Do not share needles.  Ask your health care provider for help if you need support or information about quitting drugs.  Tell your health care provider if you often feel depressed.  Tell your health care provider if you have ever been abused or do not feel safe at home. This information is not intended to replace advice given to you by your health care provider. Make sure you discuss any questions you have with your health care provider. Document Released: 05/11/2011 Document Revised: 04/02/2016 Document Reviewed: 07/30/2015 Elsevier Interactive Patient Education  2019 [employer].

## 2019-02-03 ENCOUNTER — Encounter (HOSPITAL_COMMUNITY): Payer: Self-pay

## 2019-02-03 ENCOUNTER — Other Ambulatory Visit: Payer: Self-pay

## 2019-02-03 ENCOUNTER — Ambulatory Visit (HOSPITAL_COMMUNITY)
Admission: EM | Admit: 2019-02-03 | Discharge: 2019-02-03 | Disposition: A | Discharge status: Home / Self Care | Payer: Self-pay | Attending: Family Medicine | Admitting: Family Medicine

## 2019-02-03 DIAGNOSIS — N898 Other specified noninflammatory disorders of vagina: Secondary | ICD-10-CM | POA: Insufficient documentation

## 2019-02-03 LAB — POCT URINALYSIS DIP (DEVICE)
Bilirubin Urine: NEGATIVE
Glucose, UA: NEGATIVE mg/dL
Hgb urine dipstick: NEGATIVE
Ketones, ur: NEGATIVE mg/dL
Nitrite: NEGATIVE
Protein, ur: NEGATIVE mg/dL
SPECIFIC GRAVITY, URINE: 1.025 (ref 1.005–1.030)
Urobilinogen, UA: 0.2 mg/dL (ref 0.0–1.0)
pH: 5.5 (ref 5.0–8.0)

## 2019-02-03 LAB — POCT PREGNANCY, URINE: Preg Test, Ur: NEGATIVE

## 2019-02-03 MED ORDER — FLUCONAZOLE 150 MG PO TABS: 150.0000 mg | ORAL_TABLET | Freq: Every day | ORAL | 0 refills | Status: DC

## 2019-02-03 MED ORDER — METRONIDAZOLE 0.75 % VA GEL: 1.0000 | Freq: Every day | VAGINAL | 0 refills | Status: AC

## 2019-02-03 NOTE — ED Provider Notes (Signed)
[facility]    CSN: [id number] Arrival date & time: 02/03/19  1134     History   Chief Complaint Chief Complaint  Patient presents with  . Vaginitis    HPI Monica [name] is a 23 y.o. female.   Is a 23 year old female who presents today with complaints of vaginal irritation, discharge and foul smell.  Symptoms have been constant for the past week.  She has not tried anything for her symptoms.  She is concerned for STDs.  Denies any associated abdominal pain, back pain, flank pain, fevers, dysuria, hematuria or urinary frequency.  Last menstrual period was 01/29/2019.  She is currently breast-feeding.  She is currently sexually active with one partner, unprotected but does use oral contraception.  ROS per HPI      Past Medical History:  Diagnosis Date  . Medical history non-contributory     Patient Active Problem List   Diagnosis Date Noted  . Fetal gastroschisis during pregnancy, antepartum 06/09/2018    Past Surgical History:  Procedure Laterality Date  . APPENDECTOMY      OB History    Gravida  2   Para  2   Term  1   Preterm  1   AB  0   Living  2     SAB  0   TAB  0   Ectopic  0   Multiple  0   Live Births  2            Home Medications    Prior to Admission medications   Medication Sig Start Date End Date Taking? Authorizing Provider  fluconazole (DIFLUCAN) 150 MG tablet Take 1 tablet (150 mg total) by mouth daily. 02/03/19   Dahlia Byes A, NP  metroNIDAZOLE (METROGEL) 0.75 % vaginal gel Place 1 Applicatorful vaginally at bedtime for 5 days. 02/03/19 02/08/19  Dahlia Byes A, NP  norgestimate-ethinyl estradiol (ORTHO-CYCLEN,SPRINTEC,PREVIFEM) 0.25-35 MG-MCG tablet Take 1 tablet by mouth daily. 11/17/18   [name], NP    Family History Family History  Problem Relation Age of Onset  . Diabetes Maternal Grandfather   . Healthy Mother   . Healthy Father     Social History Social History   Tobacco Use  .  Smoking status: Former Smoker    Last attempt to quit: 01/07/2016    Years since quitting: 3.0  . Smokeless tobacco: Never Used  Substance Use Topics  . Alcohol use: No  . Drug use: No     Allergies   Patient has no known allergies.   Review of Systems Review of Systems   Physical Exam Triage Vital Signs ED Triage Vitals [02/03/19 1210]  Enc Vitals Group     BP 112/78     Pulse Rate 78     Resp 18     Temp 98.6 F (37 C)     Temp src      SpO2 100 %     Weight      Height      Head Circumference      Peak Flow      Pain Score 0     Pain Loc      Pain Edu?      Excl. in GC?    No data found.  Updated Vital Signs BP 112/78   Pulse 78   Temp 98.6 F (37 C)   Resp 18   LMP 01/29/2019   SpO2 100%   Visual Acuity Right Eye Distance:  Left Eye Distance:   Bilateral Distance:    Right Eye Near:   Left Eye Near:    Bilateral Near:     Physical Exam Vitals signs and nursing note reviewed.  Constitutional:      General: She is not in acute distress.    Appearance: Normal appearance. She is not ill-appearing, toxic-appearing or diaphoretic.  HENT:     Head: Normocephalic.     Nose: Nose normal.     Mouth/Throat:     Pharynx: Oropharynx is clear.  Eyes:     Conjunctiva/sclera: Conjunctivae normal.  Neck:     Musculoskeletal: Normal range of motion.  Pulmonary:     Effort: Pulmonary effort is normal.  Abdominal:     Palpations: Abdomen is soft.     Tenderness: There is no abdominal tenderness.  Genitourinary:    Comments: Deferred  Musculoskeletal: Normal range of motion.  Skin:    General: Skin is warm and dry.     Findings: No rash.  Neurological:     Mental Status: She is alert.  Psychiatric:        Mood and Affect: Mood normal.      UC Treatments / Results  Labs (all labs ordered are listed, but only abnormal results are displayed) Labs Reviewed  POCT URINALYSIS DIP (DEVICE) - Abnormal; Notable for the following components:       Result Value   Leukocytes,Ua TRACE (*)    All other components within normal limits  POC URINE PREG, ED  POCT PREGNANCY, URINE  CERVICOVAGINAL ANCILLARY ONLY    EKG None  Radiology No results found.  Procedures Procedures (including critical care time)  Medications Ordered in UC Medications - No data to display  Initial Impression / Assessment and Plan / UC Course  I have reviewed the triage vital signs and the nursing notes.  Pertinent labs & imaging results that were available during my care of the patient were reviewed by me and considered in my medical decision making (see chart for details).    Vaginitis-   Based on symptoms and history we will go ahead and treat for bacterial vaginosis and yeast infection Sending swab for STD testing Lab pending Follow up as needed for continued or worsening symptoms  Final Clinical Impressions(s) / UC Diagnoses   Final diagnoses:  Vaginal discharge     Discharge Instructions     Based on symptoms we will go ahead and treat ou for bacteria and yeast infection Medicines sent to the pharmacy.  We will test for STDs and call with results if positive.  Urine did not show any infection and no pregnancy     ED Prescriptions    Medication Sig Dispense Auth. Provider   metroNIDAZOLE (METROGEL) 0.75 % vaginal gel Place 1 Applicatorful vaginally at bedtime for 5 days. 50 g Ashleynicole Mcclees A, NP   fluconazole (DIFLUCAN) 150 MG tablet Take 1 tablet (150 mg total) by mouth daily. 2 tablet Dahlia Byes A, NP     Controlled Substance Prescriptions [place] Controlled Substance Registry consulted? Not Applicable   Janace Aris, NP 02/03/19 1336

## 2019-02-03 NOTE — Discharge Instructions (Addendum)
Based on symptoms we will go ahead and treat ou for bacteria and yeast infection Medicines sent to the pharmacy.  We will test for STDs and call with results if positive.  Urine did not show any infection and no pregnancy

## 2019-02-03 NOTE — ED Triage Notes (Signed)
Pt presents with complaints of vaginal irritation and foul smelling discharge x 1 week. Pt concerned for STD's.

## 2019-02-06 LAB — CERVICOVAGINAL ANCILLARY ONLY
Bacterial vaginitis: POSITIVE — AB
CANDIDA VAGINITIS: NEGATIVE
Chlamydia: NEGATIVE
Neisseria Gonorrhea: NEGATIVE
Trichomonas: NEGATIVE

## 2019-03-22 ENCOUNTER — Other Ambulatory Visit: Payer: Self-pay

## 2019-03-22 ENCOUNTER — Encounter (HOSPITAL_COMMUNITY): Payer: Self-pay | Admitting: Emergency Medicine

## 2019-03-22 ENCOUNTER — Emergency Department (HOSPITAL_COMMUNITY): Payer: Self-pay

## 2019-03-22 ENCOUNTER — Emergency Department (HOSPITAL_COMMUNITY)
Admission: EM | Admit: 2019-03-22 | Discharge: 2019-03-23 | Disposition: A | Discharge status: Home / Self Care | Payer: Self-pay | Attending: Emergency Medicine | Admitting: Emergency Medicine

## 2019-03-22 DIAGNOSIS — R1011 Right upper quadrant pain: Secondary | ICD-10-CM | POA: Insufficient documentation

## 2019-03-22 DIAGNOSIS — Z79899 Other long term (current) drug therapy: Secondary | ICD-10-CM | POA: Insufficient documentation

## 2019-03-22 DIAGNOSIS — Z87891 Personal history of nicotine dependence: Secondary | ICD-10-CM | POA: Insufficient documentation

## 2019-03-22 DIAGNOSIS — R112 Nausea with vomiting, unspecified: Secondary | ICD-10-CM | POA: Insufficient documentation

## 2019-03-22 LAB — CBC WITH DIFFERENTIAL/PLATELET
Abs Immature Granulocytes: 0.02 10*3/uL (ref 0.00–0.07)
Basophils Absolute: 0 10*3/uL (ref 0.0–0.1)
Basophils Relative: 1 %
Eosinophils Absolute: 0.2 10*3/uL (ref 0.0–0.5)
Eosinophils Relative: 2 %
HCT: 37.8 % (ref 36.0–46.0)
Hemoglobin: 12.5 g/dL (ref 12.0–15.0)
Immature Granulocytes: 0 %
Lymphocytes Relative: 28 %
Lymphs Abs: 2.4 10*3/uL (ref 0.7–4.0)
MCH: 28.6 pg (ref 26.0–34.0)
MCHC: 33.1 g/dL (ref 30.0–36.0)
MCV: 86.5 fL (ref 80.0–100.0)
Monocytes Absolute: 0.5 10*3/uL (ref 0.1–1.0)
Monocytes Relative: 6 %
Neutro Abs: 5.4 10*3/uL (ref 1.7–7.7)
Neutrophils Relative %: 63 %
Platelets: 327 10*3/uL (ref 150–400)
RBC: 4.37 MIL/uL (ref 3.87–5.11)
RDW: 12.5 % (ref 11.5–15.5)
WBC: 8.6 10*3/uL (ref 4.0–10.5)
nRBC: 0 % (ref 0.0–0.2)

## 2019-03-22 LAB — COMPREHENSIVE METABOLIC PANEL
ALT: 29 U/L (ref 0–44)
AST: 24 U/L (ref 15–41)
Albumin: 4.7 g/dL (ref 3.5–5.0)
Alkaline Phosphatase: 85 U/L (ref 38–126)
Anion gap: 7 (ref 5–15)
BUN: 18 mg/dL (ref 6–20)
CO2: 26 mmol/L (ref 22–32)
Calcium: 9.3 mg/dL (ref 8.9–10.3)
Chloride: 104 mmol/L (ref 98–111)
Creatinine, Ser: 0.89 mg/dL (ref 0.44–1.00)
GFR calc Af Amer: >60 mL/min (ref >60)
GFR calc non Af Amer: >60 mL/min (ref >60)
Glucose, Bld: 100 mg/dL — ABNORMAL HIGH (ref 70–99)
Potassium: 3.6 mmol/L (ref 3.5–5.1)
Sodium: 137 mmol/L (ref 135–145)
Total Bilirubin: 0.4 mg/dL (ref 0.3–1.2)
Total Protein: 8.2 g/dL — ABNORMAL HIGH (ref 6.5–8.1)

## 2019-03-22 LAB — URINALYSIS, ROUTINE W REFLEX MICROSCOPIC
Bilirubin Urine: NEGATIVE
Glucose, UA: NEGATIVE mg/dL
Hgb urine dipstick: NEGATIVE
Ketones, ur: NEGATIVE mg/dL
Leukocytes,Ua: NEGATIVE
Nitrite: NEGATIVE
Protein, ur: NEGATIVE mg/dL
Specific Gravity, Urine: 1.021 (ref 1.005–1.030)
pH: 6 (ref 5.0–8.0)

## 2019-03-22 MED ORDER — SODIUM CHLORIDE 0.9 % IV BOLUS
1000.0000 mL | Freq: Once | INTRAVENOUS | Status: AC
  Administered 2019-03-22: 1000 mL via INTRAVENOUS

## 2019-03-22 MED ORDER — ONDANSETRON HCL 4 MG/2ML IJ SOLN
4.0000 mg | Freq: Once | INTRAMUSCULAR | Status: AC
  Administered 2019-03-22: 4 mg via INTRAVENOUS
  Filled 2019-03-22: qty 2

## 2019-03-22 MED ORDER — MORPHINE SULFATE (PF) 2 MG/ML IV SOLN
2.0000 mg | Freq: Once | INTRAVENOUS | Status: AC
  Administered 2019-03-22: 2 mg via INTRAVENOUS
  Filled 2019-03-22: qty 1

## 2019-03-22 NOTE — ED Notes (Signed)
Ultrasound at bedside

## 2019-03-22 NOTE — ED Provider Notes (Signed)
[facility]-EMERGENCY DEPT Provider Note   CSN: [id number] Arrival date & time: 03/22/19  2114    History   Chief Complaint Chief Complaint  Patient presents with  . Abdominal Pain  . Nausea    HPI Monica [name] is a 23 y.o. female with recent spontaneous vaginal delivery on 10/12/2018 with uncomplicated postpartum course, currently lactating, recently diagnosed with biliary sludge, presenting to the emergency department with worsening right upper quadrant abdominal pain since today.  Patient states she was seen after she delivered and had ultrasound due to right upper quadrant abdominal pain which showed sludge in her gallbladder (per chart review).  She states she was given a referral for general surgeon and told she needed to have her gallbladder removed, however she has been preoccupied with her child who is been in and out of the NICU and had surgery at [facility] for gastroschisis.  She states she has been having pain that is been coming and going, however severely worsened today.  The pain feels exactly like it did when she was initially evaluated for right upper quadrant pain.  She has associated nausea with vomiting.  The pain is in her right upper quadrant and radiates around to her right back and shoulder blade.  She denies any pelvic or urinary symptoms, shortness of breath, diarrhea, constipation, or fevers.  No interventions tried at home for pain prior to arrival.     The history is provided by the patient and medical records.    Past Medical History:  Diagnosis Date  . Medical history non-contributory     Patient Active Problem List   Diagnosis Date Noted  . Fetal gastroschisis during pregnancy, antepartum 06/09/2018    Past Surgical History:  Procedure Laterality Date  . APPENDECTOMY       OB History    Gravida  2   Para  2   Term  1   Preterm  1   AB  0   Living  2     SAB  0   TAB  0   Ectopic  0   Multiple  0    Live Births  2            Home Medications    Prior to Admission medications   Medication Sig Start Date End Date Taking? Authorizing Provider  fluconazole (DIFLUCAN) 150 MG tablet Take 1 tablet (150 mg total) by mouth daily. Patient not taking: Reported on 03/22/2019 02/03/19   Dahlia Byes A, NP  norgestimate-ethinyl estradiol (ORTHO-CYCLEN,SPRINTEC,PREVIFEM) 0.25-35 MG-MCG tablet Take 1 tablet by mouth daily. Patient not taking: Reported on 03/22/2019 11/17/18   [name], NP    Family History Family History  Problem Relation Age of Onset  . Diabetes Maternal Grandfather   . Healthy Mother   . Healthy Father     Social History Social History   Tobacco Use  . Smoking status: Former Smoker    Last attempt to quit: 01/07/2016    Years since quitting: 3.2  . Smokeless tobacco: Never Used  Substance Use Topics  . Alcohol use: No  . Drug use: No     Allergies   Patient has no known allergies.   Review of Systems Review of Systems  Constitutional: Negative for fever.  Respiratory: Negative for cough and shortness of breath.   Cardiovascular: Negative for chest pain.  Gastrointestinal: Positive for abdominal pain, nausea and vomiting. Negative for diarrhea.  Genitourinary: Negative for dysuria, hematuria, vaginal bleeding and  vaginal discharge.  All other systems reviewed and are negative.    Physical Exam Updated Vital Signs BP 123/78 (BP Location: Left Arm)   Pulse 66   Temp 98.6 F (37 C) (Oral)   Resp 16   Ht 5\' 4"  (1.626 m)   Wt 58.1 kg   SpO2 100%   BMI 21.97 kg/m   Physical Exam Vitals signs and nursing note reviewed.  Constitutional:      Appearance: She is well-developed. She is not ill-appearing.     Comments: Patient appears uncomfortable, however is not in acute distress.  HENT:     Head: Normocephalic and atraumatic.  Eyes:     Conjunctiva/sclera: Conjunctivae normal.  Cardiovascular:     Rate and Rhythm: Normal rate and regular  rhythm.  Pulmonary:     Effort: Pulmonary effort is normal.     Breath sounds: Normal breath sounds.  Abdominal:     General: Abdomen is flat. Bowel sounds are normal.     Palpations: Abdomen is soft.     Tenderness: There is abdominal tenderness in the right upper quadrant and epigastric area. There is right CVA tenderness. There is no guarding or rebound. Positive signs include Murphy's sign.  Skin:    General: Skin is warm.  Neurological:     Mental Status: She is alert.  Psychiatric:        Behavior: Behavior normal.      ED Treatments / Results  Labs (all labs ordered are listed, but only abnormal results are displayed) Labs Reviewed  COMPREHENSIVE METABOLIC PANEL - Abnormal; Notable for the following components:      Result Value   Glucose, Bld 100 (*)    Total Protein 8.2 (*)    All other components within normal limits  CBC WITH DIFFERENTIAL/PLATELET  URINALYSIS, ROUTINE W REFLEX MICROSCOPIC    EKG None  Radiology No results found.  Procedures Procedures (including critical care time)  Medications Ordered in ED Medications  ondansetron (ZOFRAN) injection 4 mg (4 mg Intravenous Given 03/22/19 2228)  morphine 2 MG/ML injection 2 mg (2 mg Intravenous Given 03/22/19 2228)  sodium chloride 0.9 % bolus 1,000 mL (1,000 mLs Intravenous New Bag/Given 03/22/19 2228)     Initial Impression / Assessment and Plan / ED Course  I have reviewed the triage vital signs and the nursing notes.  Pertinent labs & imaging results that were available during my care of the patient were reviewed by me and considered in my medical decision making (see chart for details).        Patient presenting with worsening right upper quadrant abdominal pain after recent diagnosis of biliary sludge and recommendation for elective cholecystectomy.  Patient states she has been preoccupied at home with her infant that has been in and out of the NICU since birth in December.  She has not scheduled  an outpatient appointment.  Today her pain severely worsened to the point where she cannot get out of bed.  She has associated nausea and vomiting.  The pain is exactly like recent episodes of right upper quadrant pain.  No shortness of breath or chest pain, low suspicion for PE given patient's abdominal sx. Abdomen is soft with right upper quadrant tenderness and positive Murphy sign.  Labs and right upper quadrant ultrasound obtained to rule out acute cholecystitis.    Labs are wnl, normal LFTs. No leukocytosis. VSS.  On reevaluation, patient reports improvement in symptoms and is currently asymptomatic.  Discussed symptomatic management at home  with prescribed pain medication and antiemetics.  Encourage patient to call the general surgery clinic to schedule outpatient follow-up to discuss treatment options.  Ultrasound results to be followed up by oncoming PA Humes.  If negative for findings concerning for cholecystitis versus choledocholithiasis, patient to be discharged with symptomatic management and referral.  Final Clinical Impressions(s) / ED Diagnoses   Final diagnoses:  RUQ abdominal pain    ED Discharge Orders    None       Mickaela Starlin, [place] N, PA-C 03/23/19 0019    Loren Racer, MD 03/23/19 2110

## 2019-03-22 NOTE — ED Triage Notes (Signed)
Patient complains of lower right quadrant abdominal pain that radiates to the right flank. Along with pain she also has complaints of nausea. She had a similar episode a few months ago. At that time she was told she may have to have her gallbladder removed.   EMS vitals: 126/88 BP 100 HR 100% O2 sats on room air 99.3 temp

## 2019-03-22 NOTE — ED Notes (Signed)
Bed: BT51 Expected date:  Expected time:  Means of arrival:  Comments: Rm 23

## 2019-03-22 NOTE — ED Notes (Signed)
Pt ambulatory with steady gait to BR and back to room.

## 2019-03-23 MED ORDER — HYDROCODONE-ACETAMINOPHEN 5-325 MG PO TABS: 1.0000 | ORAL_TABLET | Freq: Four times a day (QID) | ORAL | 0 refills | Status: DC | PRN

## 2019-03-23 MED ORDER — ONDANSETRON HCL 4 MG/2ML IJ SOLN
4.0000 mg | Freq: Once | INTRAMUSCULAR | Status: AC
  Administered 2019-03-23: 4 mg via INTRAVENOUS
  Filled 2019-03-23: qty 2

## 2019-03-23 MED ORDER — ONDANSETRON 4 MG PO TBDP: 4.0000 mg | ORAL_TABLET | Freq: Three times a day (TID) | ORAL | 0 refills | Status: DC | PRN

## 2019-03-23 NOTE — ED Provider Notes (Signed)
12:25 AM [place] negative, specifically with normal RUQ ultrasound. No gallstones or wall thickening. VSS. Plan for outpatient symptom control and surgical referral.  US Abdomen Limited Ruq  Result Date: 03/23/2019 CLINICAL DATA:  Right upper quadrant pain EXAM: ULTRASOUND ABDOMEN LIMITED RIGHT UPPER QUADRANT COMPARISON:  None. FINDINGS: Gallbladder: No gallstones or wall thickening visualized. No sonographic Murphy sign noted by sonographer. Common bile duct: Diameter: 2 mm Liver: No focal lesion identified. Within normal limits in parenchymal echogenicity. Portal vein is patent on color Doppler imaging with normal direction of blood flow towards the liver. IMPRESSION: Normal right upper quadrant ultrasound Electronically Signed   By: Deatra Robinson M.D.   On: 03/23/2019 00:16      Antony Madura, PA-C 03/23/19 [id number]    Geoffery Lyons, MD 03/23/19 [phone]

## 2019-03-23 NOTE — Discharge Instructions (Addendum)
Limit fatty/greasy foods as this can worsen your abdominal pain. You can take zofran every 8 hours as needed for nausea. You can take hydrocodone every 6 hours as needed for severe pain. Be advised, it is recommended to pump and dump your next planned breastfeeding/pumping session after you have taken a dose of this medication. This medication has the potential to cause sedation in your baby. Schedule an appointment with the general surgeons to discuss treatment options. If you are unable to manage your symptoms at home with these medications, return to the ER.

## 2019-04-27 ENCOUNTER — Ambulatory Visit: Payer: Self-pay | Admitting: General Surgery

## 2019-05-31 ENCOUNTER — Other Ambulatory Visit: Payer: Self-pay

## 2019-05-31 DIAGNOSIS — Z20822 Contact with and (suspected) exposure to covid-19: Secondary | ICD-10-CM

## 2019-06-03 LAB — NOVEL CORONAVIRUS, NAA: SARS-CoV-2, NAA: DETECTED — AB

## 2019-08-06 ENCOUNTER — Inpatient Hospital Stay (HOSPITAL_COMMUNITY)
Admission: AD | Admit: 2019-08-06 | Discharge: 2019-08-07 | Disposition: A | Discharge status: Home / Self Care | Payer: Self-pay | Attending: Obstetrics and Gynecology | Admitting: Obstetrics and Gynecology

## 2019-08-06 DIAGNOSIS — N83202 Unspecified ovarian cyst, left side: Secondary | ICD-10-CM | POA: Insufficient documentation

## 2019-08-06 DIAGNOSIS — Z87891 Personal history of nicotine dependence: Secondary | ICD-10-CM | POA: Insufficient documentation

## 2019-08-06 DIAGNOSIS — R109 Unspecified abdominal pain: Secondary | ICD-10-CM | POA: Insufficient documentation

## 2019-08-06 DIAGNOSIS — O26899 Other specified pregnancy related conditions, unspecified trimester: Secondary | ICD-10-CM

## 2019-08-06 DIAGNOSIS — N939 Abnormal uterine and vaginal bleeding, unspecified: Secondary | ICD-10-CM

## 2019-08-06 DIAGNOSIS — O3680X0 Pregnancy with inconclusive fetal viability, not applicable or unspecified: Secondary | ICD-10-CM

## 2019-08-06 DIAGNOSIS — Z833 Family history of diabetes mellitus: Secondary | ICD-10-CM | POA: Insufficient documentation

## 2019-08-06 DIAGNOSIS — Z3A01 Less than 8 weeks gestation of pregnancy: Secondary | ICD-10-CM | POA: Insufficient documentation

## 2019-08-06 DIAGNOSIS — O3481 Maternal care for other abnormalities of pelvic organs, first trimester: Secondary | ICD-10-CM | POA: Insufficient documentation

## 2019-08-06 DIAGNOSIS — O4690 Antepartum hemorrhage, unspecified, unspecified trimester: Secondary | ICD-10-CM

## 2019-08-06 DIAGNOSIS — O209 Hemorrhage in early pregnancy, unspecified: Secondary | ICD-10-CM | POA: Insufficient documentation

## 2019-08-06 DIAGNOSIS — O9989 Other specified diseases and conditions complicating pregnancy, childbirth and the puerperium: Secondary | ICD-10-CM | POA: Insufficient documentation

## 2019-08-07 ENCOUNTER — Inpatient Hospital Stay (HOSPITAL_COMMUNITY): Payer: Self-pay

## 2019-08-07 ENCOUNTER — Encounter (HOSPITAL_COMMUNITY): Payer: Self-pay

## 2019-08-07 DIAGNOSIS — O3680X0 Pregnancy with inconclusive fetal viability, not applicable or unspecified: Secondary | ICD-10-CM

## 2019-08-07 DIAGNOSIS — R109 Unspecified abdominal pain: Secondary | ICD-10-CM

## 2019-08-07 DIAGNOSIS — Z3A01 Less than 8 weeks gestation of pregnancy: Secondary | ICD-10-CM

## 2019-08-07 DIAGNOSIS — O26891 Other specified pregnancy related conditions, first trimester: Secondary | ICD-10-CM

## 2019-08-07 DIAGNOSIS — O4691 Antepartum hemorrhage, unspecified, first trimester: Secondary | ICD-10-CM

## 2019-08-07 LAB — URINALYSIS, ROUTINE W REFLEX MICROSCOPIC
Bilirubin Urine: NEGATIVE
Glucose, UA: NEGATIVE mg/dL
Hgb urine dipstick: NEGATIVE
Ketones, ur: NEGATIVE mg/dL
Leukocytes,Ua: NEGATIVE
Nitrite: NEGATIVE
Protein, ur: NEGATIVE mg/dL
Specific Gravity, Urine: 1.02 (ref 1.005–1.030)
pH: 7 (ref 5.0–8.0)

## 2019-08-07 LAB — CBC
HCT: 34.4 % — ABNORMAL LOW (ref 36.0–46.0)
Hemoglobin: 11.7 g/dL — ABNORMAL LOW (ref 12.0–15.0)
MCH: 29.1 pg (ref 26.0–34.0)
MCHC: 34 g/dL (ref 30.0–36.0)
MCV: 85.6 fL (ref 80.0–100.0)
Platelets: 340 10*3/uL (ref 150–400)
RBC: 4.02 MIL/uL (ref 3.87–5.11)
RDW: 13.3 % (ref 11.5–15.5)
WBC: 9 10*3/uL (ref 4.0–10.5)
nRBC: 0 % (ref 0.0–0.2)

## 2019-08-07 LAB — HCG, QUANTITATIVE, PREGNANCY: hCG, Beta Chain, Quant, S: 3467 m[IU]/mL — ABNORMAL HIGH (ref <5)

## 2019-08-07 LAB — WET PREP, GENITAL
Clue Cells Wet Prep HPF POC: NONE SEEN
Sperm: NONE SEEN
Trich, Wet Prep: NONE SEEN
Yeast Wet Prep HPF POC: NONE SEEN

## 2019-08-07 MED ORDER — ACETAMINOPHEN 500 MG PO TABS
1000.0000 mg | ORAL_TABLET | Freq: Once | ORAL | Status: AC
  Administered 2019-08-07: 1000 mg via ORAL
  Filled 2019-08-07: qty 2

## 2019-08-07 NOTE — MAU Provider Note (Signed)
History     CSN: [id number]  Arrival date and time: 08/06/19 2329   First Provider Initiated Contact with Patient 08/07/19 0157      Chief Complaint  Patient presents with  . Abdominal Pain   Monica [name] is a 23 y.o. [id number] at [redacted]w[redacted]d by definite LMP who has not established care.  She presents today for Abdominal Pain that started Saturday in the morning.  She states the pain was intermittent cramping that she states "was bad" causing her to call off work.  She reports that that she was also having some lower back pain that radiated to her shoulders. She states she did not take anything for the pain.  She also reports spotting that she noticed that morning, that was intermittent throughout the day.  She states the bleeding has since stopped, but she continues to have back and abdominal pain.  She rates the pain a 6/10 and she has not taken anything. She goes on to report that she went to [facility]" on Friday and had an [place], but nothing was seen.  She states that they told her it may be too early or ectopic, but did not perform any blood work.       OB History    Gravida  3   Para  2   Term  1   Preterm  1   AB  0   Living  2     SAB  0   TAB  0   Ectopic  0   Multiple  0   Live Births  2           Past Medical History:  Diagnosis Date  . Medical history non-contributory     Past Surgical History:  Procedure Laterality Date  . APPENDECTOMY      Family History  Problem Relation Age of Onset  . Diabetes Maternal Grandfather   . Healthy Mother   . Healthy Father     Social History   Tobacco Use  . Smoking status: Former Smoker    Quit date: 01/07/2016    Years since quitting: 3.5  . Smokeless tobacco: Never Used  Substance Use Topics  . Alcohol use: No  . Drug use: No    Allergies: No Known Allergies  Medications Prior to Admission  Medication Sig Dispense Refill Last Dose  . fluconazole (DIFLUCAN) 150 MG tablet Take 1  tablet (150 mg total) by mouth daily. (Patient not taking: Reported on 03/22/2019) 2 tablet 0   . HYDROcodone-acetaminophen (NORCO/VICODIN) 5-325 MG tablet Take 1 tablet by mouth every 6 (six) hours as needed for severe pain. 10 tablet 0   . norgestimate-ethinyl estradiol (ORTHO-CYCLEN,SPRINTEC,PREVIFEM) 0.25-35 MG-MCG tablet Take 1 tablet by mouth daily. (Patient not taking: Reported on 03/22/2019) 1 Package 11   . ondansetron (ZOFRAN ODT) 4 MG disintegrating tablet Take 1 tablet (4 mg total) by mouth every 8 (eight) hours as needed for nausea or vomiting. 20 tablet 0     Review of Systems  Constitutional: Negative for chills and fever.  Respiratory: Negative for cough and shortness of breath.   Gastrointestinal: Positive for abdominal pain. Negative for constipation, diarrhea, nausea and vomiting.  Genitourinary: Positive for vaginal bleeding (None today) and vaginal discharge (White milky with "bad smell-fishy"). Negative for difficulty urinating and dysuria.  Musculoskeletal: Positive for back pain.  Neurological: Positive for headaches. Negative for dizziness and light-headedness.   Physical Exam   Blood pressure 125/71, pulse 86, temperature 98.5  F (36.9 C), temperature source Oral, resp. rate 17, last menstrual period 06/24/2019, SpO2 100 %, unknown if currently breastfeeding.  Physical Exam  Constitutional: She is oriented to person, place, and time. She appears well-developed and well-nourished. No distress.  HENT:  Head: Normocephalic and atraumatic.  Eyes: Conjunctivae are normal.  Neck: Normal range of motion.  Cardiovascular: Normal rate.  Respiratory: Effort normal.  GI: Soft.  Genitourinary: Cervix exhibits no motion tenderness, no discharge and no friability.    Vaginal discharge present.     No vaginal bleeding.  No bleeding in the vagina.    Genitourinary Comments: Speculum Exam: -Normal External Genitalia: Non tender, no apparent discharge at introitus.  -Vaginal  Vault: Pink mucosa with good rugae. Small amt creamy white discharge, no apparent odor -wet prep collected -Cervix:Pink, no lesions, cysts, or polyps.  Appears closed. No active bleeding from os-GC/CT collected -Bimanual Exam:  Tenderness in cul de sac., bilaterally.    Musculoskeletal: Normal range of motion.  Neurological: She is alert and oriented to person, place, and time.  Skin: Skin is warm and dry.  Psychiatric: She has a normal mood and affect. Her behavior is normal.    MAU Course  Procedures Results for orders placed or performed during the hospital encounter of 08/06/19 (from the past 24 hour(s))  Urinalysis, Routine w reflex microscopic     Status: Abnormal   Collection Time: 08/07/19  1:41 AM  Result Value Ref Range   Color, Urine YELLOW YELLOW   APPearance CLOUDY (A) CLEAR   Specific Gravity, Urine 1.020 1.005 - 1.030   pH 7.0 5.0 - 8.0   Glucose, UA NEGATIVE NEGATIVE mg/dL   Hgb urine dipstick NEGATIVE NEGATIVE   Bilirubin Urine NEGATIVE NEGATIVE   Ketones, ur NEGATIVE NEGATIVE mg/dL   Protein, ur NEGATIVE NEGATIVE mg/dL   Nitrite NEGATIVE NEGATIVE   Leukocytes,Ua NEGATIVE NEGATIVE  Wet prep, genital     Status: Abnormal   Collection Time: 08/07/19  2:12 AM  Result Value Ref Range   Yeast Wet Prep HPF POC NONE SEEN NONE SEEN   Trich, Wet Prep NONE SEEN NONE SEEN   Clue Cells Wet Prep HPF POC NONE SEEN NONE SEEN   WBC, Wet Prep HPF POC FEW (A) NONE SEEN   Sperm NONE SEEN   CBC     Status: Abnormal   Collection Time: 08/07/19  2:30 AM  Result Value Ref Range   WBC 9.0 4.0 - 10.5 K/uL   RBC 4.02 3.87 - 5.11 MIL/uL   Hemoglobin 11.7 (L) 12.0 - 15.0 g/dL   HCT 16.134.4 (L) 09.636.0 - 04.546.0 %   MCV 85.6 80.0 - 100.0 fL   MCH 29.1 26.0 - 34.0 pg   MCHC 34.0 30.0 - 36.0 g/dL   RDW 40.913.3 81.111.5 - 91.415.5 %   Platelets 340 150 - 400 K/uL   nRBC 0.0 0.0 - 0.2 %   Kore[place] Ob Less Than 14 Weeks With Ob Transvaginal  Result Date: 08/07/2019 CLINICAL DATA:  Pregnant patient in  first-trimester pregnancy with vaginal bleeding and abdominal pain. EXAM: OBSTETRIC <14 WK US AND TRANSVAGINAL OB US TECHNIQUE: Both transabdominal and transvaginal ultrasound examinations were performed for complete evaluation of the gestation as well as the maternal uterus, adnexal regions, and pelvic cul-de-sac. Transvaginal technique was performed to assess early pregnancy. COMPARISON:  None this pregnancy. FINDINGS: Intrauterine gestational sac: Single Yolk sac:  Probable early/small. Embryo:  Not Visualized. Cardiac Activity: Not Visualized. MSD: 5.3 mm   5 w  2 d Subchorionic hemorrhage:  None visualized. Maternal uterus/adnexae: Both ovaries are visualized and are normal. Corpus luteal cyst in the left ovary. No suspicious adnexal mass. Small amount of free fluid in the pelvis. IMPRESSION: Probable early intrauterine gestational sac with a yolk sac, but no fetal pole or cardiac activity yet visualized. Recommend follow-up quantitative B-HCG levels and follow-up US in 10 days to assess viability. This recommendation follows SRU consensus guidelines: Diagnostic Criteria for Nonviable Pregnancy Early in the First Trimester. Malva Limes Med 2013; 474:2595-63. Electronically Signed   By: Narda Rutherford M.D.   On: 08/07/2019 03:14    MDM Pelvic Exam; Wet Prep and GC/CT Labs: UA, UPT, CBC, hCG Ultrasound Pain Medication Assessment and Plan  23 year old 6.2 weeks by LMP Abdominal Pain Vaginal Bleeding  -Reviewed POC. -Exam findings discussed. -Cultures collected and pending.  -Tylenol ordered. -Will send for [place] and reassess.    Cherre Robins MSN, CNM 08/07/2019, 1:57 AM   Reassessment (3:49 AM) IUGS with probable Yolk Sac  -Patient reports improvement in pain. -[place] results discussed as well as need for follow up. -Patient informed that since yolk sac is probable, then would repeat quant in 48 hours.  -Educated on how quants can determine if pregnancy is viable or ectopic. -Questions and  concerns regarding findings addressed.  -Wet prep results discussed with patient. -Informed that GC/CT will return within 2-3 days and no news is good news.  -Informed that quant still pending, but will release results to Mychart. Patient agreeable.  -Patient scheduled for repeat quant on Wednesday Sept 30, 2020 at 11am. -Order placed for repeat US in 10-14 days per recommendation. -patient informed of POC and questions who will care for her if she has an ectopic pregnancy. -Reassured that [facility] would manage care until viable IUP confirmed.  -Bleeding Precautions.  -Encouraged to call or return to MAU if symptoms worsen or with the onset of new symptoms. -Discharged to home in stable condition.  Cherre Robins MSN, CNM Advanced Practice Provider, [facility] [employer] [facility]

## 2019-08-07 NOTE — Discharge Instructions (Signed)
Abdominal Pain During Pregnancy ° °Belly (abdominal) pain is common during pregnancy. There are many possible causes. Most of the time, it is not a serious problem. Other times, it can be a sign that something is wrong with the pregnancy. Always tell your doctor if you have belly pain. °Follow these instructions at home: °· Do not have sex or put anything in your vagina until your pain goes away completely. °· Get plenty of rest until your pain gets better. °· Drink enough fluid to keep your pee (urine) pale yellow. °· Take over-the-counter and prescription medicines only as told by your doctor. °· Keep all follow-up visits as told by your doctor. This is important. °Contact a doctor if: °· Your pain continues or gets worse after resting. °· You have lower belly pain that: °? Comes and goes at regular times. °? Spreads to your back. °? Feels like menstrual cramps. °· You have pain or burning when you pee (urinate). °Get help right away if: °· You have a fever or chills. °· You have vaginal bleeding. °· You are leaking fluid from your vagina. °· You are passing tissue from your vagina. °· You throw up (vomit) for more than 24 hours. °· You have watery poop (diarrhea) for more than 24 hours. °· Your baby is moving less than usual. °· You feel very weak or faint. °· You have shortness of breath. °· You have very bad pain in your upper belly. °Summary °· Belly (abdominal) pain is common during pregnancy. There are many possible causes. °· If you have belly pain during pregnancy, tell your doctor right away. °· Keep all follow-up visits as told by your doctor. This is important. °This information is not intended to replace advice given to you by your health care provider. Make sure you discuss any questions you have with your health care provider. °Document Released: 10/14/2009 Document Revised: 02/13/2019 Document Reviewed: 01/28/2017 °Elsevier Patient Education © 2020 [employer] Inc. ° °

## 2019-08-07 NOTE — MAU Note (Addendum)
Pt. Reports "spotty" bleeding yesterday and today notes white discharge with foul smell and back and lower abdominal pain 6/10. She went to a clinic on 08/04/2019 and had a vaginal ultrasound but they did not see anything and was advised to go to an OB to rule out an ectopic pregnancy.

## 2019-08-08 ENCOUNTER — Telehealth: Payer: Self-pay | Admitting: Family Medicine

## 2019-08-08 LAB — CERVICOVAGINAL ANCILLARY ONLY
Chlamydia: NEGATIVE
Neisseria Gonorrhea: NEGATIVE

## 2019-08-08 NOTE — Telephone Encounter (Signed)
Spoke to patient about her appointment on 9/30 @ 11:00. Patient instructed to wear a face mask for the entire appointment and no visitors are allowed with her during the visit. Patient screened for covid symptoms and denied having any

## 2019-08-09 ENCOUNTER — Ambulatory Visit: Payer: Self-pay

## 2019-08-09 ENCOUNTER — Telehealth: Payer: Self-pay

## 2019-08-09 NOTE — Telephone Encounter (Signed)
Per review pt no showed for stat appt. Attempted to contact pt via phone; vm left informing pt she missed an appt in our office. Asked pt to return phone call or go to the MAU for follow up. Will also send MyChart message.

## 2019-08-17 ENCOUNTER — Ambulatory Visit (HOSPITAL_COMMUNITY): Admission: RE | Admit: 2019-08-17 | Payer: Self-pay | Source: Ambulatory Visit

## 2019-08-17 ENCOUNTER — Ambulatory Visit: Payer: Self-pay

## 2019-08-22 IMAGING — US US MFM UA CORD DOPPLER
1 series · 13 of 28 positions shown · non-contrast
Comparison: none

[Series 1: us mfm ua cord doppler · 65 acquisitions, 13 frames shown]
[im 3/65]
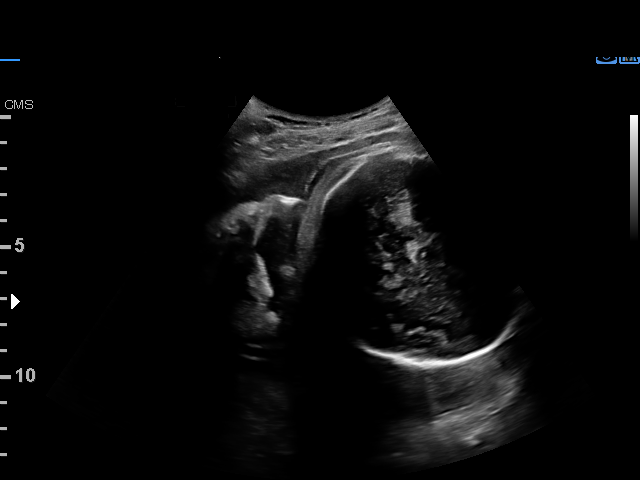
[im 8/65]
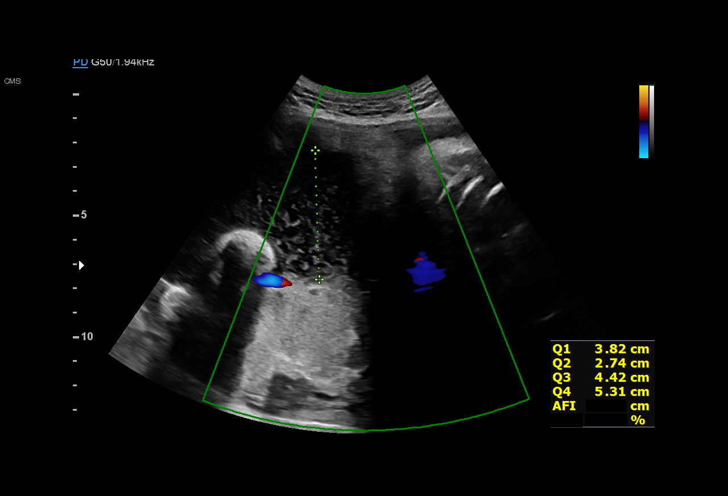
[im 12/65]
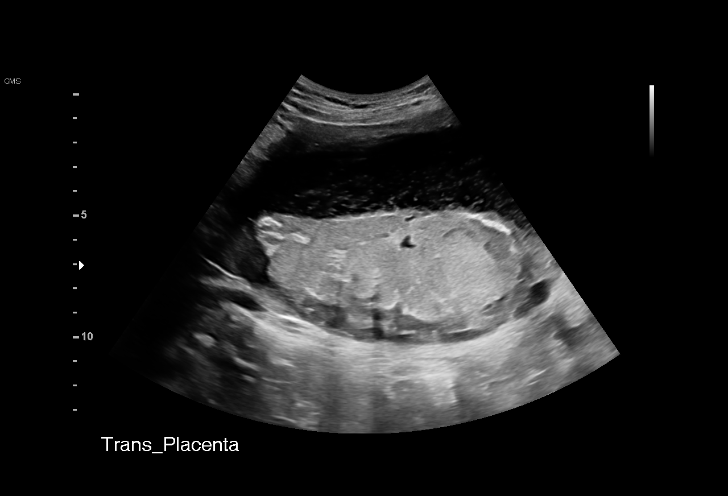
[im 17/65]
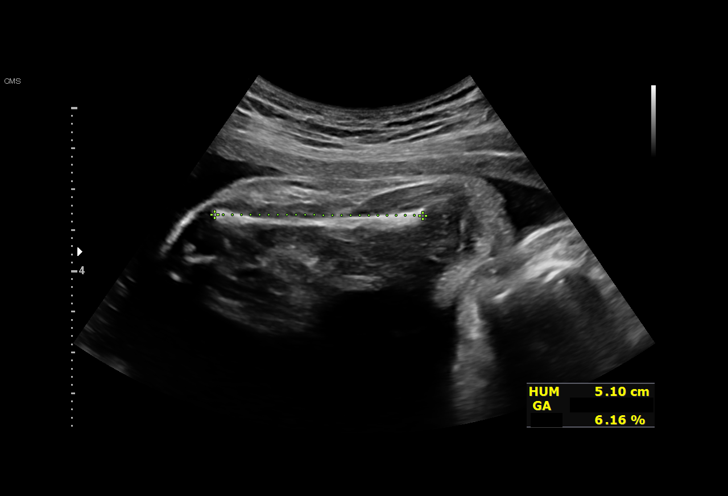
[im 22/65]
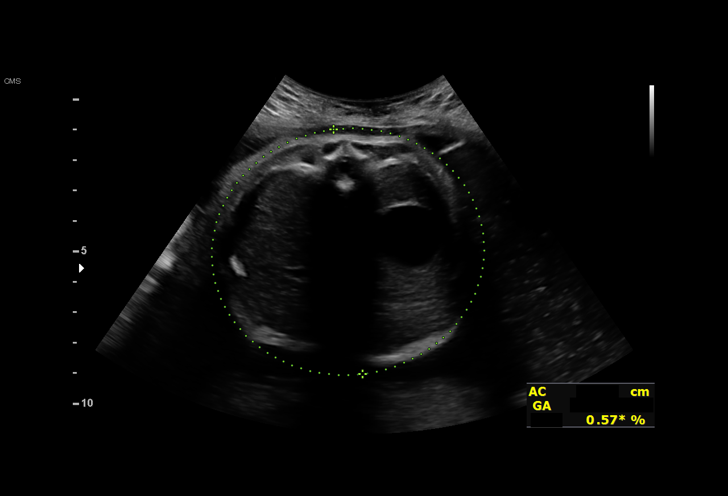
[im 27/65]
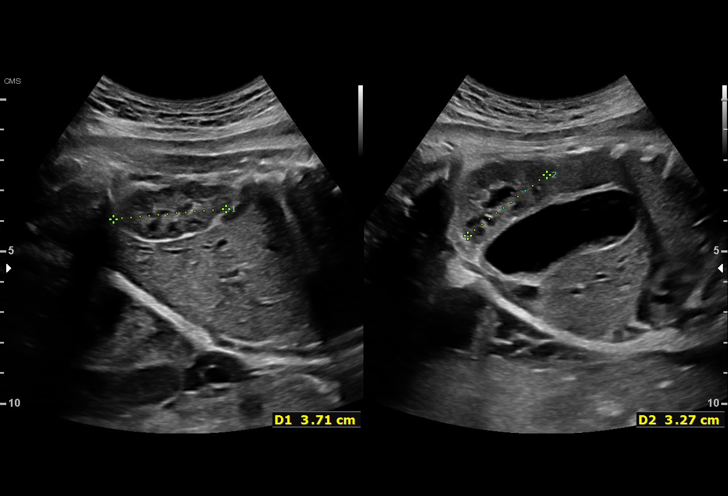
[im 34/65]
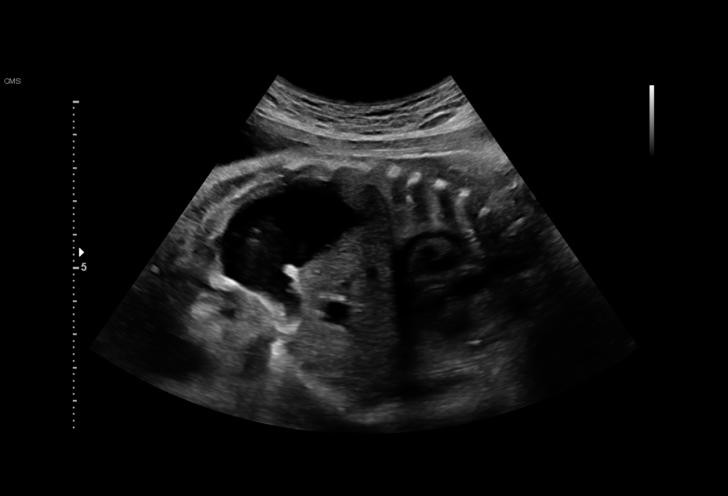
[im 38/65]
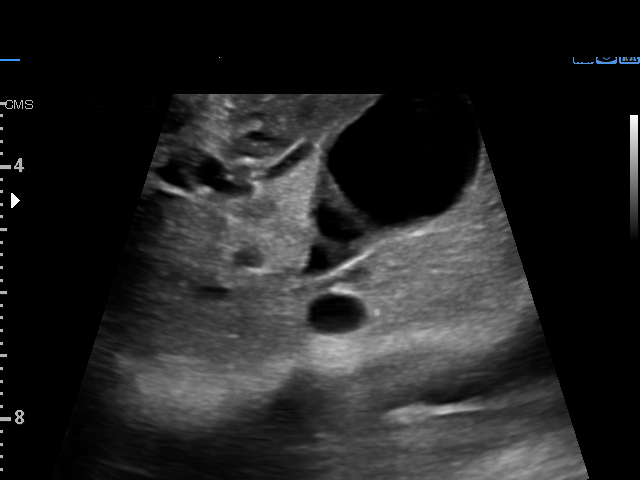
[im 43/65]
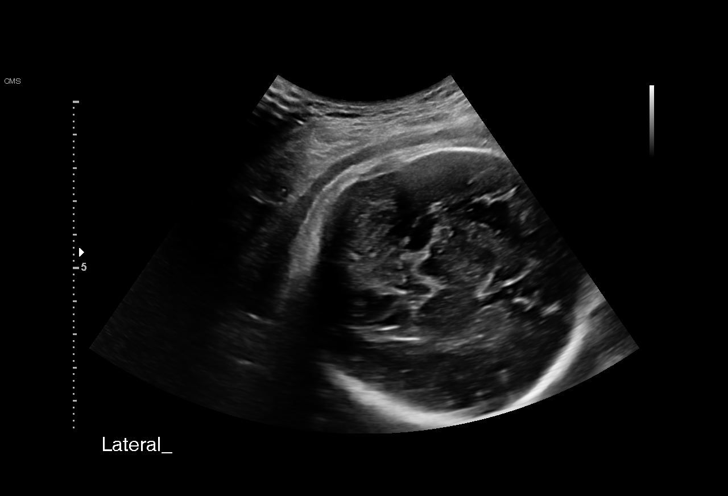
[im 48/65]
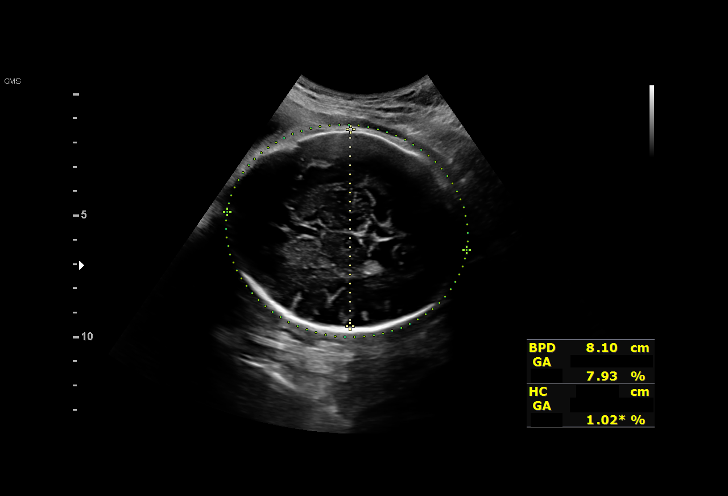
[im 53/65]
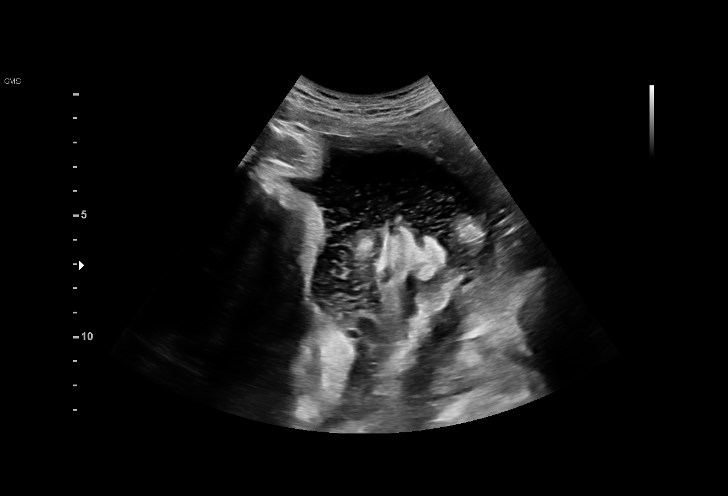
[im 57/65]
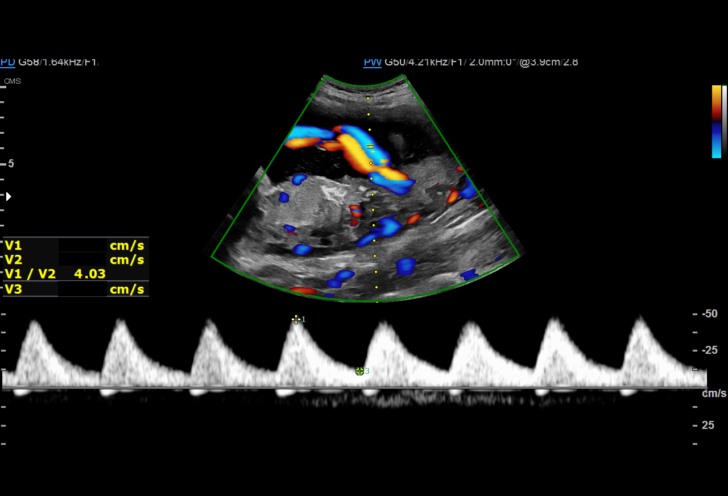
[im 62/65]
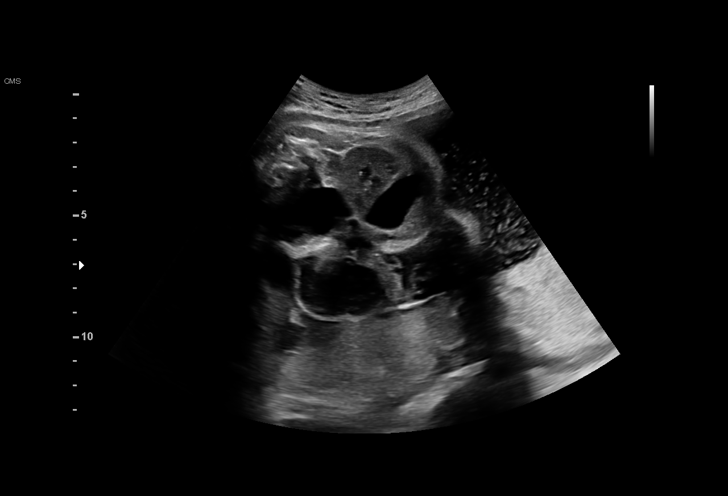

[13 of 28 positions shown; findings below may reference images not displayed]

[REDACTED]. [HOSPITAL],
                   NAZARETH CNM

 ----------------------------------------------------------------------

 ----------------------------------------------------------------------
Indications

  Encounter for other antenatal screening
  follow-up
  Abnormal biochemical screen (elevated AFP)
  Gastroschisis
  Maternal care for known or suspected poor
  fetal growth, third trimester, not applicable or
  unspecified
  34 weeks gestation of pregnancy
 ----------------------------------------------------------------------
Vital Signs

                                                Height:        5'3"
Fetal Evaluation

 Num Of Fetuses:         1
 Fetal Heart Rate(bpm):  151
 Cardiac Activity:       Observed
 Presentation:           Cephalic
 Placenta:               Posterior
 P. Cord Insertion:      Previously Visualized

 Amniotic Fluid
 AFI FV:      Within normal limits

 AFI Sum(cm)     %Tile       Largest Pocket(cm)
 16.29           59
 RUQ(cm)       RLQ(cm)       LUQ(cm)        LLQ(cm)

Biophysical Evaluation

 Amniotic F.V:   Within normal limits       F. Tone:        Observed
 F. Movement:    Observed                   Score:          [DATE]
 F. Breathing:   Observed
Biometry

 BPD:        81  mm     G. Age:  32w 4d          8  %    CI:        76.14   %    70 - 86
                                                         FL/HC:      19.7   %    19.4 -
 HC:      294.2  mm     G. Age:  32w 4d        < 3  %    HC/AC:      1.10        0.96 -
 AC:      267.8  mm     G. Age:  30w 6d        < 3  %    FL/BPD:     71.6   %    71 - 87
 FL:         58  mm     G. Age:  30w 2d        < 3  %    FL/AC:      21.7   %    20 - 24
 HUM:      51.3  mm     G. Age:  30w 0d        < 5  %

 Est. FW:    9837  gm    3 lb 11 oz    < 10  %
OB History

 Gravidity:    2         Term:   1        Prem:   0        SAB:   0
 TOP:          0       Ectopic:  0        Living: 1
Gestational Age

 LMP:           34w 2d        Date:  01/29/18                 EDD:   11/05/18
 U/S Today:     31w 4d                                        EDD:   11/24/18
 Best:          34w 2d     Det. By:  LMP  (01/29/18)          EDD:   11/05/18
Anatomy

 Cranium:               Appears normal         Aortic Arch:            Previously seen
 Cavum:                 Appears normal         Ductal Arch:            Previously seen
 Ventricles:            Appears normal         Diaphragm:              Appears normal
 Choroid Plexus:        Previously seen        Stomach:                Appears normal, left
                                                                       sided
 Cerebellum:            Appears normal         Abdomen:                Umbilical vein
                                                                       varix (1.35 cm)
 Posterior Fossa:       Previously seen        Abdominal Wall:         Gastroschisis
 Nuchal Fold:           Previously seen        Cord Vessels:           Previously seen
 Face:                  Orbits and profile     Kidneys:                Appear normal
                        previously seen
 Lips:                  Previously seen        Bladder:                Appears normal
 Thoracic:              Appears normal         Spine:                  Previously seen
 Heart:                 Appears normal         Upper Extremities:      Previously seen
                        (4CH, axis, and situs
 RVOT:                  Previously seen        Lower Extremities:      Previously seen
 LVOT:                  Previously seen

 Other:  Heels and 5th digit previously seen. Nasal bone previously seen.
Doppler - Fetal Vessels

 Umbilical Artery
  S/D     %tile                                            ADFV    RDFV
    4    > 97.5                                               No      No
Cervix Uterus Adnexa

 Cervix
 Not visualized (advanced GA >71wks)

 Uterus
 No abnormality visualized.

 Left Ovary
 Not visualized.

 Right Ovary
 Within normal limits.

 Cul De Sac
 No free fluid seen.

 Adnexa
 No abnormality visualized.
Impression

 Gastroschisis. Patient returned for fetal growth assessment
 and antenatal testing.
 The estimated fetal weight is at less than the 10th percentile.
 Head circumference measurement is at between -1 and -2
 SD (not consistent with microcephaly). Amniotic fluid is
 normal and good fetal activity is seen. Antenatal testing is
 reassuring. BPP [DATE]. Umbilical artery Doppler showed
 increased pulsatility index.
 Fetal bowel dilation is seen. Umbilical vein varix is seen (13
 millimeters).
 I explained fetal growth restriction and abnormal Doppler
 studies. Umbilical vein varix is not an indication for early
 delivery. Fetal growth restriction with increased pulsatility
 index on umbilical artery Doppler carries a higher perinatal
 mortality and delivery is recommended at 37 weeks.
 I also recommended increasing fetal surveillance to twice-
 weekly BPP/NST and Doppler studies and we made
 appointments.

Recommendations

 -BPP+NST+Doppler twice-weekly till delivery.
                 RTOYOTA

## 2019-08-25 IMAGING — US US MFM UA CORD DOPPLER
1 series · 15 of 28 positions shown · non-contrast
Comparison: none

[Series 1: us mfm ua cord doppler · 35 acquisitions, 15 frames shown]
[im 1/35]
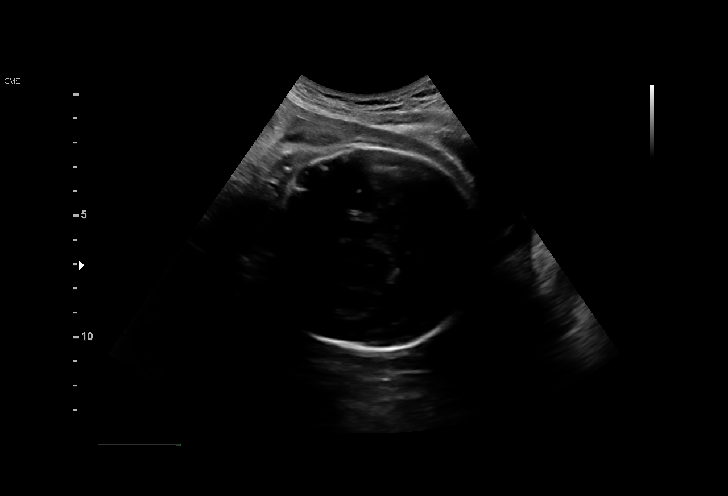
[im 3/35]
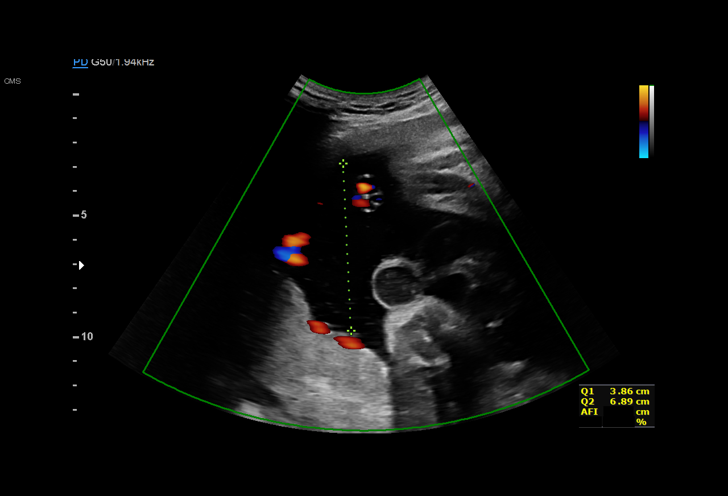
[im 6/35]
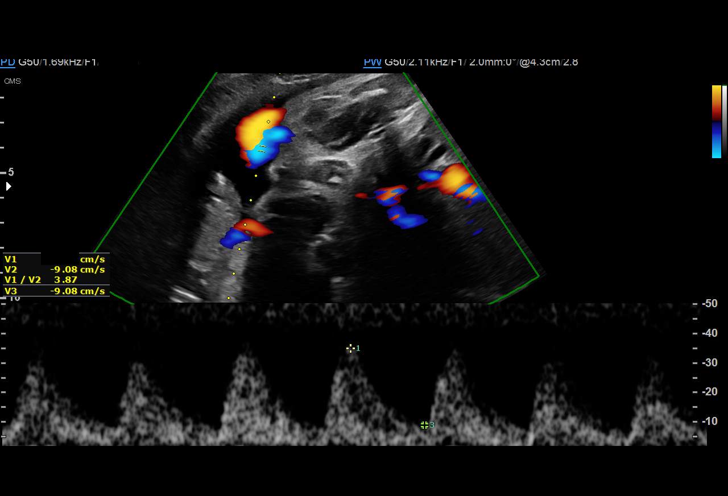
[im 8/35]
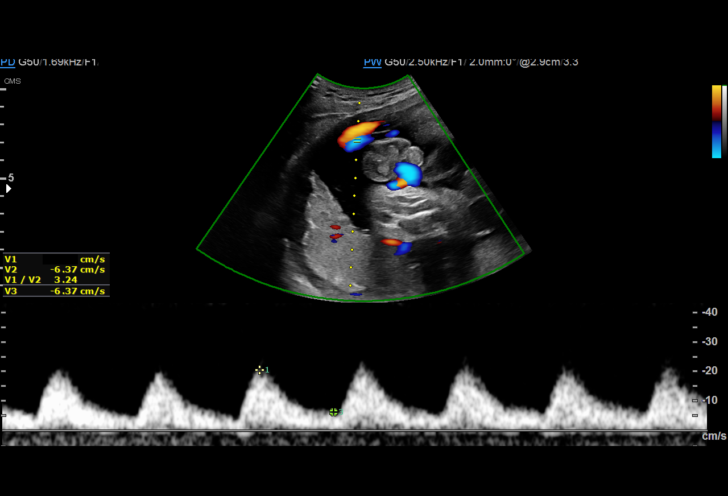
[im 11/35]
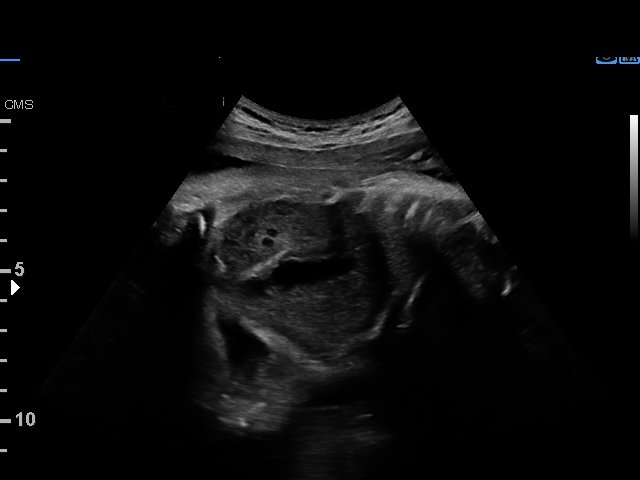
[im 13/35]
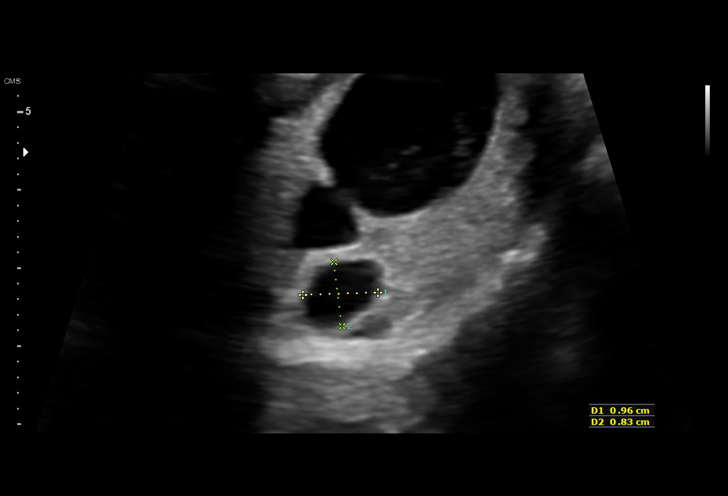
[im 16/35]
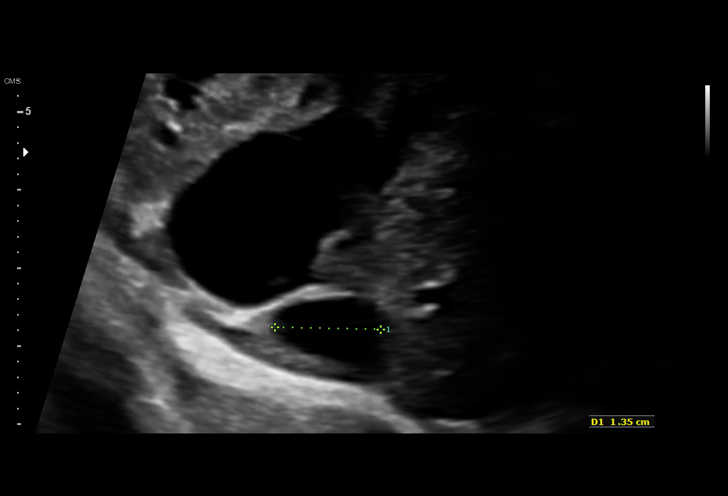
[im 18/35]
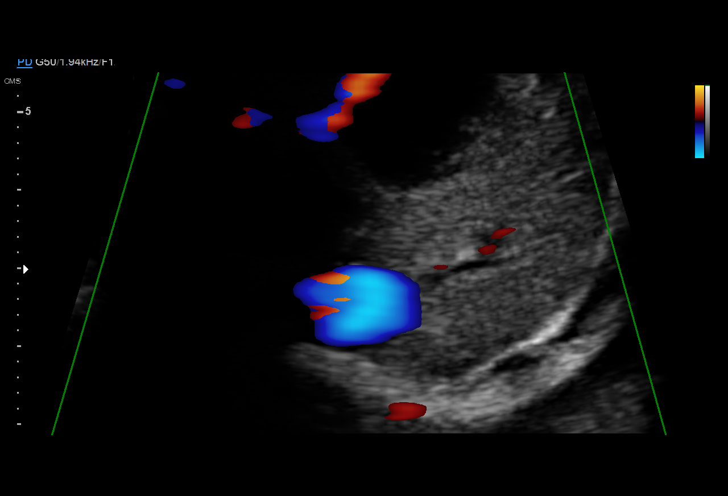
[im 19/35]
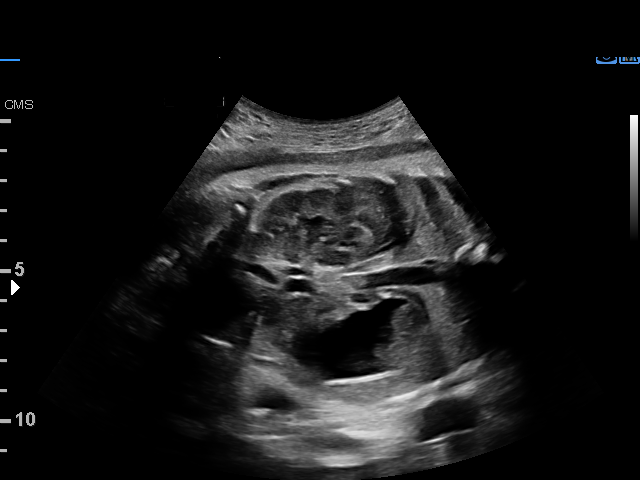
[im 22/35]
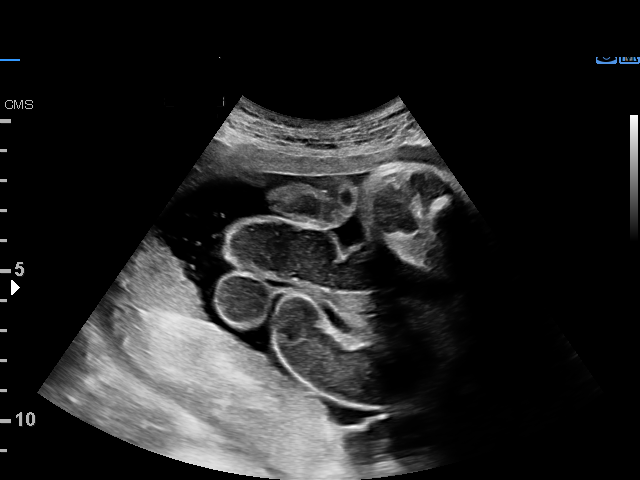
[im 24/35]
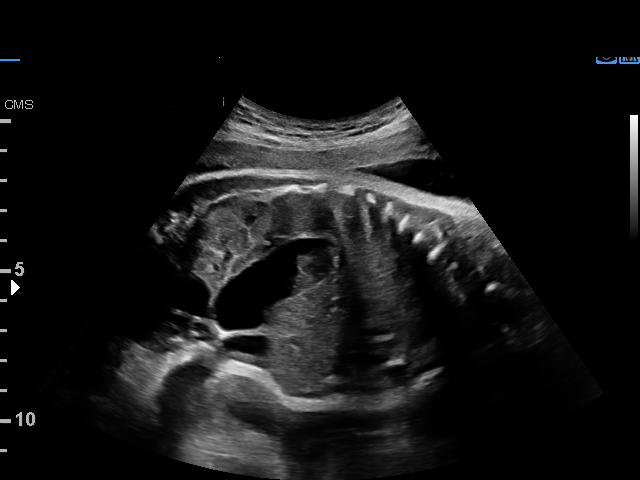
[im 27/35]
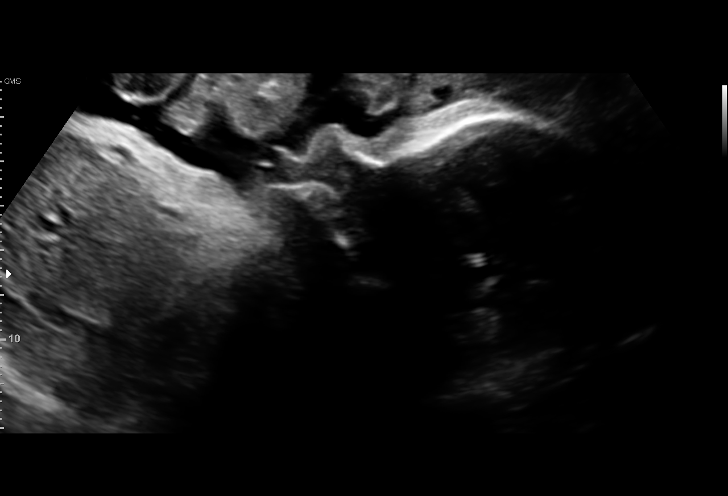
[im 29/35]
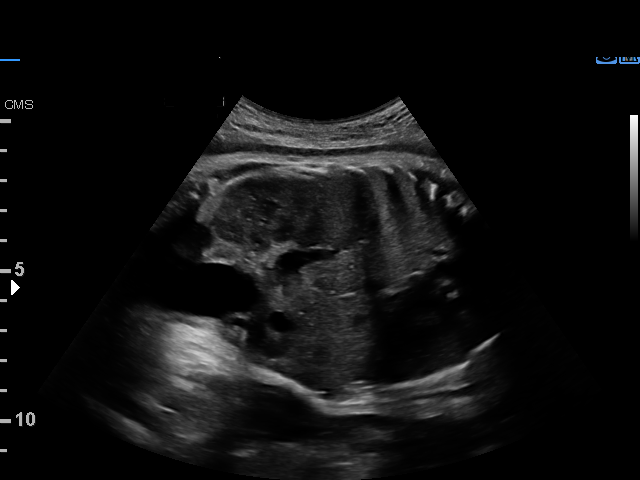
[im 32/35]
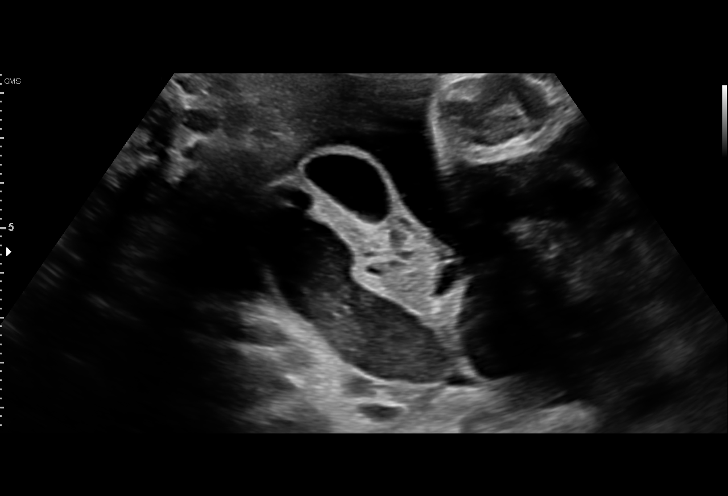
[im 35/35]
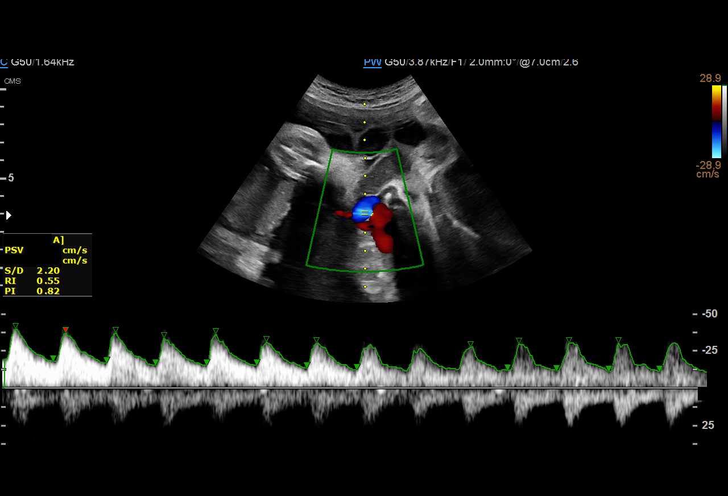

[15 of 28 positions shown; findings below may reference images not displayed]

[REDACTED]. [HOSPITAL],
                   BASTOLA CNM

     W/NONSTRESS
 ----------------------------------------------------------------------

 ----------------------------------------------------------------------
Indications

  Encounter for other antenatal screening
  follow-up
  Abnormal biochemical screen (elevated AFP)
  Gastroschisis
  Maternal care for known or suspected poor
  fetal growth, third trimester, not applicable or
  unspecified
  34 weeks gestation of pregnancy
 ----------------------------------------------------------------------
Vital Signs

                                                Height:        5'3"
Fetal Evaluation

 Num Of Fetuses:         1
 Fetal Heart Rate(bpm):  145
 Cardiac Activity:       Observed
 Presentation:           Cephalic

 Amniotic Fluid
 AFI FV:      Within normal limits

 AFI Sum(cm)     %Tile       Largest Pocket(cm)
 19.39           72

 RUQ(cm)       RLQ(cm)       LUQ(cm)        LLQ(cm)

Biophysical Evaluation

 Amniotic F.V:   Within normal limits       F. Tone:        Observed
 F. Movement:    Observed                   Score:          [DATE]
 F. Breathing:   Observed
OB History

 Gravidity:    2         Term:   1        Prem:   0        SAB:   0
 TOP:          0       Ectopic:  0        Living: 1
Gestational Age

 LMP:           34w 5d        Date:  01/29/18                 EDD:   11/05/18
 Best:          34w 5d     Det. By:  LMP  (01/29/18)          EDD:   11/05/18
Anatomy

 Abdomen:               Umbilical vein         Abdominal Wall:         Gastroschisis
                        varix
Doppler - Fetal Vessels

 Umbilical Artery
  S/D     %tile                                            ADFV    RDFV
  3.7       95                                                No      No

Impression

 Gastroschisis with fetal growth restriction.

 Amniotic fluid is normal and good fetal activity is seen. Fetal
 bowel dilation is seen (17 mm), but no increased bowel wall
 thickness is seen. Antenatal testing is reassuring. Umbilical
 artery Doppler showed pulsatility index at the 95th percentile.
 Antenatal testing is reassuring. NST is reactive. BPP [DATE].

 Bowel dilation does not predict the need for postnatal
 surgery. Patient is aware that about 10% of fetuses may
 require surgery.
Recommendations

 -Continue twice-weekly antenatal testing.
                 MLADIZUPE

## 2019-09-22 ENCOUNTER — Other Ambulatory Visit: Payer: Self-pay

## 2019-09-22 DIAGNOSIS — Z20822 Contact with and (suspected) exposure to covid-19: Secondary | ICD-10-CM

## 2019-09-25 LAB — NOVEL CORONAVIRUS, NAA: SARS-CoV-2, NAA: NOT DETECTED

## 2019-11-13 ENCOUNTER — Ambulatory Visit (HOSPITAL_COMMUNITY)
Admission: EM | Admit: 2019-11-13 | Discharge: 2019-11-13 | Disposition: A | Discharge status: Home / Self Care | Payer: Self-pay | Attending: Family Medicine | Admitting: Family Medicine

## 2019-11-13 ENCOUNTER — Other Ambulatory Visit: Payer: Self-pay

## 2019-11-13 ENCOUNTER — Encounter (HOSPITAL_COMMUNITY): Payer: Self-pay

## 2019-11-13 DIAGNOSIS — N760 Acute vaginitis: Secondary | ICD-10-CM

## 2019-11-13 LAB — POCT URINALYSIS DIP (DEVICE)
Bilirubin Urine: NEGATIVE
Glucose, UA: NEGATIVE mg/dL
Ketones, ur: NEGATIVE mg/dL
Nitrite: NEGATIVE
Protein, ur: NEGATIVE mg/dL
Specific Gravity, Urine: 1.02 (ref 1.005–1.030)
Urobilinogen, UA: 0.2 mg/dL (ref 0.0–1.0)
pH: 7 (ref 5.0–8.0)

## 2019-11-13 LAB — POC URINE PREG, ED
Preg Test, Ur: NEGATIVE
Preg Test, Ur: NEGATIVE

## 2019-11-13 LAB — POCT PREGNANCY, URINE: Preg Test, Ur: NEGATIVE

## 2019-11-13 MED ORDER — METRONIDAZOLE 500 MG PO TABS: 500.0000 mg | ORAL_TABLET | Freq: Two times a day (BID) | ORAL | 0 refills | Status: AC

## 2019-11-13 NOTE — ED Triage Notes (Addendum)
Pt presents to UC for STD test. Pt states having white vaginal discharge. burning sensation when urinating and  lower abdominal pain x 1 week.

## 2019-11-13 NOTE — Discharge Instructions (Addendum)
Begin metronidazole twice daily for the next week.  No alcohol while taking this medicine.  You may try probiotics or daily activity and to help prevent BV, please read attached for possible triggers  We are testing you for Gonorrhea, Chlamydia, Trichomonas, Yeast and Bacterial Vaginosis. We will call you if anything is positive and let you know if you require any further treatment. Please inform partners of any positive results.   Please return if symptoms not improving with treatment, development of fever, nausea, vomiting, abdominal pain.

## 2019-11-15 ENCOUNTER — Telehealth (HOSPITAL_COMMUNITY): Payer: Self-pay | Admitting: Emergency Medicine

## 2019-11-15 LAB — URINE CULTURE: Culture: >=100000 — AB

## 2019-11-15 MED ORDER — CEPHALEXIN 500 MG PO CAPS: 500.0000 mg | ORAL_CAPSULE | Freq: Two times a day (BID) | ORAL | 0 refills | Status: AC

## 2019-11-15 NOTE — Telephone Encounter (Signed)
Urine culture positive, Keflex 500 BID x 1 week sent to [facility] [address]. Attempted to call, left voicemail to return call for results.  Vaginal swab was not ordered in error. Patient should return for repeat swab for STD screening at no charge to send off. Order placed.

## 2019-11-15 NOTE — ED Provider Notes (Signed)
[facility]    CSN: [id number] Arrival date & time: 11/13/19  1508      History   Chief Complaint Chief Complaint  Patient presents with  . Appointment    1500  . STD test  . Dysuria    HPI Monica [name] is a 24 y.o. female history of prior appendectomy presenting today for evaluation of discharge and STD screening.  Patient states that over the past week she has had some lower abdominal pain.  She has also noticed a white discharge with associated itching burning and fishy odor.  She has had some dysuria, but denies urinary frequency or urgency.  She denies any nausea or vomiting or fevers.  Last menstrual cycle was 12/22.  She is not on any form of birth control.  Does report history of BV.  HPI  Past Medical History:  Diagnosis Date  . Medical history non-contributory     Patient Active Problem List   Diagnosis Date Noted  . Fetal gastroschisis during pregnancy, antepartum 06/09/2018    Past Surgical History:  Procedure Laterality Date  . APPENDECTOMY      OB History    Gravida  3   Para  2   Term  1   Preterm  1   AB  0   Living  2     SAB  0   TAB  0   Ectopic  0   Multiple  0   Live Births  2            Home Medications    Prior to Admission medications   Medication Sig Start Date End Date Taking? Authorizing Provider  metroNIDAZOLE (FLAGYL) 500 MG tablet Take 1 tablet (500 mg total) by mouth 2 (two) times daily for 7 days. 11/13/19 11/20/19  Karman Biswell, Elesa Hacker, PA-C    Family History Family History  Problem Relation Age of Onset  . Diabetes Maternal Grandfather   . Healthy Mother   . Healthy Father     Social History Social History   Tobacco Use  . Smoking status: Former Smoker    Quit date: 01/07/2016    Years since quitting: 3.8  . Smokeless tobacco: Never Used  Substance Use Topics  . Alcohol use: No  . Drug use: No     Allergies   Patient has no known allergies.   Review of Systems Review  of Systems  Constitutional: Negative for fever.  Respiratory: Negative for shortness of breath.   Cardiovascular: Negative for chest pain.  Gastrointestinal: Positive for abdominal pain. Negative for diarrhea, nausea and vomiting.  Genitourinary: Positive for dysuria and vaginal discharge. Negative for flank pain, genital sores, hematuria, menstrual problem, vaginal bleeding and vaginal pain.  Musculoskeletal: Negative for back pain.  Skin: Negative for rash.  Neurological: Negative for dizziness, light-headedness and headaches.     Physical Exam Triage Vital Signs ED Triage Vitals  Enc Vitals Group     BP 11/13/19 1536 109/70     Pulse Rate 11/13/19 1536 62     Resp 11/13/19 1536 14     Temp 11/13/19 1536 98.4 F (36.9 C)     Temp Source 11/13/19 1536 Oral     SpO2 11/13/19 1536 99 %     Weight --      Height --      Head Circumference --      Peak Flow --      Pain Score 11/13/19 1534 2  Pain Loc --      Pain Edu? --      Excl. in [place]? --    No data found.  Updated Vital Signs BP 109/70 (BP Location: Right Arm)   Pulse 62   Temp 98.4 F (36.9 C) (Oral)   Resp 14   LMP 10/31/2019 (Exact Date)   SpO2 99%   Breastfeeding Unknown   Visual Acuity Right Eye Distance:   Left Eye Distance:   Bilateral Distance:    Right Eye Near:   Left Eye Near:    Bilateral Near:     Physical Exam Vitals and nursing note reviewed.  Constitutional:      Appearance: She is well-developed.     Comments: No acute distress  HENT:     Head: Normocephalic and atraumatic.     Nose: Nose normal.  Eyes:     Conjunctiva/sclera: Conjunctivae normal.  Cardiovascular:     Rate and Rhythm: Normal rate.  Pulmonary:     Effort: Pulmonary effort is normal. No respiratory distress.  Abdominal:     General: There is no distension.     Comments: Mild tenderness to palpation of bilateral lower quadrants, no focal tenderness, negative rebound, negative Rovsing, negative McBurney's    Musculoskeletal:        General: Normal range of motion.     Cervical back: Neck supple.  Skin:    General: Skin is warm and dry.  Neurological:     Mental Status: She is alert and oriented to person, place, and time.      UC Treatments / Results  Labs (all labs ordered are listed, but only abnormal results are displayed) Labs Reviewed  URINE CULTURE - Abnormal; Notable for the following components:      Result Value   Culture >=100,000 COLONIES/mL ESCHERICHIA COLI (*)    Organism ID, Bacteria ESCHERICHIA COLI (*)    All other components within normal limits  POCT URINALYSIS DIP (DEVICE) - Abnormal; Notable for the following components:   Hgb urine dipstick TRACE (*)    Leukocytes,Ua TRACE (*)    All other components within normal limits  POCT PREGNANCY, URINE  POC URINE PREG, ED  POC URINE PREG, ED    EKG   Radiology No results found.  Procedures Procedures (including critical care time)  Medications Ordered in UC Medications - No data to display  Initial Impression / Assessment and Plan / UC Course  I have reviewed the triage vital signs and the nursing notes.  Pertinent labs & imaging results that were available during my care of the patient were reviewed by me and considered in my medical decision making (see chart for details).     Trace leuks on UA, will send for culture.  Deferring empiric treatment for UTI today.  Symptoms more suggestive of bacterial vaginosis at this time and will treat as such with metronidazole twice daily x1 week.  Vaginal swab pending to screen for STDs.  Will call with results and provide further treatment as needed.  Discussed strict return precautions. Patient verbalized understanding and is agreeable with plan.  Final Clinical Impressions(s) / UC Diagnoses   Final diagnoses:  Vaginitis and vulvovaginitis     Discharge Instructions     Begin metronidazole twice daily for the next week.  No alcohol while taking this  medicine.  You may try probiotics or daily activity and to help prevent BV, please read attached for possible triggers  We are testing you for Gonorrhea, Chlamydia,  Trichomonas, Yeast and Bacterial Vaginosis. We will call you if anything is positive and let you know if you require any further treatment. Please inform partners of any positive results.   Please return if symptoms not improving with treatment, development of fever, nausea, vomiting, abdominal pain.    ED Prescriptions    Medication Sig Dispense Auth. Provider   metroNIDAZOLE (FLAGYL) 500 MG tablet Take 1 tablet (500 mg total) by mouth 2 (two) times daily for 7 days. 14 tablet Jacier Gladu, [place] C, PA-C     PDMP not reviewed this encounter.   Lew Dawes, PA-C 11/15/19 1147

## 2019-11-15 NOTE — Telephone Encounter (Signed)
Patient returned call, informed of new rx at pharmacy for UTI. Finish flagyl course. Patient to return for repeat swab for STD screening, does not need separate visit/charge. Order placed under previous telephone note.

## 2019-12-18 ENCOUNTER — Encounter (HOSPITAL_COMMUNITY): Payer: Self-pay

## 2019-12-18 ENCOUNTER — Ambulatory Visit (HOSPITAL_COMMUNITY)
Admission: EM | Admit: 2019-12-18 | Discharge: 2019-12-18 | Disposition: A | Discharge status: Home / Self Care | Payer: Self-pay | Attending: Family Medicine | Admitting: Family Medicine

## 2019-12-18 DIAGNOSIS — N898 Other specified noninflammatory disorders of vagina: Secondary | ICD-10-CM | POA: Insufficient documentation

## 2019-12-18 DIAGNOSIS — Z3201 Encounter for pregnancy test, result positive: Secondary | ICD-10-CM

## 2019-12-18 LAB — POCT URINALYSIS DIP (DEVICE)
Bilirubin Urine: NEGATIVE
Glucose, UA: NEGATIVE mg/dL
Ketones, ur: NEGATIVE mg/dL
Leukocytes,Ua: NEGATIVE
Nitrite: NEGATIVE
Protein, ur: NEGATIVE mg/dL
Specific Gravity, Urine: >=1.03 (ref 1.005–1.030)
Urobilinogen, UA: 0.2 mg/dL (ref 0.0–1.0)
pH: 6 (ref 5.0–8.0)

## 2019-12-18 LAB — POCT PREGNANCY, URINE: Preg Test, Ur: POSITIVE — AB

## 2019-12-18 LAB — POC URINE PREG, ED: Preg Test, Ur: POSITIVE — AB

## 2019-12-18 MED ORDER — MICONAZOLE NITRATE 200 MG VA SUPP: 200.0000 mg | Freq: Every day | VAGINAL | 0 refills | Status: DC

## 2019-12-18 MED ORDER — NYSTATIN 100000 UNIT/GM EX CREA: TOPICAL_CREAM | CUTANEOUS | 0 refills | Status: DC

## 2019-12-18 NOTE — ED Triage Notes (Signed)
Patient presents to Urgent Care with complaints of continued vaginal discharge since a month ago. Patient reports she would like STD testing because the swab got lost after her last visit a month ago.

## 2019-12-18 NOTE — Discharge Instructions (Addendum)
Treating you for yeast infection.  Use the medication as prescribed Swab sent for testing with labs pending.

## 2019-12-18 NOTE — ED Provider Notes (Signed)
[facility]    CSN: [id number] Arrival date & time: 12/18/19  1339      History   Chief Complaint Chief Complaint  Patient presents with  . Appointment    1330  . Vaginal Discharge    HPI Monica [name] is a 24 y.o. female.   Pt is a 24 year old female that presents with vaginal discharge x 1 month. Symptoms have been constant. Describes as white, thick and odorous. She was treated for BV and UTI a  Month ago. She completed this treatment but the discharge never went away. LMP was in December and then she had some light spotting in January. No abdominal pain, back pain, fever. No urinary symptoms or bleeding.   ROS per HPI      Past Medical History:  Diagnosis Date  . Medical history non-contributory     Patient Active Problem List   Diagnosis Date Noted  . Fetal gastroschisis during pregnancy, antepartum 06/09/2018    Past Surgical History:  Procedure Laterality Date  . APPENDECTOMY      OB History    Gravida  3   Para  2   Term  1   Preterm  1   AB  0   Living  2     SAB  0   TAB  0   Ectopic  0   Multiple  0   Live Births  2            Home Medications    Prior to Admission medications   Medication Sig Start Date End Date Taking? Authorizing Provider  miconazole (MICOTIN) 200 MG vaginal suppository Place 1 suppository (200 mg total) vaginally at bedtime. 12/18/19   Loura Halt A, NP  nystatin cream (MYCOSTATIN) Apply to affected area 2 times daily 12/18/19   Orvan July, NP    Family History Family History  Problem Relation Age of Onset  . Diabetes Maternal Grandfather   . Healthy Mother   . Healthy Father     Social History Social History   Tobacco Use  . Smoking status: Former Smoker    Quit date: 01/07/2016    Years since quitting: 3.9  . Smokeless tobacco: Never Used  Substance Use Topics  . Alcohol use: No  . Drug use: No     Allergies   Patient has no known allergies.   Review of  Systems Review of Systems   Physical Exam Triage Vital Signs ED Triage Vitals  Enc Vitals Group     BP 12/18/19 1356 106/70     Pulse Rate 12/18/19 1356 70     Resp 12/18/19 1356 15     Temp 12/18/19 1356 98.4 F (36.9 C)     Temp Source 12/18/19 1356 Oral     SpO2 12/18/19 1356 97 %     Weight --      Height --      Head Circumference --      Peak Flow --      Pain Score 12/18/19 1355 0     Pain Loc --      Pain Edu? --      Excl. in [place]? --    No data found.  Updated Vital Signs BP 106/70 (BP Location: Right Arm)   Pulse 70   Temp 98.4 F (36.9 C) (Oral)   Resp 15   SpO2 97%   Visual Acuity Right Eye Distance:   Left Eye Distance:  Bilateral Distance:    Right Eye Near:   Left Eye Near:    Bilateral Near:     Physical Exam Vitals and nursing note reviewed.  Constitutional:      General: She is not in acute distress.    Appearance: Normal appearance. She is not ill-appearing, toxic-appearing or diaphoretic.  HENT:     Head: Normocephalic.     Nose: Nose normal.  Eyes:     Conjunctiva/sclera: Conjunctivae normal.  Pulmonary:     Effort: Pulmonary effort is normal.  Musculoskeletal:        General: Normal range of motion.     Cervical back: Normal range of motion.  Skin:    General: Skin is warm and dry.     Findings: No rash.  Neurological:     Mental Status: She is alert.  Psychiatric:        Mood and Affect: Mood normal.      UC Treatments / Results  Labs (all labs ordered are listed, but only abnormal results are displayed) Labs Reviewed  POC URINE PREG, ED - Abnormal; Notable for the following components:      Result Value   Preg Test, Ur POSITIVE (*)    All other components within normal limits  POCT URINALYSIS DIP (DEVICE) - Abnormal; Notable for the following components:   Hgb urine dipstick TRACE (*)    All other components within normal limits  POCT PREGNANCY, URINE - Abnormal; Notable for the following components:   Preg Test,  Ur POSITIVE (*)    All other components within normal limits  CERVICOVAGINAL ANCILLARY ONLY    EKG   Radiology No results found.  Procedures Procedures (including critical care time)  Medications Ordered in UC Medications - No data to display  Initial Impression / Assessment and Plan / UC Course  I have reviewed the triage vital signs and the nursing notes.  Pertinent labs & imaging results that were available during my care of the patient were reviewed by me and considered in my medical decision making (see chart for details).     Vaginal discharge-we will treat for yeast infection pending swab results. Patient was recently treated for BV and a urinary tract infection.  Pregnancy test positive here today.  This was a surprise for the patient. She is not having any abdominal pain or vaginal bleeding. We will have her follow-up with OB/GYN for further management  Final Clinical Impressions(s) / UC Diagnoses   Final diagnoses:  Vaginal discharge     Discharge Instructions     Treating you for yeast infection.  Use the medication as prescribed Swab sent for testing with labs pending.     ED Prescriptions    Medication Sig Dispense Auth. Provider   miconazole (MICOTIN) 200 MG vaginal suppository Place 1 suppository (200 mg total) vaginally at bedtime. 3 suppository Markela Wee A, NP   nystatin cream (MYCOSTATIN) Apply to affected area 2 times daily 15 g Annamae Shivley A, NP     PDMP not reviewed this encounter.   Dahlia Byes A, NP 12/18/19 1505

## 2019-12-20 ENCOUNTER — Telehealth (HOSPITAL_COMMUNITY): Payer: Self-pay | Admitting: Emergency Medicine

## 2019-12-20 LAB — CERVICOVAGINAL ANCILLARY ONLY
Bacterial vaginitis: POSITIVE — AB
Candida vaginitis: NEGATIVE
Chlamydia: POSITIVE — AB
Neisseria Gonorrhea: NEGATIVE
Trichomonas: NEGATIVE

## 2019-12-20 MED ORDER — AZITHROMYCIN 250 MG PO TABS: 1000.0000 mg | ORAL_TABLET | Freq: Once | ORAL | 0 refills | Status: AC

## 2019-12-20 MED ORDER — CLINDAMYCIN HCL 300 MG PO CAPS: 300.0000 mg | ORAL_CAPSULE | Freq: Two times a day (BID) | ORAL | 0 refills | Status: AC

## 2019-12-20 NOTE — Telephone Encounter (Signed)
Chlamydia is positive.  Rx po zithromax 1g #1 dose no refills was sent to the pharmacy of record.  Please refrain from sexual intercourse for 7 days to give the medicine time to work, sexual partners need to be notified and tested/treated.  Condoms may reduce risk of reinfection.  Recheck or followup with PCP for further evaluation if symptoms are not improving.   [facility] notified.  Bacterial vaginosis is positive. Pt needs treatment. Clindamycin 300mg  BID x 7 days #14 no refills sent to patients pharmacy of choice.    Patient contacted by phone and made aware of    results. Pt verbalized understanding and had all questions answered.

## 2020-03-28 ENCOUNTER — Ambulatory Visit (HOSPITAL_COMMUNITY)
Admission: EM | Admit: 2020-03-28 | Discharge: 2020-03-28 | Disposition: A | Discharge status: Home / Self Care | Payer: Self-pay | Attending: Emergency Medicine | Admitting: Emergency Medicine

## 2020-03-28 ENCOUNTER — Other Ambulatory Visit: Payer: Self-pay

## 2020-03-28 ENCOUNTER — Encounter (HOSPITAL_COMMUNITY): Payer: Self-pay

## 2020-03-28 DIAGNOSIS — Z3202 Encounter for pregnancy test, result negative: Secondary | ICD-10-CM

## 2020-03-28 DIAGNOSIS — N898 Other specified noninflammatory disorders of vagina: Secondary | ICD-10-CM

## 2020-03-28 DIAGNOSIS — Z113 Encounter for screening for infections with a predominantly sexual mode of transmission: Secondary | ICD-10-CM

## 2020-03-28 DIAGNOSIS — R102 Pelvic and perineal pain: Secondary | ICD-10-CM

## 2020-03-28 DIAGNOSIS — R109 Unspecified abdominal pain: Secondary | ICD-10-CM

## 2020-03-28 DIAGNOSIS — R300 Dysuria: Secondary | ICD-10-CM

## 2020-03-28 LAB — HIV ANTIBODY (ROUTINE TESTING W REFLEX): HIV Screen 4th Generation wRfx: NONREACTIVE

## 2020-03-28 LAB — POCT URINALYSIS DIP (DEVICE)
Bilirubin Urine: NEGATIVE
Glucose, UA: NEGATIVE mg/dL
Hgb urine dipstick: NEGATIVE
Ketones, ur: NEGATIVE mg/dL
Leukocytes,Ua: NEGATIVE
Nitrite: NEGATIVE
Protein, ur: NEGATIVE mg/dL
Specific Gravity, Urine: 1.025 (ref 1.005–1.030)
Urobilinogen, UA: 0.2 mg/dL (ref 0.0–1.0)
pH: 7 (ref 5.0–8.0)

## 2020-03-28 LAB — POC URINE PREG, ED: Preg Test, Ur: NEGATIVE

## 2020-03-28 MED ORDER — LIDOCAINE HCL (PF) 1 % IJ SOLN
INTRAMUSCULAR | Status: AC
  Filled 2020-03-28: qty 2

## 2020-03-28 MED ORDER — DOXYCYCLINE HYCLATE 100 MG PO CAPS: 100.0000 mg | ORAL_CAPSULE | Freq: Two times a day (BID) | ORAL | 0 refills | Status: AC

## 2020-03-28 MED ORDER — METRONIDAZOLE 500 MG PO TABS: 500.0000 mg | ORAL_TABLET | Freq: Two times a day (BID) | ORAL | 0 refills | Status: AC

## 2020-03-28 MED ORDER — CEFTRIAXONE SODIUM 500 MG IJ SOLR
500.0000 mg | Freq: Once | INTRAMUSCULAR | Status: AC
  Administered 2020-03-28: 500 mg via INTRAMUSCULAR

## 2020-03-28 MED ORDER — IBUPROFEN 600 MG PO TABS: 600.0000 mg | ORAL_TABLET | Freq: Four times a day (QID) | ORAL | 0 refills | Status: DC | PRN

## 2020-03-28 MED ORDER — CEFTRIAXONE SODIUM 500 MG IJ SOLR
INTRAMUSCULAR | Status: AC
  Filled 2020-03-28: qty 500

## 2020-03-28 NOTE — ED Triage Notes (Signed)
Pt is here with lower abdominal pain, vaginal discharge, dysuria that started a week ago. Pt last had unprotected sex 2 weeks ago with the same partner for 3 years.

## 2020-03-28 NOTE — ED Provider Notes (Signed)
HPI  SUBJECTIVE:  Monica [name] is a 24 y.o. female who presents with 1 week of lower abdominal cramping pain, odorous yellow vaginal discharge.  The pain is intermittent, lasting minutes.  It does not migrate or radiate.  She reports dysuria, vaginal itching.  No urinary urgency, frequency, cloudy or odorous urine, hematuria.  No genital rash, blisters, labial swelling.  No fevers, abdominal, back pain.  No recent antibiotics, perfumed soaps or body washes.  She is in a long-term sexual relationship with a female who she suspects he may have another partner.  He has given her chlamydia in the past.  She denies having another partner.  STDs are a concern today.  There are no aggravating or alleviating factors.  She has not tried anything for this.  She has a past medical history of chlamydia and states this feels similar to that.  She also has a history of frequent yeast infections.  Past medical history negative for diabetes, hypertension, gonorrhea, HSV, HIV, syphilis, trichomonas, BV, PID, ovarian cyst, PCOS, ectopic pregnancies.  LMP: 5/2.  She denies the possibility of being pregnant  Past Medical History:  Diagnosis Date  . Medical history non-contributory     Past Surgical History:  Procedure Laterality Date  . APPENDECTOMY      Family History  Problem Relation Age of Onset  . Diabetes Maternal Grandfather   . Healthy Mother   . Healthy Father     Social History   Tobacco Use  . Smoking status: Current Some Day Smoker    Types: Cigarettes  . Smokeless tobacco: Never Used  Substance Use Topics  . Alcohol use: Yes  . Drug use: Not on file    No current facility-administered medications for this encounter.  Current Outpatient Medications:  .  doxycycline (VIBRAMYCIN) 100 MG capsule, Take 1 capsule (100 mg total) by mouth 2 (two) times daily for 7 days., Disp: 14 capsule, Rfl: 0 .  ibuprofen (ADVIL) 600 MG tablet, Take 1 tablet (600 mg total) by mouth every 6 (six)  hours as needed., Disp: 30 tablet, Rfl: 0 .  metroNIDAZOLE (FLAGYL) 500 MG tablet, Take 1 tablet (500 mg total) by mouth 2 (two) times daily for 7 days., Disp: 14 tablet, Rfl: 0  No Known Allergies   ROS  As noted in HPI.   Physical Exam  BP (!) 91/58 (BP Location: Right Arm)   Pulse 63   Temp 98.7 F (37.1 C) (Oral)   Resp 17   Wt 59 kg   LMP 03/11/2020   SpO2 99%   Breastfeeding No   BMI 22.31 kg/m   Constitutional: Well developed, well nourished, no acute distress Eyes:  EOMI, conjunctiva normal bilaterally HENT: Normocephalic, atraumatic,mucus membranes moist Respiratory: Normal inspiratory effort Cardiovascular: Normal rate GI: nondistended soft, nontender. + suprapubic tenderness  back: No CVA tenderness GU: External genitalia normal.  Normal vaginal mucosa.  Normal os. Thin oderous  white vaginal discharge.   Uterus smooth, tender . No  CMT. No  adnexal tenderness. No adnexal masses.  Chaperone present during exam skin: No rash, skin intact Musculoskeletal: no deformities Neurologic: Alert & oriented x 3, no focal neuro deficits Psychiatric: Speech and behavior appropriate   ED Course   Medications  cefTRIAXone (ROCEPHIN) injection 500 mg (500 mg Intramuscular Given 03/28/20 1915)    Orders Placed This Encounter  Procedures  . HIV antibody    Standing Status:   Standing    Number of Occurrences:   1  .  POC urine pregnancy    Standing Status:   Standing    Number of Occurrences:   1  . POCT urinalysis dip (device)    Standing Status:   Standing    Number of Occurrences:   1    Results for orders placed or performed during the hospital encounter of 03/28/20 (from the past 24 hour(s))  POCT urinalysis dip (device)     Status: None   Collection Time: 03/28/20  6:05 PM  Result Value Ref Range   Glucose, UA NEGATIVE NEGATIVE mg/dL   Bilirubin Urine NEGATIVE NEGATIVE   Ketones, ur NEGATIVE NEGATIVE mg/dL   Specific Gravity, Urine 1.025 1.005 - 1.030    Hgb urine dipstick NEGATIVE NEGATIVE   pH 7.0 5.0 - 8.0   Protein, ur NEGATIVE NEGATIVE mg/dL   Urobilinogen, UA 0.2 0.0 - 1.0 mg/dL   Nitrite NEGATIVE NEGATIVE   Leukocytes,Ua NEGATIVE NEGATIVE  POC urine pregnancy     Status: None   Collection Time: 03/28/20  6:09 PM  Result Value Ref Range   Preg Test, Ur NEGATIVE NEGATIVE  HIV antibody     Status: None   Collection Time: 03/28/20  6:53 PM  Result Value Ref Range   HIV Screen 4th Generation wRfx Non Reactive Non Reactive   No results found.  ED Clinical Impression  1. Pelvic pain   2. Vaginal discharge   3. Screening for STD (sexually transmitted disease)     ED Assessment/Plan  UA negative for UTI.  She is not pregnant.  H&P most c/w  BV versus chlamydia.  Sent off GC/chlamydia, wet prep, HIV, RPR. Will treat empirically for gonorrhea today per patient request.  Giving ceftriaxone 500 mg IM.  Will send home with doxycycline 100 mg p.o. twice daily for 7 days for presumed chlamydia and flagyl 500 mg p.o. twice daily for 7 days for presumed BV, ibuprofen/Tylenol combination.  Advised pt to refrain from sexual contact until she knows lab results, symptoms resolve, and partner(s) are treated if necessary. Pt provided working phone number. Follow-up with PMD of choice as needed.  Provided primary care list. discussed labs, MDM, plan and followup with patient. Pt agrees with plan.   Meds ordered this encounter  Medications  . cefTRIAXone (ROCEPHIN) injection 500 mg  . metroNIDAZOLE (FLAGYL) 500 MG tablet    Sig: Take 1 tablet (500 mg total) by mouth 2 (two) times daily for 7 days.    Dispense:  14 tablet    Refill:  0  . doxycycline (VIBRAMYCIN) 100 MG capsule    Sig: Take 1 capsule (100 mg total) by mouth 2 (two) times daily for 7 days.    Dispense:  14 capsule    Refill:  0  . ibuprofen (ADVIL) 600 MG tablet    Sig: Take 1 tablet (600 mg total) by mouth every 6 (six) hours as needed.    Dispense:  30 tablet    Refill:  0     *This clinic note was created using Scientist, clinical (histocompatibility and immunogenetics). Therefore, there may be occasional mistakes despite careful proofreading.  ?     Domenick Gong, MD 03/29/20 [phone]

## 2020-03-28 NOTE — Discharge Instructions (Addendum)
Take the medication as written. Give [place] a working phone number so that we can contact you if needed. Refrain from sexual contact until you know your results and your partner(s) are treated if necessary. Return to the ER if you get worse, have a fever >100.4, or for any concerns.   Go to www.goodrx.com to look up your medications. This will give you a list of where you can find your prescriptions at the most affordable prices. Or ask the pharmacist what the cash price is, or if they have any other discount programs available to help make your medication more affordable. This can be less expensive than what you would pay with insurance.   Below is a list of primary care practices who are taking new patients for you to follow-up with.  [facility] internal medicine clinic Ground Floor - [facility], [address] [place], [place], [place] [address] [phone]  [facility] [address] [place], [place] [address] [phone]  [facility] [address] [place], [place] [address] [phone]  [name] [phone] [address] [place] [address]  [name]: [address] [place] [address]  [phone]  [facility]: [address] [place] [address]   [phone]  [facility]: [address] [place] [address]  [phone]  [facility] : [address] [place] [address] [phone]  [facility]: [address] [place] [address] [phone]  [facility]: [address] [place] [address] [phone]  Dr. Oneal Grout [address] [facility] [place] [address]  [phone]  Dr. Jackie Plum, [facility]. [address] [place], [place] [address]  [phone]

## 2020-03-29 LAB — CERVICOVAGINAL ANCILLARY ONLY
Bacterial Vaginitis (gardnerella): POSITIVE — AB
Candida Glabrata: NEGATIVE
Candida Vaginitis: NEGATIVE
Chlamydia: NEGATIVE
Comment: NEGATIVE
Comment: NEGATIVE
Comment: NEGATIVE
Comment: NEGATIVE
Comment: NEGATIVE
Comment: NORMAL
Neisseria Gonorrhea: NEGATIVE
Trichomonas: NEGATIVE

## 2020-03-29 LAB — RPR: RPR Ser Ql: NONREACTIVE

## 2020-08-22 ENCOUNTER — Encounter (HOSPITAL_COMMUNITY): Payer: Self-pay | Admitting: Emergency Medicine

## 2020-08-22 ENCOUNTER — Other Ambulatory Visit: Payer: Self-pay

## 2020-08-22 ENCOUNTER — Ambulatory Visit (HOSPITAL_COMMUNITY)
Admission: EM | Admit: 2020-08-22 | Discharge: 2020-08-22 | Disposition: A | Discharge status: Home / Self Care | Payer: Self-pay | Attending: Urgent Care | Admitting: Urgent Care

## 2020-08-22 DIAGNOSIS — R300 Dysuria: Secondary | ICD-10-CM | POA: Insufficient documentation

## 2020-08-22 DIAGNOSIS — N3091 Cystitis, unspecified with hematuria: Secondary | ICD-10-CM | POA: Insufficient documentation

## 2020-08-22 DIAGNOSIS — Z113 Encounter for screening for infections with a predominantly sexual mode of transmission: Secondary | ICD-10-CM

## 2020-08-22 DIAGNOSIS — Z3202 Encounter for pregnancy test, result negative: Secondary | ICD-10-CM

## 2020-08-22 DIAGNOSIS — N898 Other specified noninflammatory disorders of vagina: Secondary | ICD-10-CM | POA: Insufficient documentation

## 2020-08-22 LAB — POCT URINALYSIS DIPSTICK, ED / UC
Bilirubin Urine: NEGATIVE
Glucose, UA: NEGATIVE mg/dL
Ketones, ur: NEGATIVE mg/dL
Leukocytes,Ua: NEGATIVE
Nitrite: POSITIVE — AB
Protein, ur: NEGATIVE mg/dL
Specific Gravity, Urine: 1.015 (ref 1.005–1.030)
Urobilinogen, UA: 0.2 mg/dL (ref 0.0–1.0)
pH: 5.5 (ref 5.0–8.0)

## 2020-08-22 LAB — POC URINE PREG, ED: Preg Test, Ur: NEGATIVE

## 2020-08-22 MED ORDER — METRONIDAZOLE 500 MG PO TABS: 500.0000 mg | ORAL_TABLET | Freq: Two times a day (BID) | ORAL | 0 refills | Status: DC

## 2020-08-22 MED ORDER — CEPHALEXIN 500 MG PO CAPS: 500.0000 mg | ORAL_CAPSULE | Freq: Two times a day (BID) | ORAL | 0 refills | Status: DC

## 2020-08-22 NOTE — ED Provider Notes (Signed)
[name]   MRN: [id number] DOB: 31-Jan-1996  Subjective:   Monica [name] is a 24 y.o. female presenting for several day hx of acute onset dysuria, urinary frequency, lower abdominal pain, low back pain.  She has also had vaginal discharge.  States that she has a history of BV.  Also had chlamydia in February 2021 which was treated.  She would like to make sure she does not have a sexually transmitted infection.  Denies hydrating very well.  No current facility-administered medications for this encounter.  Current Outpatient Medications:    ibuprofen (ADVIL) 600 MG tablet, Take 1 tablet (600 mg total) by mouth every 6 (six) hours as needed., Disp: 30 tablet, Rfl: 0   No Known Allergies  Past Medical History:  Diagnosis Date   Medical history non-contributory      Past Surgical History:  Procedure Laterality Date   APPENDECTOMY      Family History  Problem Relation Age of Onset   Diabetes Maternal Grandfather    Healthy Mother    Healthy Father     Social History   Tobacco Use   Smoking status: Current Some Day Smoker    Types: Cigarettes   Smokeless tobacco: Never Used  Building services engineer Use: Never used  Substance Use Topics   Alcohol use: Yes   Drug use: Never    ROS   Objective:   Vitals: BP (!) 116/56 (BP Location: Right Arm)    Pulse 78    Temp 98.1 F (36.7 C) (Oral)    Resp 16    LMP 07/31/2020    SpO2 100%   Physical Exam Constitutional:      General: She is not in acute distress.    Appearance: Normal appearance. She is well-developed and normal weight. She is not ill-appearing, toxic-appearing or diaphoretic.  HENT:     Head: Normocephalic and atraumatic.     Right Ear: External ear normal.     Left Ear: External ear normal.     Nose: Nose normal.     Mouth/Throat:     Mouth: Mucous membranes are moist.     Pharynx: Oropharynx is clear.  Eyes:     General: No scleral icterus.    Extraocular  Movements: Extraocular movements intact.     Pupils: Pupils are equal, round, and reactive to light.  Cardiovascular:     Rate and Rhythm: Normal rate and regular rhythm.     Heart sounds: Normal heart sounds. No murmur heard.  No friction rub. No gallop.   Pulmonary:     Effort: Pulmonary effort is normal. No respiratory distress.     Breath sounds: Normal breath sounds. No stridor. No wheezing, rhonchi or rales.  Abdominal:     General: Bowel sounds are normal. There is no distension.     Palpations: Abdomen is soft. There is no mass.     Tenderness: There is abdominal tenderness in the right lower quadrant and suprapubic area. There is no right CVA tenderness, left CVA tenderness, guarding or rebound.  Skin:    General: Skin is warm and dry.     Coloration: Skin is not pale.     Findings: No rash.  Neurological:     General: No focal deficit present.     Mental Status: She is alert and oriented to person, place, and time.  Psychiatric:        Mood and Affect: Mood normal.  Behavior: Behavior normal.        Thought Content: Thought content normal.        Judgment: Judgment normal.     Results for orders placed or performed during the hospital encounter of 08/22/20 (from the past 24 hour(s))  POC urine pregnancy     Status: None   Collection Time: 08/22/20  6:06 PM  Result Value Ref Range   Preg Test, Ur NEGATIVE NEGATIVE  POC Urinalysis dipstick     Status: Abnormal   Collection Time: 08/22/20  6:24 PM  Result Value Ref Range   Glucose, UA NEGATIVE NEGATIVE mg/dL   Bilirubin Urine NEGATIVE NEGATIVE   Ketones, ur NEGATIVE NEGATIVE mg/dL   Specific Gravity, Urine 1.015 1.005 - 1.030   Hgb urine dipstick TRACE (A) NEGATIVE   pH 5.5 5.0 - 8.0   Protein, ur NEGATIVE NEGATIVE mg/dL   Urobilinogen, UA 0.2 0.0 - 1.0 mg/dL   Nitrite POSITIVE (A) NEGATIVE   Leukocytes,Ua NEGATIVE NEGATIVE    Assessment and Plan :   PDMP not reviewed this encounter.  1. Cystitis with  hematuria   2. Vaginal discharge   3. Dysuria     Start Keflex to cover for acute cystitis, urine culture pending.  Recommended aggressive hydration, limiting urinary irritants.  Given her history of bacterial vaginosis, will cover for recurrent infection with Flagyl.  STI testing pending.  Low suspicion for appendicitis, PID given overall status, reassuring vital signs.  Counseled patient on potential for adverse effects with medications prescribed/recommended today, ER and return-to-clinic precautions discussed, patient verbalized understanding.   Wallis Bamberg, [place] 08/22/20 1847

## 2020-08-22 NOTE — Discharge Instructions (Signed)
Make sure you hydrate very well with plain water and a quantity of 64 ounces of water a day.  Please limit drinks that are considered urinary irritants such as soda, sweet tea, coffee, energy drinks, alcohol.  These can worsen your UTI symptoms and also be the source of them.  I will let you know about your urine culture results through MyChart to see if we need to change your antibiotics based off of those results. ° °

## 2020-08-22 NOTE — ED Triage Notes (Signed)
Burning with urination, frequency, urgency, vaginal discharge. Patient complains of lower abdomen and lower back pain.  Onset of symptoms one week ago

## 2020-08-23 LAB — CERVICOVAGINAL ANCILLARY ONLY
Bacterial Vaginitis (gardnerella): POSITIVE — AB
Candida Glabrata: NEGATIVE
Candida Vaginitis: NEGATIVE
Chlamydia: NEGATIVE
Comment: NEGATIVE
Comment: NEGATIVE
Comment: NEGATIVE
Comment: NEGATIVE
Comment: NEGATIVE
Comment: NORMAL
Neisseria Gonorrhea: NEGATIVE
Trichomonas: NEGATIVE

## 2020-08-25 LAB — URINE CULTURE: Culture: >=100000 — AB

## 2020-08-28 ENCOUNTER — Telehealth (HOSPITAL_COMMUNITY): Payer: Self-pay | Admitting: Emergency Medicine

## 2020-08-28 NOTE — Telephone Encounter (Signed)
Received notification that prescription did not go through for patient.  Verified receipt not confirmed, called and left voicemail for verbals

## 2020-10-01 ENCOUNTER — Other Ambulatory Visit: Payer: Self-pay

## 2020-10-01 DIAGNOSIS — O3680X0 Pregnancy with inconclusive fetal viability, not applicable or unspecified: Secondary | ICD-10-CM

## 2020-10-26 ENCOUNTER — Other Ambulatory Visit: Payer: Self-pay

## 2020-10-26 ENCOUNTER — Encounter (HOSPITAL_COMMUNITY): Payer: Self-pay | Admitting: Emergency Medicine

## 2020-10-26 ENCOUNTER — Ambulatory Visit (HOSPITAL_COMMUNITY)
Admission: EM | Admit: 2020-10-26 | Discharge: 2020-10-26 | Disposition: A | Discharge status: Home / Self Care | Payer: Self-pay | Attending: Family Medicine | Admitting: Family Medicine

## 2020-10-26 DIAGNOSIS — N390 Urinary tract infection, site not specified: Secondary | ICD-10-CM | POA: Insufficient documentation

## 2020-10-26 DIAGNOSIS — R1011 Right upper quadrant pain: Secondary | ICD-10-CM | POA: Insufficient documentation

## 2020-10-26 LAB — POCT URINALYSIS DIPSTICK, ED / UC
Bilirubin Urine: NEGATIVE
Glucose, UA: NEGATIVE mg/dL
Ketones, ur: NEGATIVE mg/dL
Nitrite: POSITIVE — AB
Protein, ur: NEGATIVE mg/dL
Specific Gravity, Urine: 1.025 (ref 1.005–1.030)
Urobilinogen, UA: 0.2 mg/dL (ref 0.0–1.0)
pH: 7 (ref 5.0–8.0)

## 2020-10-26 MED ORDER — CEPHALEXIN 500 MG PO CAPS: 500.0000 mg | ORAL_CAPSULE | Freq: Two times a day (BID) | ORAL | 0 refills | Status: DC

## 2020-10-26 NOTE — ED Triage Notes (Addendum)
Patient c/o foul smelling urine x 3 days.   Patient c/o RT lower ABD x 1 day.   Patient endorses increased frequency.   Patient denies abnormal vaginal discharge . Patient denies change in bowel movement.   Patient wants STD screening.

## 2020-10-26 NOTE — ED Provider Notes (Signed)
[facility]    CSN: [id number] Arrival date & time: 10/26/20  1451      History   Chief Complaint Chief Complaint  Patient presents with  . Dysuria  . STD screening     HPI Monica [name] is a 24 y.o. female.   Patient presenting today with several day history of urinary frequency and foul urine smell and now this morning having some recurrent RUQ sharp pain that she states feels like when she has had gallbladder issues in the past. States she was told in the ER a year or so ago that she would need her gallbladder out but she does not have insurance and never followed up. This pain seems to come and go, not necessarily tied to meals or activity. Not currently having significant pain there. Denies fever, chills, N/V/D, constipation, vaginal discharge. Does request STI testing. LMP 2 weeks ago. Not trying anything OTC for sxs.      Past Medical History:  Diagnosis Date  . Medical history non-contributory     Patient Active Problem List   Diagnosis Date Noted  . Fetal gastroschisis during pregnancy, antepartum 06/09/2018    Past Surgical History:  Procedure Laterality Date  . APPENDECTOMY      OB History    Gravida  3   Para  2   Term  1   Preterm  1   AB  0   Living  2     SAB  0   IAB  0   Ectopic  0   Multiple  0   Live Births  2            Home Medications    Prior to Admission medications   Medication Sig Start Date End Date Taking? Authorizing Provider  cephALEXin (KEFLEX) 500 MG capsule Take 1 capsule (500 mg total) by mouth 2 (two) times daily. 10/26/20   Particia Nearing, PA-C  ibuprofen (ADVIL) 600 MG tablet Take 1 tablet (600 mg total) by mouth every 6 (six) hours as needed. 03/28/20   Domenick Gong, MD  metroNIDAZOLE (FLAGYL) 500 MG tablet Take 1 tablet (500 mg total) by mouth 2 (two) times daily with a meal. DO NOT CONSUME ALCOHOL WHILE TAKING THIS MEDICATION. 08/22/20   Wallis Bamberg, PA-C    Family  History Family History  Problem Relation Age of Onset  . Diabetes Maternal Grandfather   . Healthy Mother   . Healthy Father     Social History Social History   Tobacco Use  . Smoking status: Current Some Day Smoker    Types: Cigarettes  . Smokeless tobacco: Never Used  Vaping Use  . Vaping Use: Never used  Substance Use Topics  . Alcohol use: Yes  . Drug use: Never     Allergies   Patient has no known allergies.   Review of Systems Review of Systems PER HPI   Physical Exam Triage Vital Signs ED Triage Vitals  Enc Vitals Group     BP 10/26/20 1617 (!) 107/45     Pulse Rate 10/26/20 1617 73     Resp 10/26/20 1617 15     Temp 10/26/20 1617 98.1 F (36.7 C)     Temp Source 10/26/20 1617 Oral     SpO2 10/26/20 1617 100 %     Weight 10/26/20 1614 130 lb (59 kg)     Height 10/26/20 1614 5\' 3"  (1.6 m)     Head Circumference --  Peak Flow --      Pain Score 10/26/20 1614 8     Pain Loc --      Pain Edu? --      Excl. in GC? --    No data found.  Updated Vital Signs BP (!) 107/45 (BP Location: Left Arm)   Pulse 73   Temp 98.1 F (36.7 C) (Oral)   Resp 15   Ht 5\' 3"  (1.6 m)   Wt 130 lb (59 kg)   LMP 10/13/2020   SpO2 100%   BMI 23.03 kg/m   Visual Acuity Right Eye Distance:   Left Eye Distance:   Bilateral Distance:    Right Eye Near:   Left Eye Near:    Bilateral Near:     Physical Exam Vitals and nursing note reviewed.  Constitutional:      Appearance: Normal appearance. She is not ill-appearing.  HENT:     Head: Atraumatic.  Eyes:     Extraocular Movements: Extraocular movements intact.     Conjunctiva/sclera: Conjunctivae normal.  Cardiovascular:     Rate and Rhythm: Normal rate and regular rhythm.     Heart sounds: Normal heart sounds.  Pulmonary:     Effort: Pulmonary effort is normal.     Breath sounds: Normal breath sounds.  Abdominal:     General: Bowel sounds are normal. There is no distension.     Palpations: Abdomen is  soft. There is no mass.     Tenderness: There is abdominal tenderness (RUQ ttp). There is no right CVA tenderness, left CVA tenderness, guarding or rebound. Negative signs include Murphy's sign.  Musculoskeletal:        General: Normal range of motion.     Cervical back: Normal range of motion and neck supple.  Skin:    General: Skin is warm and dry.  Neurological:     Mental Status: She is alert and oriented to person, place, and time.  Psychiatric:        Mood and Affect: Mood normal.        Thought Content: Thought content normal.        Judgment: Judgment normal.      UC Treatments / Results  Labs (all labs ordered are listed, but only abnormal results are displayed) Labs Reviewed  POCT URINALYSIS DIPSTICK, ED / UC - Abnormal; Notable for the following components:      Result Value   Hgb urine dipstick TRACE (*)    Nitrite POSITIVE (*)    Leukocytes,Ua TRACE (*)    All other components within normal limits  URINE CULTURE  CERVICOVAGINAL ANCILLARY ONLY    EKG   Radiology No results found.  Procedures Procedures (including critical care time)  Medications Ordered in UC Medications - No data to display  Initial Impression / Assessment and Plan / UC Course  I have reviewed the triage vital signs and the nursing notes.  Pertinent labs & imaging results that were available during my care of the patient were reviewed by me and considered in my medical decision making (see chart for details).     U/A positive for UTI, aptima swab pending. She is ttp over RUQ but afebrile and pain comes and goes at this time. Discussed need for close outpatient f/u and u/s imaging, ER if worsening with strict precautions reviewed.   Final Clinical Impressions(s) / UC Diagnoses   Final diagnoses:  Lower urinary tract infectious disease  RUQ pain   Discharge Instructions  None    ED Prescriptions    Medication Sig Dispense Auth. Provider   cephALEXin (KEFLEX) 500 MG capsule  Take 1 capsule (500 mg total) by mouth 2 (two) times daily. 10 capsule Particia Nearing, [place]     PDMP not reviewed this encounter.   Particia Nearing, [place] 10/26/20 1705

## 2020-10-28 LAB — CERVICOVAGINAL ANCILLARY ONLY
Bacterial Vaginitis (gardnerella): NEGATIVE
Candida Glabrata: NEGATIVE
Candida Vaginitis: NEGATIVE
Chlamydia: NEGATIVE
Comment: NEGATIVE
Comment: NEGATIVE
Comment: NEGATIVE
Comment: NEGATIVE
Comment: NEGATIVE
Comment: NORMAL
Neisseria Gonorrhea: NEGATIVE
Trichomonas: NEGATIVE

## 2020-10-29 LAB — URINE CULTURE: Culture: >=100000 — AB

## 2020-11-09 NOTE — L&D Delivery Note (Signed)
Monica [name] is a 25 y.o. female [phone] with IUP at [redacted]w[redacted]d admitted for spontaneous onset of labor.  She progressed without augmentation to complete and pushed 2 times to deliver.  Cord clamping delayed by several minutes then clamped by CNM and cut by mother of patient.    Delivery Note At 8:41 AM a viable and healthy female was delivered via  (Presentation:  ROA).  APGAR: 8, 9; weight pending.   Placenta status:  spontaneous, intact .  Cord: 3 vessels with the following complications: marginal cord insertion.   Anesthesia: None Episiotomy:  n/a Lacerations:  none Suture Repair: n/a Est. Blood Loss (mL):  150  Mom to postpartum.  Baby to Couplet care / Skin to Skin.  Rolm Bookbinder CNM 07/07/2021, 8:49 AM

## 2020-12-06 ENCOUNTER — Inpatient Hospital Stay (HOSPITAL_COMMUNITY): Payer: Self-pay

## 2020-12-06 ENCOUNTER — Inpatient Hospital Stay (HOSPITAL_COMMUNITY)
Admission: AD | Admit: 2020-12-06 | Discharge: 2020-12-06 | Disposition: A | Discharge status: Home / Self Care | Payer: Self-pay | Attending: Obstetrics & Gynecology | Admitting: Obstetrics & Gynecology

## 2020-12-06 ENCOUNTER — Other Ambulatory Visit: Payer: Self-pay

## 2020-12-06 ENCOUNTER — Encounter (HOSPITAL_COMMUNITY): Payer: Self-pay | Admitting: Obstetrics & Gynecology

## 2020-12-06 DIAGNOSIS — O2391 Unspecified genitourinary tract infection in pregnancy, first trimester: Secondary | ICD-10-CM | POA: Insufficient documentation

## 2020-12-06 DIAGNOSIS — O26891 Other specified pregnancy related conditions, first trimester: Secondary | ICD-10-CM | POA: Insufficient documentation

## 2020-12-06 DIAGNOSIS — B373 Candidiasis of vulva and vagina: Secondary | ICD-10-CM

## 2020-12-06 DIAGNOSIS — O26899 Other specified pregnancy related conditions, unspecified trimester: Secondary | ICD-10-CM

## 2020-12-06 DIAGNOSIS — O99891 Other specified diseases and conditions complicating pregnancy: Secondary | ICD-10-CM

## 2020-12-06 DIAGNOSIS — W109XXA Fall (on) (from) unspecified stairs and steps, initial encounter: Secondary | ICD-10-CM

## 2020-12-06 DIAGNOSIS — Z3A01 Less than 8 weeks gestation of pregnancy: Secondary | ICD-10-CM

## 2020-12-06 DIAGNOSIS — R109 Unspecified abdominal pain: Secondary | ICD-10-CM

## 2020-12-06 DIAGNOSIS — O99331 Smoking (tobacco) complicating pregnancy, first trimester: Secondary | ICD-10-CM | POA: Insufficient documentation

## 2020-12-06 DIAGNOSIS — F17210 Nicotine dependence, cigarettes, uncomplicated: Secondary | ICD-10-CM | POA: Insufficient documentation

## 2020-12-06 DIAGNOSIS — O98819 Other maternal infectious and parasitic diseases complicating pregnancy, unspecified trimester: Secondary | ICD-10-CM

## 2020-12-06 DIAGNOSIS — Z3491 Encounter for supervision of normal pregnancy, unspecified, first trimester: Secondary | ICD-10-CM

## 2020-12-06 DIAGNOSIS — O2341 Unspecified infection of urinary tract in pregnancy, first trimester: Secondary | ICD-10-CM

## 2020-12-06 DIAGNOSIS — O98811 Other maternal infectious and parasitic diseases complicating pregnancy, first trimester: Secondary | ICD-10-CM

## 2020-12-06 LAB — URINALYSIS, ROUTINE W REFLEX MICROSCOPIC
Bilirubin Urine: NEGATIVE
Glucose, UA: NEGATIVE mg/dL
Hgb urine dipstick: NEGATIVE
Ketones, ur: NEGATIVE mg/dL
Leukocytes,Ua: NEGATIVE
Nitrite: POSITIVE — AB
Protein, ur: NEGATIVE mg/dL
Specific Gravity, Urine: 1.024 (ref 1.005–1.030)
pH: 6 (ref 5.0–8.0)

## 2020-12-06 LAB — WET PREP, GENITAL
Clue Cells Wet Prep HPF POC: NONE SEEN
Sperm: NONE SEEN
Trich, Wet Prep: NONE SEEN

## 2020-12-06 LAB — CBC
HCT: 35.8 % — ABNORMAL LOW (ref 36.0–46.0)
Hemoglobin: 11.7 g/dL — ABNORMAL LOW (ref 12.0–15.0)
MCH: 28.7 pg (ref 26.0–34.0)
MCHC: 32.7 g/dL (ref 30.0–36.0)
MCV: 87.7 fL (ref 80.0–100.0)
Platelets: 295 10*3/uL (ref 150–400)
RBC: 4.08 MIL/uL (ref 3.87–5.11)
RDW: 12.8 % (ref 11.5–15.5)
WBC: 9.6 10*3/uL (ref 4.0–10.5)
nRBC: 0 % (ref 0.0–0.2)

## 2020-12-06 LAB — HCG, QUANTITATIVE, PREGNANCY: hCG, Beta Chain, Quant, S: 90292 m[IU]/mL — ABNORMAL HIGH (ref <5)

## 2020-12-06 LAB — POCT PREGNANCY, URINE: Preg Test, Ur: POSITIVE — AB

## 2020-12-06 MED ORDER — TERCONAZOLE 0.4 % VA CREA: 1.0000 | TOPICAL_CREAM | Freq: Every day | VAGINAL | 0 refills | Status: DC

## 2020-12-06 MED ORDER — CEFADROXIL 500 MG PO CAPS: 500.0000 mg | ORAL_CAPSULE | Freq: Two times a day (BID) | ORAL | 0 refills | Status: AC

## 2020-12-06 NOTE — Discharge Instructions (Signed)
Safe Medications in Pregnancy    Acne: Benzoyl Peroxide Salicylic Acid  Backache/Headache: Tylenol: 2 regular strength every 4 hours OR              2 Extra strength every 6 hours  Colds/Coughs/Allergies: Benadryl (alcohol free) 25 mg every 6 hours as needed Breath right strips Claritin Cepacol throat lozenges Chloraseptic throat spray Cold-Eeze- up to three times per day Cough drops, alcohol free Flonase (by prescription only) Guaifenesin Mucinex Robitussin DM (plain only, alcohol free) Saline nasal spray/drops Sudafed (pseudoephedrine) & Actifed ** use only after [redacted] weeks gestation and if you do not have high blood pressure Tylenol Vicks Vaporub Zinc lozenges Zyrtec   Constipation: Colace Ducolax suppositories Fleet enema Glycerin suppositories Metamucil Milk of magnesia Miralax Senokot Smooth move tea  Diarrhea: Kaopectate Imodium A-D  *NO pepto Bismol  Hemorrhoids: Anusol Anusol HC Preparation H Tucks  Indigestion: Tums Maalox Mylanta Zantac  Pepcid  Insomnia: Benadryl (alcohol free) 25mg every 6 hours as needed Tylenol PM Unisom, no Gelcaps  Leg Cramps: Tums MagGel  Nausea/Vomiting:  Bonine Dramamine Emetrol Ginger extract Sea bands Meclizine  Nausea medication to take during pregnancy:  Unisom (doxylamine succinate 25 mg tablets) Take one tablet daily at bedtime. If symptoms are not adequately controlled, the dose can be increased to a maximum recommended dose of two tablets daily (1/2 tablet in the morning, 1/2 tablet mid-afternoon and one at bedtime). Vitamin B6 100mg tablets. Take one tablet twice a day (up to 200 mg per day).  Skin Rashes: Aveeno products Benadryl cream or 25mg every 6 hours as needed Calamine Lotion 1% cortisone cream  Yeast infection: Gyne-lotrimin 7 Monistat 7   **If taking multiple medications, please check labels to avoid duplicating the same active ingredients **take  medication as directed on the label ** Do not exceed 4000 mg of tylenol in 24 hours **Do not take medications that contain aspirin or ibuprofen   Prenatal Care Providers           [facility]  [address] [phone]  [facility]   [address]  [phone]  [facility] [place]       [address] [phone]            [facility] [place]     [address] [phone]          [facility] [place]   [address] [phone]  [facility]  [address] [phone]     [facility]  [address]   [phone]     [facility]  Phone: [phone]  [facility]  Phone: [phone]  [facility] Phone: [phone]  [facility] Phone: [phone]  [facility] Phone: [phone]  [facility] Phone: [phone]  [facility]  Phone: [phone]  

## 2020-12-06 NOTE — MAU Note (Addendum)
Reports fell down 6 stairs @ 0730 this morning.  Reports struck abdomen on the last stair.  Reports having abdominal cramping, no VB.  LMP 10/13/2020.

## 2020-12-06 NOTE — MAU Provider Note (Signed)
History     CSN: [id number]  Arrival date and time: 12/06/20 1607   Event Date/Time   First Provider Initiated Contact with Patient 12/06/20 1900      Chief Complaint  Patient presents with  . Fall   HPI Monica [name] is a 25 y.o. [phone] at [redacted]w[redacted]d who presents stating she fell down the stairs at 0730. She reports hitting her abdomen on the bottom step and has had cramping since then. She denies any vaginal bleeding or discharge. She has not been seen anywhere this pregnancy.   OB History    Gravida  4   Para  2   Term  1   Preterm  1   AB  1   Living  2     SAB  1   IAB  0   Ectopic  0   Multiple  0   Live Births  2           Past Medical History:  Diagnosis Date  . Medical history non-contributory     Past Surgical History:  Procedure Laterality Date  . APPENDECTOMY      Family History  Problem Relation Age of Onset  . Diabetes Maternal Grandfather   . Healthy Mother   . Healthy Father     Social History   Tobacco Use  . Smoking status: Current Some Day Smoker    Types: Cigarettes  . Smokeless tobacco: Never Used  Vaping Use  . Vaping Use: Never used  Substance Use Topics  . Alcohol use: Yes  . Drug use: Never    Allergies: No Known Allergies  No medications prior to admission.    Review of Systems  Constitutional: Negative.  Negative for fatigue and fever.  HENT: Negative.   Respiratory: Negative.  Negative for shortness of breath.   Cardiovascular: Negative.  Negative for chest pain.  Gastrointestinal: Positive for abdominal pain. Negative for constipation, diarrhea, nausea and vomiting.  Genitourinary: Negative.  Negative for dysuria, vaginal bleeding and vaginal discharge.  Neurological: Negative.  Negative for dizziness and headaches.   Physical Exam   Blood pressure (!) 117/59, pulse 78, temperature 98.4 F (36.9 C), temperature source Oral, resp. rate 20, height 5\' 3"  (1.6 m), weight 60.6 kg, last menstrual  period 10/13/2020, SpO2 100 %.  Physical Exam Vitals and nursing note reviewed.  Constitutional:      General: She is not in acute distress.    Appearance: She is well-developed and well-nourished.  HENT:     Head: Normocephalic.  Eyes:     Pupils: Pupils are equal, round, and reactive to light.  Cardiovascular:     Rate and Rhythm: Normal rate and regular rhythm.     Heart sounds: Normal heart sounds.  Pulmonary:     Effort: Pulmonary effort is normal. No respiratory distress.     Breath sounds: Normal breath sounds.  Abdominal:     General: Bowel sounds are normal. There is no distension.     Palpations: Abdomen is soft.     Tenderness: There is no abdominal tenderness.  Skin:    General: Skin is warm and dry.  Neurological:     Mental Status: She is alert and oriented to person, place, and time.  Psychiatric:        Mood and Affect: Mood and affect normal.        Behavior: Behavior normal.        Thought Content: Thought content normal.  Judgment: Judgment normal.     MAU Course  Procedures Results for orders placed or performed during the hospital encounter of 12/06/20 (from the past 24 hour(s))  Pregnancy, urine POC     Status: Abnormal   Collection Time: 12/06/20  5:10 PM  Result Value Ref Range   Preg Test, Ur POSITIVE (A) NEGATIVE  Urinalysis, Routine w reflex microscopic Urine, Clean Catch     Status: Abnormal   Collection Time: 12/06/20  5:47 PM  Result Value Ref Range   Color, Urine YELLOW YELLOW   APPearance HAZY (A) CLEAR   Specific Gravity, Urine 1.024 1.005 - 1.030   pH 6.0 5.0 - 8.0   Glucose, UA NEGATIVE NEGATIVE mg/dL   Hgb urine dipstick NEGATIVE NEGATIVE   Bilirubin Urine NEGATIVE NEGATIVE   Ketones, ur NEGATIVE NEGATIVE mg/dL   Protein, ur NEGATIVE NEGATIVE mg/dL   Nitrite POSITIVE (A) NEGATIVE   Leukocytes,Ua NEGATIVE NEGATIVE   RBC / HPF 0-5 0 - 5 RBC/hpf   WBC, UA 0-5 0 - 5 WBC/hpf   Bacteria, UA RARE (A) NONE SEEN   Squamous  Epithelial / LPF 0-5 0 - 5   Mucus PRESENT   Wet prep, genital     Status: Abnormal   Collection Time: 12/06/20  5:47 PM   Specimen: PATH Cytology Cervicovaginal Ancillary Only  Result Value Ref Range   Yeast Wet Prep HPF POC PRESENT (A) NONE SEEN   Trich, Wet Prep NONE SEEN NONE SEEN   Clue Cells Wet Prep HPF POC NONE SEEN NONE SEEN   WBC, Wet Prep HPF POC MANY (A) NONE SEEN   Sperm NONE SEEN   CBC     Status: Abnormal   Collection Time: 12/06/20  5:57 PM  Result Value Ref Range   WBC 9.6 4.0 - 10.5 K/uL   RBC 4.08 3.87 - 5.11 MIL/uL   Hemoglobin 11.7 (L) 12.0 - 15.0 g/dL   HCT 16.1 (L) 09.6 - 04.5 %   MCV 87.7 80.0 - 100.0 fL   MCH 28.7 26.0 - 34.0 pg   MCHC 32.7 30.0 - 36.0 g/dL   RDW 40.9 81.1 - 91.4 %   Platelets 295 150 - 400 K/uL   nRBC 0.0 0.0 - 0.2 %   US OB Comp Less 14 Wks  Result Date: 12/06/2020 CLINICAL DATA:  Abdomen pain EXAM: OBSTETRIC <14 WK ULTRASOUND TECHNIQUE: Transabdominal ultrasound was performed for evaluation of the gestation as well as the maternal uterus and adnexal regions. COMPARISON:  None. FINDINGS: Intrauterine gestational sac: Single intrauterine gestational sac Yolk sac:  Visualized Embryo:  Visualized Cardiac Activity: Visualized Heart Rate: 169 bpm CRL: 13.5 mm   7 w 4 d                  [place] EDC: 07/21/2021 Subchorionic hemorrhage:  Moderate subchorionic hemorrhage. Maternal uterus/adnexae: Ovaries are within normal limits. Right ovary measures 3.4 x 2.3 x 2.3 cm. Left ovary measures 3.8 x 2 x 2 cm. No significant free fluid IMPRESSION: 1. Single viable intrauterine pregnancy as above. 2. Moderate subchorionic hemorrhage Electronically Signed   By: Jasmine Pang M.D.   On: 12/06/2020 18:55   MDM UA, UPT, UC CBC, HCG ABO/Rh- B Pos Wet prep and gc/chlamydia US OB Comp Less 14 weeks with Transvaginal   Assessment and Plan   1. Normal intrauterine pregnancy on prenatal ultrasound in first trimester   2. Abdominal pain affecting pregnancy   3. [redacted]  weeks gestation of pregnancy  4. Urinary tract infection in mother during first trimester of pregnancy   5. Candidiasis of vagina during pregnancy    -Discharge home in stable condition -Rx for terazol and duricef sent to patient's pharmacy -First trimester precautions discussed -Patient advised to follow-up with OB to start prenatal care, list given -Patient may return to MAU as needed or if her condition were to change or worsen   Rolm Bookbinder CNM 12/06/2020, 7:33 PM

## 2020-12-09 LAB — GC/CHLAMYDIA PROBE AMP (~~LOC~~) NOT AT ARMC
Chlamydia: POSITIVE — AB
Comment: NEGATIVE
Comment: NORMAL
Neisseria Gonorrhea: NEGATIVE

## 2020-12-09 LAB — CULTURE, OB URINE: Culture: >=100000 — AB

## 2020-12-14 ENCOUNTER — Ambulatory Visit (HOSPITAL_COMMUNITY): Payer: Self-pay

## 2020-12-26 ENCOUNTER — Other Ambulatory Visit (INDEPENDENT_AMBULATORY_CARE_PROVIDER_SITE_OTHER): Payer: Self-pay

## 2021-01-07 ENCOUNTER — Other Ambulatory Visit: Payer: Self-pay

## 2021-01-07 ENCOUNTER — Other Ambulatory Visit (HOSPITAL_COMMUNITY)
Admission: RE | Admit: 2021-01-07 | Discharge: 2021-01-07 | Disposition: A | Discharge status: Home / Self Care | Payer: Self-pay | Source: Ambulatory Visit | Attending: Family Medicine | Admitting: Family Medicine

## 2021-01-07 ENCOUNTER — Ambulatory Visit (INDEPENDENT_AMBULATORY_CARE_PROVIDER_SITE_OTHER): Payer: Self-pay | Admitting: Family Medicine

## 2021-01-07 ENCOUNTER — Encounter: Payer: Self-pay | Admitting: Family Medicine

## 2021-01-07 ENCOUNTER — Encounter: Payer: Self-pay | Admitting: *Deleted

## 2021-01-07 VITALS — BP 114/59 | HR 77 | Wt 131.5 lb

## 2021-01-07 DIAGNOSIS — O2340 Unspecified infection of urinary tract in pregnancy, unspecified trimester: Secondary | ICD-10-CM

## 2021-01-07 DIAGNOSIS — O98319 Other infections with a predominantly sexual mode of transmission complicating pregnancy, unspecified trimester: Secondary | ICD-10-CM | POA: Insufficient documentation

## 2021-01-07 DIAGNOSIS — A568 Sexually transmitted chlamydial infection of other sites: Secondary | ICD-10-CM

## 2021-01-07 DIAGNOSIS — O09899 Supervision of other high risk pregnancies, unspecified trimester: Secondary | ICD-10-CM

## 2021-01-07 DIAGNOSIS — O98311 Other infections with a predominantly sexual mode of transmission complicating pregnancy, first trimester: Secondary | ICD-10-CM

## 2021-01-07 DIAGNOSIS — O09299 Supervision of pregnancy with other poor reproductive or obstetric history, unspecified trimester: Secondary | ICD-10-CM

## 2021-01-07 DIAGNOSIS — O0990 Supervision of high risk pregnancy, unspecified, unspecified trimester: Secondary | ICD-10-CM | POA: Insufficient documentation

## 2021-01-07 LAB — POCT URINALYSIS DIP (DEVICE)
Bilirubin Urine: NEGATIVE
Glucose, UA: NEGATIVE mg/dL
Ketones, ur: 15 mg/dL — AB
Nitrite: NEGATIVE
Protein, ur: NEGATIVE mg/dL
Specific Gravity, Urine: 1.025 (ref 1.005–1.030)
Urobilinogen, UA: 0.2 mg/dL (ref 0.0–1.0)
pH: 6 (ref 5.0–8.0)

## 2021-01-07 MED ORDER — PRENATAL VITAMINS 28-0.8 MG PO TABS
1.0000 | ORAL_TABLET | Freq: Every day | ORAL | 11 refills | Status: DC
Start: 2021-01-07 — End: 2022-04-07

## 2021-01-07 NOTE — Progress Notes (Signed)
Here for initial prenatal visit. Denies any issues since MAU visit. Already has MyChart account. Has applied for Pregnancy Medicaid. Given bp cuff with instructions. Sent Babyscripts app to sign up and download

## 2021-01-07 NOTE — Progress Notes (Signed)
History:   Monica [name] is a 25 y.o. [id number] at [redacted]w[redacted]d by LMP being seen today for her first obstetrical visit.  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 Patient does intend to breast feed. Pregnancy history fully reviewed.  Patient reports dysuria. Denies hematuria, urgency, frequency, abnormal vaginal discharge.       HISTORY: OB History  Gravida Para Term Preterm AB Living  4 2 1 1 1 2   SAB IAB Ectopic Multiple Live Births  1 0 0 0 2    # Outcome Date GA Lbr Len/2nd Weight Sex Delivery Anes PTL Lv  4 Current           3 SAB 2021 [redacted]w[redacted]d         2 Preterm 10/12/18 [redacted]w[redacted]d 15:54 / 00:01 5 lb 10.3 oz (2.56 kg) M Vag-Spont None  LIV     Birth Comments: baby born with gastroschesis     Name: HERNANDEZ RAMIREZ,BOY Anika     Apgar1: 8  Apgar5: 9  1 Term 10/22/16 [redacted]w[redacted]d 16:05 6 lb 7 oz (2.92 kg) M Vag-Spont None  LIV     Birth Comments: wnl     Name: Moening,BOY Jacalynn     Apgar1: 8  Apgar5: 9    Last pap smear was done 09/09/2017 and was normal  Past Medical History:  Diagnosis Date  . Preterm labor    Past Surgical History:  Procedure Laterality Date  . APPENDECTOMY     Family History  Problem Relation Age of Onset  . Diabetes Maternal Grandfather   . Healthy Mother   . Healthy Father    Social History   Tobacco Use  . Smoking status: Former Smoker    Types: Cigarettes    Quit date: 01/08/2020    Years since quitting: 1.0  . Smokeless tobacco: Never Used  Vaping Use  . Vaping Use: Never used  Substance Use Topics  . Alcohol use: Not Currently    Comment: occasionally  . Drug use: Never   No Known Allergies Current Outpatient Medications on File Prior to Visit  Medication Sig Dispense Refill  . terconazole (TERAZOL 7) 0.4 % vaginal cream Place 1 applicator vaginally at bedtime. 45 g 0   No current facility-administered medications on  file prior to visit.    Review of Systems Pertinent items noted in HPI and remainder of comprehensive ROS otherwise negative. Physical Exam:   Vitals:   01/07/21 1412  BP: (!) 114/59  Pulse: 77  Weight: 131 lb 8 oz (59.6 kg)   Fetal Heart Rate (bpm): 150  Uterus:     Pelvic Exam: Perineum: no hemorrhoids, normal perineum   Vulva: normal external genitalia, no lesions   Vagina:  normal mucosa, normal discharge   Cervix: no lesions and normal, pap smear done.    Adnexa: normal adnexa and no mass, fullness, tenderness   Bony Pelvis: average  System: General: well-developed, well-nourished female in no acute distress   Breasts:  normal appearance, no masses or tenderness bilaterally   Skin: normal coloration and turgor, no rashes   Neurologic: oriented, normal, negative, normal mood   Extremities: normal strength, tone, and muscle mass, ROM of all joints is normal   HEENT PERRLA, extraocular movement intact and sclera clear, anicteric   Mouth/Teeth mucous membranes moist, pharynx normal without lesions and dental hygiene good   Neck supple and no masses   Cardiovascular: regular  rate and rhythm   Respiratory:  no respiratory distress, normal breath sounds   Abdomen: soft, non-tender;  no masses,  no organomegaly    Assessment:    Pregnancy: [id number] Patient Active Problem List   Diagnosis Date Noted  . Chlamydia trachomatis infection in pregnancy 01/07/2021  . History of preterm delivery, currently pregnant 01/07/2021  . 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 Supervision of high-risk pregnancy 04/27/2018  . UTI in pregnancy, antepartum 05/13/2016     Plan:    1. UTI in pregnancy, antepartum +E. Coli UTI 12/06/20 and 10/2020, both treated. Given dysuria will repeat urine culture today.  2. Supervision of high risk pregnancy, antepartum -Taking PNV, no complaints, VSS -Discussed contraception, thinks she  would like liletta IUD, discussed risks/benefits - [facility] AMB BABYSCRIPTS SCHEDULE OPTIMIZATION - CBC/D/Plt+RPR+Rh+ABO+Rub Ab... - GC/Chlamydia probe amp ([place])not at [facility] - Genetic Screening - US MFM OB COMP + 14 WK; Future - Hemoglobin A1c - Cytology - PAP( [place]) - Culture, OB Urine  3. Chlamydia trachomatis infection in mother during first trimester of pregnancy +12/06/20, treated. Partner treated. Will perform TOC today.  4. History of preterm delivery, currently pregnant History of child with gastroschisis, delivery [redacted]w[redacted]d.    Initial labs drawn. Continue prenatal vitamins. Problem list reviewed and updated. Genetic Screening discussed, NIPS: ordered. Ultrasound discussed; fetal anatomic survey: ordered. Anticipatory guidance about prenatal visits given including labs, ultrasounds, and testing. Discussed usage of Babyscripts and virtual visits as additional source of managing and completing prenatal visits in midst of coronavirus and pandemic.   Encouraged to complete MyChart Registration for her ability to review results, send requests, and have questions addressed.  The nature of [facility]Faculty Practice with multiple MDs and Advanced Practice Providers was explained to patient; also emphasized that residents, students are part of our team. Routine obstetric precautions reviewed. Encouraged to seek out care at office or emergency room [facility] MAU preferred) for urgent and/or emergent concerns. Return in about 4 weeks (around 02/04/2021) for LROB; in person.     Alric Seton, MD OB Fellow, Faculty [facility], Center for [facility] 01/07/2021 3:25 PM

## 2021-01-08 ENCOUNTER — Other Ambulatory Visit: Payer: Self-pay | Admitting: Family Medicine

## 2021-01-08 ENCOUNTER — Telehealth: Payer: Self-pay

## 2021-01-08 DIAGNOSIS — A568 Sexually transmitted chlamydial infection of other sites: Secondary | ICD-10-CM

## 2021-01-08 LAB — CBC/D/PLT+RPR+RH+ABO+RUB AB...
Antibody Screen: NEGATIVE
Basophils Absolute: 0 10*3/uL (ref 0.0–0.2)
Basos: 0 %
EOS (ABSOLUTE): 0.1 10*3/uL (ref 0.0–0.4)
Eos: 1 %
HCV Ab: <0.1 s/co ratio (ref 0.0–0.9)
HIV Screen 4th Generation wRfx: NONREACTIVE
Hematocrit: 37.3 % (ref 34.0–46.6)
Hemoglobin: 12.4 g/dL (ref 11.1–15.9)
Hepatitis B Surface Ag: NEGATIVE
Immature Grans (Abs): 0 10*3/uL (ref 0.0–0.1)
Immature Granulocytes: 0 %
Lymphocytes Absolute: 2 10*3/uL (ref 0.7–3.1)
Lymphs: 21 %
MCH: 28.9 pg (ref 26.6–33.0)
MCHC: 33.2 g/dL (ref 31.5–35.7)
MCV: 87 fL (ref 79–97)
Monocytes Absolute: 0.4 10*3/uL (ref 0.1–0.9)
Monocytes: 4 %
Neutrophils Absolute: 6.9 10*3/uL (ref 1.4–7.0)
Neutrophils: 74 %
Platelets: 275 10*3/uL (ref 150–450)
RBC: 4.29 x10E6/uL (ref 3.77–5.28)
RDW: 12.5 % (ref 11.7–15.4)
RPR Ser Ql: NONREACTIVE
Rh Factor: POSITIVE
Rubella Antibodies, IGG: 1.53 index (ref >0.99)
WBC: 9.5 10*3/uL (ref 3.4–10.8)

## 2021-01-08 LAB — HEMOGLOBIN A1C
Est. average glucose Bld gHb Est-mCnc: 94 mg/dL
Hgb A1c MFr Bld: 4.9 % (ref 4.8–5.6)

## 2021-01-08 LAB — GC/CHLAMYDIA PROBE AMP (~~LOC~~) NOT AT ARMC
Chlamydia: POSITIVE — AB
Comment: NEGATIVE
Comment: NORMAL
Neisseria Gonorrhea: NEGATIVE

## 2021-01-08 LAB — HCV INTERPRETATION

## 2021-01-08 MED ORDER — AZITHROMYCIN 500 MG PO TABS: 1000.0000 mg | ORAL_TABLET | Freq: Every day | ORAL | 0 refills | Status: AC

## 2021-01-08 NOTE — Telephone Encounter (Signed)
Called pt to notify her of positive Chlamydia result. Explained medication has been sent to your pharmacy for treatment. Pt advised to have partner be treated at health department and to not have intercourse until 7 days following both people have been treated. Pt verbalizes understanding.

## 2021-01-08 NOTE — Progress Notes (Signed)
Rx for azithromycin sent to pharmacy to treat chlamydia, TOC positive. Message sent to patient in myChart.  Alric Seton, MD OB Fellow, Faculty [facility], Center for [facility] 01/08/2021 3:24 PM

## 2021-01-09 ENCOUNTER — Encounter: Payer: Self-pay | Admitting: *Deleted

## 2021-01-09 ENCOUNTER — Encounter: Payer: Self-pay | Admitting: Family Medicine

## 2021-01-09 DIAGNOSIS — R87612 Low grade squamous intraepithelial lesion on cytologic smear of cervix (LGSIL): Secondary | ICD-10-CM | POA: Insufficient documentation

## 2021-01-09 LAB — CYTOLOGY - PAP

## 2021-01-09 NOTE — Progress Notes (Signed)
STD report completed and faxed to [facility] department per protocol. Erik Burkett,RN

## 2021-01-10 ENCOUNTER — Other Ambulatory Visit: Payer: Self-pay | Admitting: Obstetrics and Gynecology

## 2021-01-10 DIAGNOSIS — A568 Sexually transmitted chlamydial infection of other sites: Secondary | ICD-10-CM

## 2021-01-10 LAB — CULTURE, OB URINE

## 2021-01-10 LAB — URINE CULTURE, OB REFLEX

## 2021-01-10 MED ORDER — AZITHROMYCIN 500 MG PO TABS: 1000.0000 mg | ORAL_TABLET | Freq: Once | ORAL | 1 refills | Status: AC

## 2021-01-10 NOTE — Progress Notes (Signed)
Rx sent to pharmacy   

## 2021-01-13 ENCOUNTER — Other Ambulatory Visit: Payer: Self-pay | Admitting: Family Medicine

## 2021-01-13 DIAGNOSIS — R8271 Bacteriuria: Secondary | ICD-10-CM

## 2021-01-13 MED ORDER — AMOXICILLIN-POT CLAVULANATE 875-125 MG PO TABS
1.0000 | ORAL_TABLET | Freq: Two times a day (BID) | ORAL | 0 refills | Status: AC
Start: 2021-01-13 — End: 2021-01-18

## 2021-01-14 ENCOUNTER — Telehealth: Payer: Self-pay | Admitting: Lactation Services

## 2021-01-14 NOTE — Telephone Encounter (Signed)
-----   Message from Alric Seton, MD sent at 01/13/2021  9:02 PM EST ----- Call patient and notify urine still positive for E coli, I have sent antibiotic to her pharmacy. Please take with food and finish entire course. Thanks!  Alric Seton, MD OB Fellow, Faculty [facility], Center for [facility] 01/13/2021 9:02 PM

## 2021-01-14 NOTE — Telephone Encounter (Signed)
Called patient to inform her of results of most recent UTI. Reviewed that she will take Augmentin for 5 day. Advised to take ATB with food and to take a Probiotic and/or eat yogurt about 1-2 hours after taking each dose of ATB. Reviewed it is important to take all the medication until finished.  Patient voiced understanding.

## 2021-01-28 ENCOUNTER — Telehealth: Payer: Self-pay | Admitting: Lactation Services

## 2021-01-28 ENCOUNTER — Encounter: Payer: Self-pay | Admitting: Family Medicine

## 2021-01-28 DIAGNOSIS — D563 Thalassemia minor: Secondary | ICD-10-CM | POA: Insufficient documentation

## 2021-01-28 NOTE — Telephone Encounter (Signed)
Patient called in regards to earlier message. Called patient back. She was informed of Genetic Testing results. She has an appointment on Monday with [facility].   Patient has an appointment on 3/29, she will pick up partner testing kit when here.   Mom is concerned as last child has Gastroschesis or Oomphalacele and spent 6 months in the NICU. Reviewed that that condition is usually picked up on [place]. And will be done between 18-20 weeks.

## 2021-01-28 NOTE — Telephone Encounter (Signed)
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 Results show that she is a silent carrier for Alpha- Thalassemia. Patient did not answer and voicemail not set up so not able to leave a message. Will send My Chart message.

## 2021-02-04 ENCOUNTER — Other Ambulatory Visit: Payer: Self-pay

## 2021-02-04 ENCOUNTER — Ambulatory Visit (INDEPENDENT_AMBULATORY_CARE_PROVIDER_SITE_OTHER): Payer: Self-pay | Admitting: Family Medicine

## 2021-02-04 VITALS — BP 111/72 | HR 78 | Wt 132.0 lb

## 2021-02-04 DIAGNOSIS — O09899 Supervision of other high risk pregnancies, unspecified trimester: Secondary | ICD-10-CM

## 2021-02-04 DIAGNOSIS — O2340 Unspecified infection of urinary tract in pregnancy, unspecified trimester: Secondary | ICD-10-CM

## 2021-02-04 DIAGNOSIS — R87612 Low grade squamous intraepithelial lesion on cytologic smear of cervix (LGSIL): Secondary | ICD-10-CM

## 2021-02-04 DIAGNOSIS — D563 Thalassemia minor: Secondary | ICD-10-CM

## 2021-02-04 DIAGNOSIS — A568 Sexually transmitted chlamydial infection of other sites: Secondary | ICD-10-CM

## 2021-02-04 DIAGNOSIS — O98312 Other infections with a predominantly sexual mode of transmission complicating pregnancy, second trimester: Secondary | ICD-10-CM

## 2021-02-04 DIAGNOSIS — O0992 Supervision of high risk pregnancy, unspecified, second trimester: Secondary | ICD-10-CM

## 2021-02-04 DIAGNOSIS — O09299 Supervision of pregnancy with other poor reproductive or obstetric history, unspecified trimester: Secondary | ICD-10-CM

## 2021-02-04 MED ORDER — AZITHROMYCIN 500 MG PO TABS: 1000.0000 mg | ORAL_TABLET | Freq: Once | ORAL | Status: DC

## 2021-02-04 NOTE — Progress Notes (Signed)
   Subjective:  Monica [name] is a 25 y.o. [phone] at [redacted]w[redacted]d being seen today for ongoing prenatal care.  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  Patient reports nausea.  Contractions: Not present. Vag. Bleeding: Scant.  Movement: Absent. Denies leaking of fluid.   The following portions of the patient's history were reviewed and updated as appropriate: allergies, current medications, past family history, past medical history, past social history, past surgical history and problem list. Problem list updated.  Objective:   Vitals:   02/04/21 1553  BP: 111/72  Pulse: 78  Weight: 132 lb (59.9 kg)    Fetal Status: Fetal Heart Rate (bpm): 153   Movement: Absent     General:  Alert, oriented and cooperative. Patient is in no acute distress.  Skin: Skin is warm and dry. No rash noted.   Cardiovascular: Normal heart rate noted  Respiratory: Normal respiratory effort, no problems with respiration noted  Abdomen: Soft, gravid, appropriate for gestational age. Pain/Pressure: Absent     Pelvic: Vag. Bleeding: Scant     Cervical exam deferred        Extremities: Normal range of motion.  Edema: None  Mental Status: Normal mood and affect. Normal behavior. Normal judgment and thought content.   Urinalysis:      Assessment and Plan:  Pregnancy: [id number] at [redacted]w[redacted]d  1. Supervision of high risk pregnancy in second trimester BP and FHR normal AFP today Anatomy scan scheduled for 02/24/2021  2. UTI in pregnancy, antepartum Treated after last visit, reports she completed course, TOC today  3.  Chlamydia trachomatis infection in mother during second trimester of pregnancy Reports she threw up pills after last treatment almost immediately Re treat today, counseled to not have intercourse for one week TOC at next visit  4. History of preterm delivery, currently pregnant [redacted] weeks  5. 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 LGSIL on Pap smear of cervix Repeat cytology 01/07/2021  7. Alpha thalassemia silent carrier Partner given testing kit today  Preterm labor symptoms and general obstetric precautions including but not limited to vaginal bleeding, contractions, leaking of fluid and fetal movement were reviewed in detail with the patient. Please refer to After Visit Summary for other counseling recommendations.  Return in 4 weeks (on 03/04/2021).   Clarnce Flock, MD

## 2021-02-04 NOTE — Patient Instructions (Signed)
 Contraception Choices Contraception, also called birth control, refers to methods or devices that prevent pregnancy. Hormonal methods Contraceptive implant A contraceptive implant is a thin, plastic tube that contains a hormone that prevents pregnancy. It is different from an intrauterine device (IUD). It is inserted into the upper part of the arm by a health care provider. Implants can be effective for up to 3 years. Progestin-only injections Progestin-only injections are injections of progestin, a synthetic form of the hormone progesterone. They are given every 3 months by a health care provider. Birth control pills Birth control pills are pills that contain hormones that prevent pregnancy. They must be taken once a day, preferably at the same time each day. A prescription is needed to use this method of contraception. Birth control patch The birth control patch contains hormones that prevent pregnancy. It is placed on the skin and must be changed once a week for three weeks and removed on the fourth week. A prescription is needed to use this method of contraception. Vaginal ring A vaginal ring contains hormones that prevent pregnancy. It is placed in the vagina for three weeks and removed on the fourth week. After that, the process is repeated with a new ring. A prescription is needed to use this method of contraception. Emergency contraceptive Emergency contraceptives prevent pregnancy after unprotected sex. They come in pill form and can be taken up to 5 days after sex. They work best the sooner they are taken after having sex. Most emergency contraceptives are available without a prescription. This method should not be used as your only form of birth control.   Barrier methods Female condom A female condom is a thin sheath that is worn over the penis during sex. Condoms keep sperm from going inside a woman's body. They can be used with a sperm-killing substance (spermicide) to increase their  effectiveness. They should be thrown away after one use. Female condom A female condom is a soft, loose-fitting sheath that is put into the vagina before sex. The condom keeps sperm from going inside a woman's body. They should be thrown away after one use. Diaphragm A diaphragm is a soft, dome-shaped barrier. It is inserted into the vagina before sex, along with a spermicide. The diaphragm blocks sperm from entering the uterus, and the spermicide kills sperm. A diaphragm should be left in the vagina for 6-8 hours after sex and removed within 24 hours. A diaphragm is prescribed and fitted by a health care provider. A diaphragm should be replaced every 1-2 years, after giving birth, after gaining more than 15 lb (6.8 kg), and after pelvic surgery. Cervical cap A cervical cap is a round, soft latex or plastic cup that fits over the cervix. It is inserted into the vagina before sex, along with spermicide. It blocks sperm from entering the uterus. The cap should be left in place for 6-8 hours after sex and removed within 48 hours. A cervical cap must be prescribed and fitted by a health care provider. It should be replaced every 2 years. Sponge A sponge is a soft, circular piece of polyurethane foam with spermicide in it. The sponge helps block sperm from entering the uterus, and the spermicide kills sperm. To use it, you make it wet and then insert it into the vagina. It should be inserted before sex, left in for at least 6 hours after sex, and removed and thrown away within 30 hours. Spermicides Spermicides are chemicals that kill or block sperm from entering the   cervix and uterus. They can come as a cream, jelly, suppository, foam, or tablet. A spermicide should be inserted into the vagina with an applicator at least 10-15 minutes before sex to allow time for it to work. The process must be repeated every time you have sex. Spermicides do not require a prescription.   Intrauterine  contraception Intrauterine device (IUD) An IUD is a T-shaped device that is put in a woman's uterus. There are two types:  Hormone IUD.This type contains progestin, a synthetic form of the hormone progesterone. This type can stay in place for 3-5 years.  Copper IUD.This type is wrapped in copper wire. It can stay in place for 10 years. Permanent methods of contraception Female tubal ligation In this method, a woman's fallopian tubes are sealed, tied, or blocked during surgery to prevent eggs from traveling to the uterus. Hysteroscopic sterilization In this method, a small, flexible insert is placed into each fallopian tube. The inserts cause scar tissue to form in the fallopian tubes and block them, so sperm cannot reach an egg. The procedure takes about 3 months to be effective. Another form of birth control must be used during those 3 months. Female sterilization This is a procedure to tie off the tubes that carry sperm (vasectomy). After the procedure, the man can still ejaculate fluid (semen). Another form of birth control must be used for 3 months after the procedure. Natural planning methods Natural family planning In this method, a couple does not have sex on days when the woman could become pregnant. Calendar method In this method, the woman keeps track of the length of each menstrual cycle, identifies the days when pregnancy can happen, and does not have sex on those days. Ovulation method In this method, a couple avoids sex during ovulation. Symptothermal method This method involves not having sex during ovulation. The woman typically checks for ovulation by watching changes in her temperature and in the consistency of cervical mucus. Post-ovulation method In this method, a couple waits to have sex until after ovulation. Where to find more information  Centers for Disease Control and Prevention: www.cdc.gov Summary  Contraception, also called birth control, refers to methods or  devices that prevent pregnancy.  Hormonal methods of contraception include implants, injections, pills, patches, vaginal rings, and emergency contraceptives.  Barrier methods of contraception can include female condoms, female condoms, diaphragms, cervical caps, sponges, and spermicides.  There are two types of IUDs (intrauterine devices). An IUD can be put in a woman's uterus to prevent pregnancy for 3-5 years.  Permanent sterilization can be done through a procedure for males and females. Natural family planning methods involve nothaving sex on days when the woman could become pregnant. This information is not intended to replace advice given to you by your health care provider. Make sure you discuss any questions you have with your health care provider. Document Revised: 04/01/2020 Document Reviewed: 04/01/2020 Elsevier Patient Education  2021 Else[employer] Inc.   Breastfeeding  Choosing to breastfeed is one of the best decisions you can make for yourself and your baby. A change in hormones during pregnancy causes your breasts to make breast milk in your milk-producing glands. Hormones prevent breast milk from being released before your baby is born. They also prompt milk flow after birth. Once breastfeeding has begun, thoughts of your baby, as well as his or her sucking or crying, can stimulate the release of milk from your milk-producing glands. Benefits of breastfeeding Research shows that breastfeeding offers many health benefits   for infants and mothers. It also offers a cost-free and convenient way to feed your baby. For your baby  Your first milk (colostrum) helps your baby's digestive system to function better.  Special cells in your milk (antibodies) help your baby to fight off infections.  Breastfed babies are less likely to develop asthma, allergies, obesity, or type 2 diabetes. They are also at lower risk for sudden infant death syndrome (SIDS).  Nutrients in breast milk are better  able to meet your baby's needs compared to infant formula.  Breast milk improves your baby's brain development. For you  Breastfeeding helps to create a very special bond between you and your baby.  Breastfeeding is convenient. Breast milk costs nothing and is always available at the correct temperature.  Breastfeeding helps to burn calories. It helps you to lose the weight that you gained during pregnancy.  Breastfeeding makes your uterus return faster to its size before pregnancy. It also slows bleeding (lochia) after you give birth.  Breastfeeding helps to lower your risk of developing type 2 diabetes, osteoporosis, rheumatoid arthritis, cardiovascular disease, and breast, ovarian, uterine, and endometrial cancer later in life. Breastfeeding basics Starting breastfeeding  Find a comfortable place to sit or lie down, with your neck and back well-supported.  Place a pillow or a rolled-up blanket under your baby to bring him or her to the level of your breast (if you are seated). Nursing pillows are specially designed to help support your arms and your baby while you breastfeed.  Make sure that your baby's tummy (abdomen) is facing your abdomen.  Gently massage your breast. With your fingertips, massage from the outer edges of your breast inward toward the nipple. This encourages milk flow. If your milk flows slowly, you may need to continue this action during the feeding.  Support your breast with 4 fingers underneath and your thumb above your nipple (make the letter "C" with your hand). Make sure your fingers are well away from your nipple and your baby's mouth.  Stroke your baby's lips gently with your finger or nipple.  When your baby's mouth is open wide enough, quickly bring your baby to your breast, placing your entire nipple and as much of the areola as possible into your baby's mouth. The areola is the colored area around your nipple. ? More areola should be visible above your  baby's upper lip than below the lower lip. ? Your baby's lips should be opened and extended outward (flanged) to ensure an adequate, comfortable latch. ? Your baby's tongue should be between his or her lower gum and your breast.  Make sure that your baby's mouth is correctly positioned around your nipple (latched). Your baby's lips should create a seal on your breast and be turned out (everted).  It is common for your baby to suck about 2-3 minutes in order to start the flow of breast milk. Latching Teaching your baby how to latch onto your breast properly is very important. An improper latch can cause nipple pain, decreased milk supply, and poor weight gain in your baby. Also, if your baby is not latched onto your nipple properly, he or she may swallow some air during feeding. This can make your baby fussy. Burping your baby when you switch breasts during the feeding can help to get rid of the air. However, teaching your baby to latch on properly is still the best way to prevent fussiness from swallowing air while breastfeeding. Signs that your baby has successfully latched onto   your nipple  Silent tugging or silent sucking, without causing you pain. Infant's lips should be extended outward (flanged).  Swallowing heard between every 3-4 sucks once your milk has started to flow (after your let-down milk reflex occurs).  Muscle movement above and in front of his or her ears while sucking. Signs that your baby has not successfully latched onto your nipple  Sucking sounds or smacking sounds from your baby while breastfeeding.  Nipple pain. If you think your baby has not latched on correctly, slip your finger into the corner of your baby's mouth to break the suction and place it between your baby's gums. Attempt to start breastfeeding again. Signs of successful breastfeeding Signs from your baby  Your baby will gradually decrease the number of sucks or will completely stop sucking.  Your baby  will fall asleep.  Your baby's body will relax.  Your baby will retain a small amount of milk in his or her mouth.  Your baby will let go of your breast by himself or herself. Signs from you  Breasts that have increased in firmness, weight, and size 1-3 hours after feeding.  Breasts that are softer immediately after breastfeeding.  Increased milk volume, as well as a change in milk consistency and color by the fifth day of breastfeeding.  Nipples that are not sore, cracked, or bleeding. Signs that your baby is getting enough milk  Wetting at least 1-2 diapers during the first 24 hours after birth.  Wetting at least 5-6 diapers every 24 hours for the first week after birth. The urine should be clear or pale yellow by the age of 5 days.  Wetting 6-8 diapers every 24 hours as your baby continues to grow and develop.  At least 3 stools in a 24-hour period by the age of 5 days. The stool should be soft and yellow.  At least 3 stools in a 24-hour period by the age of 7 days. The stool should be seedy and yellow.  No loss of weight greater than 10% of birth weight during the first 3 days of life.  Average weight gain of 4-7 oz (113-198 g) per week after the age of 4 days.  Consistent daily weight gain by the age of 5 days, without weight loss after the age of 2 weeks. After a feeding, your baby may spit up a small amount of milk. This is normal. Breastfeeding frequency and duration Frequent feeding will help you make more milk and can prevent sore nipples and extremely full breasts (breast engorgement). Breastfeed when you feel the need to reduce the fullness of your breasts or when your baby shows signs of hunger. This is called "breastfeeding on demand." Signs that your baby is hungry include:  Increased alertness, activity, or restlessness.  Movement of the head from side to side.  Opening of the mouth when the corner of the mouth or cheek is stroked (rooting).  Increased  sucking sounds, smacking lips, cooing, sighing, or squeaking.  Hand-to-mouth movements and sucking on fingers or hands.  Fussing or crying. Avoid introducing a pacifier to your baby in the first 4-6 weeks after your baby is born. After this time, you may choose to use a pacifier. Research has shown that pacifier use during the first year of a baby's life decreases the risk of sudden infant death syndrome (SIDS). Allow your baby to feed on each breast as long as he or she wants. When your baby unlatches or falls asleep while feeding from the   first breast, offer the second breast. Because newborns are often sleepy in the first few weeks of life, you may need to awaken your baby to get him or her to feed. Breastfeeding times will vary from baby to baby. However, the following rules can serve as a guide to help you make sure that your baby is properly fed:  Newborns (babies 4 weeks of age or younger) may breastfeed every 1-3 hours.  Newborns should not go without breastfeeding for longer than 3 hours during the day or 5 hours during the night.  You should breastfeed your baby a minimum of 8 times in a 24-hour period. Breast milk pumping Pumping and storing breast milk allows you to make sure that your baby is exclusively fed your breast milk, even at times when you are unable to breastfeed. This is especially important if you go back to work while you are still breastfeeding, or if you are not able to be present during feedings. Your lactation consultant can help you find a method of pumping that works best for you and give you guidelines about how long it is safe to store breast milk.      Caring for your breasts while you breastfeed Nipples can become dry, cracked, and sore while breastfeeding. The following recommendations can help keep your breasts moisturized and healthy:  Avoid using soap on your nipples.  Wear a supportive bra designed especially for nursing. Avoid wearing underwire-style  bras or extremely tight bras (sports bras).  Air-dry your nipples for 3-4 minutes after each feeding.  Use only cotton bra pads to absorb leaked breast milk. Leaking of breast milk between feedings is normal.  Use lanolin on your nipples after breastfeeding. Lanolin helps to maintain your skin's normal moisture barrier. Pure lanolin is not harmful (not toxic) to your baby. You may also hand express a few drops of breast milk and gently massage that milk into your nipples and allow the milk to air-dry. In the first few weeks after giving birth, some women experience breast engorgement. Engorgement can make your breasts feel heavy, warm, and tender to the touch. Engorgement peaks within 3-5 days after you give birth. The following recommendations can help to ease engorgement:  Completely empty your breasts while breastfeeding or pumping. You may want to start by applying warm, moist heat (in the shower or with warm, water-soaked hand towels) just before feeding or pumping. This increases circulation and helps the milk flow. If your baby does not completely empty your breasts while breastfeeding, pump any extra milk after he or she is finished.  Apply ice packs to your breasts immediately after breastfeeding or pumping, unless this is too uncomfortable for you. To do this: ? Put ice in a plastic bag. ? Place a towel between your skin and the bag. ? Leave the ice on for 20 minutes, 2-3 times a day.  Make sure that your baby is latched on and positioned properly while breastfeeding. If engorgement persists after 48 hours of following these recommendations, contact your health care provider or a lactation consultant. Overall health care recommendations while breastfeeding  Eat 3 healthy meals and 3 snacks every day. Well-nourished mothers who are breastfeeding need an additional 450-500 calories a day. You can meet this requirement by increasing the amount of a balanced diet that you eat.  Drink  enough water to keep your urine pale yellow or clear.  Rest often, relax, and continue to take your prenatal vitamins to prevent fatigue, stress, and low   vitamin and mineral levels in your body (nutrient deficiencies).  Do not use any products that contain nicotine or tobacco, such as cigarettes and e-cigarettes. Your baby may be harmed by chemicals from cigarettes that pass into breast milk and exposure to secondhand smoke. If you need help quitting, ask your health care provider.  Avoid alcohol.  Do not use illegal drugs or marijuana.  Talk with your health care provider before taking any medicines. These include over-the-counter and prescription medicines as well as vitamins and herbal supplements. Some medicines that may be harmful to your baby can pass through breast milk.  It is possible to become pregnant while breastfeeding. If birth control is desired, ask your health care provider about options that will be safe while breastfeeding your baby. Where to find more information: [employer]: www.llli.org Contact a health care provider if:  You feel like you want to stop breastfeeding or have become frustrated with breastfeeding.  Your nipples are cracked or bleeding.  Your breasts are red, tender, or warm.  You have: ? Painful breasts or nipples. ? A swollen area on either breast. ? A fever or chills. ? Nausea or vomiting. ? Drainage other than breast milk from your nipples.  Your breasts do not become full before feedings by the fifth day after you give birth.  You feel sad and depressed.  Your baby is: ? Too sleepy to eat well. ? Having trouble sleeping. ? More than 1 week old and wetting fewer than 6 diapers in a 24-hour period. ? Not gaining weight by 5 days of age.  Your baby has fewer than 3 stools in a 24-hour period.  Your baby's skin or the white parts of his or her eyes become yellow. Get help right away if:  Your baby is overly tired  (lethargic) and does not want to wake up and feed.  Your baby develops an unexplained fever. Summary  Breastfeeding offers many health benefits for infant and mothers.  Try to breastfeed your infant when he or she shows early signs of hunger.  Gently tickle or stroke your baby's lips with your finger or nipple to allow the baby to open his or her mouth. Bring the baby to your breast. Make sure that much of the areola is in your baby's mouth. Offer one side and burp the baby before you offer the other side.  Talk with your health care provider or lactation consultant if you have questions or you face problems as you breastfeed. This information is not intended to replace advice given to you by your health care provider. Make sure you discuss any questions you have with your health care provider. Document Revised: 01/20/2018 Document Reviewed: 11/27/2016 Elsevier Patient Education  2021 Elsevier Inc.  

## 2021-02-07 LAB — URINE CULTURE, OB REFLEX

## 2021-02-07 LAB — CULTURE, OB URINE

## 2021-02-12 LAB — AFP, SERUM, OPEN SPINA BIFIDA
AFP MoM: 1.05
AFP Value: 41.5 ng/mL
Gest. Age on Collection Date: 16.3 weeks
Maternal Age At EDD: 25 yr
OSBR Risk 1 IN: 10000
Test Results:: NEGATIVE
Weight: 132 [lb_av]

## 2021-02-18 ENCOUNTER — Telehealth: Payer: Self-pay | Admitting: Lactation Services

## 2021-02-18 ENCOUNTER — Encounter: Payer: Self-pay | Admitting: General Practice

## 2021-02-18 ENCOUNTER — Telehealth: Payer: Self-pay | Admitting: General Practice

## 2021-02-18 NOTE — Telephone Encounter (Signed)
Called patient to inform her that her Horizon Carrier Screening shows she is a silent carrier for Alpha Thalassemia. She did not answer. Mailbox full so not able to leave a message. My Chart message sent.

## 2021-02-18 NOTE — Telephone Encounter (Signed)
Called patient regarding horizon results but then saw patient had already been informed. Patient asked about FOB horizon results and I informed the patient the results were not back yet but should be by her next appt. Reviewed upcoming appts with patient. Patient verbalized understanding.

## 2021-02-19 ENCOUNTER — Encounter: Payer: Self-pay | Admitting: *Deleted

## 2021-02-24 ENCOUNTER — Ambulatory Visit: Payer: Self-pay | Admitting: *Deleted

## 2021-02-24 ENCOUNTER — Encounter: Payer: Self-pay | Admitting: *Deleted

## 2021-02-24 ENCOUNTER — Other Ambulatory Visit: Payer: Self-pay | Admitting: Family Medicine

## 2021-02-24 ENCOUNTER — Other Ambulatory Visit: Payer: Self-pay

## 2021-02-24 ENCOUNTER — Ambulatory Visit: Payer: Self-pay | Attending: Family Medicine

## 2021-02-24 DIAGNOSIS — O09899 Supervision of other high risk pregnancies, unspecified trimester: Secondary | ICD-10-CM | POA: Insufficient documentation

## 2021-02-24 DIAGNOSIS — O0990 Supervision of high risk pregnancy, unspecified, unspecified trimester: Secondary | ICD-10-CM | POA: Insufficient documentation

## 2021-03-04 ENCOUNTER — Other Ambulatory Visit (HOSPITAL_COMMUNITY)
Admission: RE | Admit: 2021-03-04 | Discharge: 2021-03-04 | Disposition: A | Discharge status: Home / Self Care | Payer: Self-pay | Source: Ambulatory Visit | Attending: Advanced Practice Midwife | Admitting: Advanced Practice Midwife

## 2021-03-04 ENCOUNTER — Other Ambulatory Visit: Payer: Self-pay

## 2021-03-04 ENCOUNTER — Ambulatory Visit (INDEPENDENT_AMBULATORY_CARE_PROVIDER_SITE_OTHER): Payer: Self-pay | Admitting: Advanced Practice Midwife

## 2021-03-04 VITALS — BP 113/63 | HR 78 | Wt 134.4 lb

## 2021-03-04 DIAGNOSIS — N898 Other specified noninflammatory disorders of vagina: Secondary | ICD-10-CM | POA: Insufficient documentation

## 2021-03-04 DIAGNOSIS — O0992 Supervision of high risk pregnancy, unspecified, second trimester: Secondary | ICD-10-CM

## 2021-03-04 DIAGNOSIS — Z3A20 20 weeks gestation of pregnancy: Secondary | ICD-10-CM

## 2021-03-04 DIAGNOSIS — A568 Sexually transmitted chlamydial infection of other sites: Secondary | ICD-10-CM

## 2021-03-04 DIAGNOSIS — O98312 Other infections with a predominantly sexual mode of transmission complicating pregnancy, second trimester: Secondary | ICD-10-CM

## 2021-03-04 NOTE — Patient Instructions (Signed)

## 2021-03-04 NOTE — Progress Notes (Signed)
   PRENATAL VISIT NOTE  Subjective:  Monica [name] is a 25 y.o. [phone] at [redacted]w[redacted]d being seen today for ongoing prenatal care.  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  Patient reports two weeks of intermittent abnormal vaginal discharge.  Contractions: Not present. Vag. Bleeding: None.  Movement: Present. Denies leaking of fluid.   Patient states she was compliant with all patient education regarding her Chlamydia diagnosis. She states partner was treated 5 days before she was and they were abstinent for 15 days after her treatment.  The following portions of the patient's history were reviewed and updated as appropriate: allergies, current medications, past family history, past medical history, past social history, past surgical history and problem list. Problem list updated.  Objective:   Vitals:   03/04/21 1345  BP: 113/63  Pulse: 78  Weight: 134 lb 6.4 oz (61 kg)    Fetal Status: Fetal Heart Rate (bpm): 138   Movement: Present     General:  Alert, oriented and cooperative. Patient is in no acute distress.  Skin: Skin is warm and dry. No rash noted.   Cardiovascular: Normal heart rate noted  Respiratory: Normal respiratory effort, no problems with respiration noted  Abdomen: Soft, gravid, appropriate for gestational age.  Pain/Pressure: Absent     Pelvic: Cervical exam deferred        Extremities: Normal range of motion.  Edema: None  Mental Status: Normal mood and affect. Normal behavior. Normal judgment and thought content.   Assessment and Plan:  Pregnancy: [id number] at  [redacted]w[redacted]d  1. Supervision of high risk pregnancy in second trimester - Routine care  2. Vaginal discharge - Spec exam today, cervix visually closed, discharge c/w yeast. Will wait for swab result - Cervicovaginal ancillary only( [place])  3. Chlamydia trachomatis infection in mother during second trimester of pregnancy - TOC collected today   4. [redacted] weeks gestation of pregnancy  Preterm labor symptoms and general obstetric precautions including but not limited to vaginal bleeding, contractions, leaking of fluid and fetal movement were reviewed in detail with the patient. Please refer to After Visit Summary for other counseling recommendations.  Return in about 4 weeks (around 04/01/2021) for APP or MD.  Future Appointments  Date Time Provider Department Center  04/02/2021  1:55 PM Reva Bores, MD [facility]    Calvert Cantor, CNM

## 2021-03-04 NOTE — Progress Notes (Signed)
Patient complains of yellow vaginal discharge that started 2 weeks. States that it does not have an odor or have any discomfort when using the restroom.

## 2021-03-05 LAB — CERVICOVAGINAL ANCILLARY ONLY
Bacterial Vaginitis (gardnerella): NEGATIVE
Candida Glabrata: NEGATIVE
Candida Vaginitis: NEGATIVE
Chlamydia: NEGATIVE
Comment: NEGATIVE
Comment: NEGATIVE
Comment: NEGATIVE
Comment: NEGATIVE
Comment: NEGATIVE
Comment: NORMAL
Neisseria Gonorrhea: NEGATIVE
Trichomonas: NEGATIVE

## 2021-03-13 ENCOUNTER — Other Ambulatory Visit: Payer: Self-pay

## 2021-03-13 ENCOUNTER — Telehealth: Payer: Self-pay | Admitting: Family Medicine

## 2021-03-13 ENCOUNTER — Ambulatory Visit (INDEPENDENT_AMBULATORY_CARE_PROVIDER_SITE_OTHER): Payer: Self-pay

## 2021-03-13 ENCOUNTER — Other Ambulatory Visit (HOSPITAL_COMMUNITY)
Admission: RE | Admit: 2021-03-13 | Discharge: 2021-03-13 | Disposition: A | Discharge status: Home / Self Care | Payer: Self-pay | Source: Ambulatory Visit | Attending: Family Medicine | Admitting: Family Medicine

## 2021-03-13 VITALS — BP 98/55 | HR 68 | Ht 63.0 in | Wt 136.9 lb

## 2021-03-13 DIAGNOSIS — N898 Other specified noninflammatory disorders of vagina: Secondary | ICD-10-CM | POA: Insufficient documentation

## 2021-03-13 NOTE — Progress Notes (Signed)
Pt states having vaginal discharge and fishy odor x 2 weeks now. Pt also states having mild cramps, but denies any vaginal bleeding or spotting. Has not been exposed STDs. Denies vaginal itching.  +FM. Pt has hx of Chlamydia. Next prenatal visit on 04/02/21.  Self swab collected today. Pt advised results will take 24-48 hours and will see results in mychart and will be notified if needs further treatment. Pt verbalized understanding.  Judeth Cornfield, RN  03/13/21

## 2021-03-13 NOTE — Telephone Encounter (Signed)
Error

## 2021-03-14 ENCOUNTER — Other Ambulatory Visit: Payer: Self-pay | Admitting: Family Medicine

## 2021-03-14 DIAGNOSIS — B9689 Other specified bacterial agents as the cause of diseases classified elsewhere: Secondary | ICD-10-CM

## 2021-03-14 LAB — CERVICOVAGINAL ANCILLARY ONLY
Bacterial Vaginitis (gardnerella): POSITIVE — AB
Candida Glabrata: NEGATIVE
Candida Vaginitis: NEGATIVE
Chlamydia: NEGATIVE
Comment: NEGATIVE
Comment: NEGATIVE
Comment: NEGATIVE
Comment: NEGATIVE
Comment: NEGATIVE
Comment: NORMAL
Neisseria Gonorrhea: NEGATIVE
Trichomonas: NEGATIVE

## 2021-03-14 MED ORDER — METRONIDAZOLE 500 MG PO TABS
500.0000 mg | ORAL_TABLET | Freq: Two times a day (BID) | ORAL | 0 refills | Status: AC
Start: 2021-03-14 — End: 2021-03-21

## 2021-03-14 NOTE — Progress Notes (Signed)
Rx for flagyl sent to pharmacy. Patient notified of results via myChart.  Alric Seton, MD OB Fellow, Faculty [facility], Center for [facility] 03/14/2021 3:30 PM

## 2021-04-02 ENCOUNTER — Ambulatory Visit (INDEPENDENT_AMBULATORY_CARE_PROVIDER_SITE_OTHER): Payer: Self-pay | Admitting: Family Medicine

## 2021-04-02 ENCOUNTER — Other Ambulatory Visit: Payer: Self-pay

## 2021-04-02 ENCOUNTER — Encounter: Payer: Self-pay | Admitting: Family Medicine

## 2021-04-02 VITALS — BP 97/54 | HR 70 | Wt 139.0 lb

## 2021-04-02 DIAGNOSIS — O0992 Supervision of high risk pregnancy, unspecified, second trimester: Secondary | ICD-10-CM

## 2021-04-02 DIAGNOSIS — O09899 Supervision of other high risk pregnancies, unspecified trimester: Secondary | ICD-10-CM

## 2021-04-02 NOTE — Progress Notes (Signed)
   PRENATAL VISIT NOTE  Subjective:  Monica [name] is a 25 y.o. [phone] at [redacted]w[redacted]d being seen today for ongoing prenatal care.  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  Patient reports no complaints.  Contractions: Not present. Vag. Bleeding: None.  Movement: Present. Denies leaking of fluid.   The following portions of the patient's history were reviewed and updated as appropriate: allergies, current medications, past family history, past medical history, past social history, past surgical history and problem list.   Objective:   Vitals:   04/02/21 1410  BP: (!) 97/54  Pulse: 70  Weight: 139 lb (63 kg)    Fetal Status: Fetal Heart Rate (bpm): 150 Fundal Height: 24 cm Movement: Present     General:  Alert, oriented and cooperative. Patient is in no acute distress.  Skin: Skin is warm and dry. No rash noted.   Cardiovascular: Normal heart rate noted  Respiratory: Normal respiratory effort, no problems with respiration noted  Abdomen: Soft, gravid, appropriate for gestational age.  Pain/Pressure: Present     Pelvic: Cervical exam deferred        Extremities: Normal range of motion.  Edema: None  Mental Status: Normal mood and affect. Normal behavior. Normal judgment and thought content.   Assessment and Plan:  Pregnancy: [id number] at [redacted]w[redacted]d 1. Supervision of high risk pregnancy in second trimester Continue routine prenatal care.  2. History of preterm delivery, currently pregnant Continue routine prenatal care.   Preterm labor symptoms and general obstetric precautions including but  not limited to vaginal bleeding, contractions, leaking of fluid and fetal movement were reviewed in detail with the patient. Please refer to After Visit Summary for other counseling recommendations.   Return in 4 weeks (on 04/30/2021) for in person, [facility], 28 wk labs.  Future Appointments  Date Time Provider Department Center  05/01/2021  8:20 AM [facility]  05/01/2021  9:35 AM [name], CNM [facility]    Reva Bores, MD

## 2021-04-02 NOTE — Patient Instructions (Signed)

## 2021-04-16 ENCOUNTER — Inpatient Hospital Stay (HOSPITAL_COMMUNITY)
Admission: AD | Admit: 2021-04-16 | Discharge: 2021-04-16 | Disposition: A | Discharge status: Home / Self Care | Payer: Self-pay | Attending: Obstetrics and Gynecology | Admitting: Obstetrics and Gynecology

## 2021-04-16 ENCOUNTER — Encounter (HOSPITAL_COMMUNITY): Payer: Self-pay | Admitting: Obstetrics and Gynecology

## 2021-04-16 ENCOUNTER — Other Ambulatory Visit: Payer: Self-pay

## 2021-04-16 DIAGNOSIS — Z87891 Personal history of nicotine dependence: Secondary | ICD-10-CM | POA: Insufficient documentation

## 2021-04-16 DIAGNOSIS — O368120 Decreased fetal movements, second trimester, not applicable or unspecified: Secondary | ICD-10-CM

## 2021-04-16 DIAGNOSIS — Z0371 Encounter for suspected problem with amniotic cavity and membrane ruled out: Secondary | ICD-10-CM

## 2021-04-16 DIAGNOSIS — O26892 Other specified pregnancy related conditions, second trimester: Secondary | ICD-10-CM

## 2021-04-16 DIAGNOSIS — R109 Unspecified abdominal pain: Secondary | ICD-10-CM

## 2021-04-16 DIAGNOSIS — Z3689 Encounter for other specified antenatal screening: Secondary | ICD-10-CM

## 2021-04-16 DIAGNOSIS — Z3A26 26 weeks gestation of pregnancy: Secondary | ICD-10-CM

## 2021-04-16 LAB — WET PREP, GENITAL
Clue Cells Wet Prep HPF POC: NONE SEEN
Sperm: NONE SEEN
Trich, Wet Prep: NONE SEEN
Yeast Wet Prep HPF POC: NONE SEEN

## 2021-04-16 LAB — URINALYSIS, ROUTINE W REFLEX MICROSCOPIC
Bilirubin Urine: NEGATIVE
Glucose, UA: NEGATIVE mg/dL
Ketones, ur: NEGATIVE mg/dL
Leukocytes,Ua: NEGATIVE
Nitrite: NEGATIVE
Protein, ur: NEGATIVE mg/dL
Specific Gravity, Urine: 1.02 (ref 1.005–1.030)
pH: 6 (ref 5.0–8.0)

## 2021-04-16 NOTE — Discharge Instructions (Signed)
Warning Signs During Pregnancy During pregnancy, your body goes through many changes. Some changes may be uncomfortable, but most do not represent a serious problem. However, it is important to learn when certain signs and symptoms may indicate a problem. Talk with your health care provider about your current health and any medical conditions you have. Make sure you know the symptoms to watch for and report. How does this affect me? Warning signs during pregnancy Let your health care provider know if you have any of the following warning signs:  Dizziness or feeling faint.  Nausea, vomiting, or diarrhea that lasts 24 hours or longer.  Spotting or bleeding from your vagina.  Abdominal cramping or pain in your pelvis or lower back.  Shortness of breath, difficulty breathing, or chest pain.  New or increased pain, swelling, or redness in an arm or leg.  Your baby is moving less than usual or is not moving. You should also watch for signs of a serious medical condition called preeclampsia. This may include:  A severe, throbbing headache that does not go away.  Vision changes, such as blurred or double vision, light sensitivity, or seeing spots in front of your eyes.  Sudden or extreme swelling of your face, hands, legs, or feet. Pregnancy causes changes that may make it more likely for you to get an infection. Let your health care provider know if you have signs of infection, such as:  A fever.  A bad-smelling vaginal discharge.  Pain or burning when you urinate. How does this affect my baby? Throughout your pregnancy, always report any of the warning signs of a problem to your health care provider. This can help prevent complications that may affect your baby, including:  Increased risk for premature birth.  Infection that may be transmitted to your baby.  Increased risk for stillbirth. Follow these instructions at home:  Take over-the-counter and prescription medicines only  as told by your health care provider.  Keep all follow-up visits. This is important.   Where to find more information  Office on Women's Health: womenshealth.gov/pregnancy/  American College of Obstetricians and Gynecologists: acog.org/womens-health/pregnancy Contact a health care provider if:  You have any warning signs of problems during your pregnancy.  Any of the following apply to you during your pregnancy: ? You have strong emotions, such as sadness or anxiety, that interfere with work or personal relationships. ? You feel unsafe in your home. ? You are using tobacco products, alcohol, or drugs, and you need help to stop. Get help right away if:  You have signs or symptoms of labor before 37 weeks of pregnancy. These include: ? Contractions that are 5 minutes or less apart, or that increase in frequency, intensity, or length. ? Sudden, sharp abdominal pain or low back pain. ? Uncontrolled gush or trickle of fluid from your vagina. Summary  Always report any warning signs to your health care provider to prevent complications that may affect both you and your baby.  Talk with your health care provider about your current health and any medical conditions you have. Make sure you know the symptoms to watch for and report.  Keep all follow-up visits. This is important. This information is not intended to replace advice given to you by your health care provider. Make sure you discuss any questions you have with your health care provider. Document Revised: 04/03/2020 Document Reviewed: 03/01/2020 Elsevier Patient Education  2021 Elsevier Inc.  

## 2021-04-16 NOTE — MAU Note (Signed)
Monica [name] Cottie Banda is a 25 y.o. at [redacted]w[redacted]d here in MAU reporting: last night noticed some bleeding and thinks her mucus plug may have come out. States no bleeding today. This AM noticed some LOF while she was getting dressed. States fluid was brown and yellow and watery. DFM since last night. Having some cramping.   Onset of complaint: today  Pain score: 5/10  Vitals:   04/16/21 1634  BP: 102/68  Pulse: 95  Resp: 16  Temp: 98.2 F (36.8 C)  SpO2: 98%     OEV:[id number] to doppler due to patient clothes, pt wearing a form fitted jumpsuit  Lab orders placed from triage: UA

## 2021-04-16 NOTE — MAU Provider Note (Signed)
History     CSN: [id number]  Arrival date and time: 04/16/21 1618   Event Date/Time   First Provider Initiated Contact with Patient 04/16/21 1708      Chief Complaint  Patient presents with  . Decreased Fetal Movement  . Rupture of Membranes  . Abdominal Pain   HPI Monica [name] is a 25 y.o. [phone] at [redacted]w[redacted]d who presents with abdominal cramping, decreased fetal movement, and vaginal discharge. Saw some spotting on toilet paper last night. No bleeding today. Had intercourse 2 days ago. This morning notice yellow, watery discharge x 2 episode. No odor or itching/irritation. Leaking has not continued. Had some decreased fetal movement but thinks the baby is moving again.  Intermittent lower abdominal cramping. Rates pain 5/10. Hasn't treated symptoms. Denies n/v/d, dysuria.   OB History    Gravida  4   Para  2   Term  1   Preterm  1   AB  1   Living  2     SAB  1   IAB  0   Ectopic  0   Multiple  0   Live Births  2           Past Medical History:  Diagnosis Date  . Preterm labor     Past Surgical History:  Procedure Laterality Date  . APPENDECTOMY      Family History  Problem Relation Age of Onset  . Diabetes Maternal Grandfather   . Healthy Mother   . Healthy Father     Social History   Tobacco Use  . Smoking status: Former Smoker    Types: Cigarettes    Quit date: 01/08/2020    Years since quitting: 1.2  . Smokeless tobacco: Never Used  Vaping Use  . Vaping Use: Never used  Substance Use Topics  . Alcohol use: Not Currently    Comment: occasionally  . Drug use: Never    Allergies: No Known Allergies  Medications Prior to Admission  Medication Sig Dispense Refill Last Dose  . Prenatal Vit-Fe Fumarate-FA (PRENATAL VITAMINS) 28-0.8 MG TABS Take 1 tablet by mouth daily. 30 tablet 11 04/16/2021 at Unknown time    Review of Systems  Constitutional: Negative.   Gastrointestinal: Positive for abdominal pain. Negative for  constipation, diarrhea, nausea and vomiting.  Genitourinary: Positive for vaginal discharge. Negative for dysuria and vaginal bleeding.   Physical Exam   Blood pressure 102/68, pulse 95, temperature 98.2 F (36.8 C), temperature source Oral, resp. rate 16, height 5\' 3"  (1.6 m), weight 62.5 kg, last menstrual period 10/13/2020, SpO2 98 %.  Physical Exam Vitals and nursing note reviewed. Exam conducted with a chaperone present.  Constitutional:      General: She is not in acute distress.    Appearance: She is well-developed.  HENT:     Head: Normocephalic and atraumatic.  Eyes:     General: No scleral icterus. Pulmonary:     Effort: Pulmonary effort is normal. No respiratory distress.  Abdominal:     Palpations: Abdomen is soft.     Tenderness: There is no abdominal tenderness.  Genitourinary:    Comments: Sterile Spec Exam performed: no blood, small amount of tan discharge. No pooling of fluid.   Dilation: Closed Effacement (%): Thick Cervical Position: Posterior Station: Ballotable Exam by:: [email or web address], NP  Skin:    General: Skin is warm and dry.  Neurological:     Mental Status: She is alert.  Psychiatric:  Mood and Affect: Mood normal.        Behavior: Behavior normal.    NST:  Baseline: 145 bpm, Variability: Good {> 6 bpm), Accelerations: Reactive and Decelerations: Absent  MAU Course  Procedures Results for orders placed or performed during the hospital encounter of 04/16/21 (from the past 24 hour(s))  Wet prep, genital     Status: Abnormal   Collection Time: 04/16/21  5:17 PM   Specimen: PATH Cytology Cervicovaginal Ancillary Only  Result Value Ref Range   Yeast Wet Prep HPF POC NONE SEEN NONE SEEN   Trich, Wet Prep NONE SEEN NONE SEEN   Clue Cells Wet Prep HPF POC NONE SEEN NONE SEEN   WBC, Wet Prep HPF POC MANY (A) NONE SEEN   Sperm NONE SEEN   Urinalysis, Routine w reflex microscopic Urine, Clean Catch     Status: Abnormal   Collection Time:  04/16/21  5:18 PM  Result Value Ref Range   Color, Urine YELLOW YELLOW   APPearance CLEAR CLEAR   Specific Gravity, Urine 1.020 1.005 - 1.030   pH 6.0 5.0 - 8.0   Glucose, UA NEGATIVE NEGATIVE mg/dL   Hgb urine dipstick SMALL (A) NEGATIVE   Bilirubin Urine NEGATIVE NEGATIVE   Ketones, ur NEGATIVE NEGATIVE mg/dL   Protein, ur NEGATIVE NEGATIVE mg/dL   Nitrite NEGATIVE NEGATIVE   Leukocytes,Ua NEGATIVE NEGATIVE   RBC / HPF 0-5 0 - 5 RBC/hpf   WBC, UA 0-5 0 - 5 WBC/hpf   Bacteria, UA RARE (A) NONE SEEN   Squamous Epithelial / LPF 0-5 0 - 5   Mucus PRESENT     MDM Reactive tracing. Fetal movement heard & palpated.   Reports leaking of fluid after episode of spotting last night. She is RH positive. Sterile spec exam performed. Minimal amount of tan discharge. No blood or pooling. Fern negative.  Cervix checked due to complaint of abdominal cramping. No regular contractions on monitor & cervix closed.    Assessment and Plan   1. Encounter for suspected PROM, with rupture of membranes not found   2. Abdominal cramping affecting pregnancy   3. Decreased fetal movements in second trimester, single or unspecified fetus   4. NST (non-stress test) reactive   5. [redacted] weeks gestation of pregnancy    -reviewed reasons to return to MAU -GC/CT pending -f/u with OB  Monica [name] 04/16/2021, 6:35 PM

## 2021-04-17 LAB — GC/CHLAMYDIA PROBE AMP (~~LOC~~) NOT AT ARMC
Chlamydia: NEGATIVE
Comment: NEGATIVE
Comment: NORMAL
Neisseria Gonorrhea: NEGATIVE

## 2021-04-28 ENCOUNTER — Other Ambulatory Visit: Payer: Self-pay

## 2021-04-28 DIAGNOSIS — O0993 Supervision of high risk pregnancy, unspecified, third trimester: Secondary | ICD-10-CM

## 2021-05-01 ENCOUNTER — Ambulatory Visit (INDEPENDENT_AMBULATORY_CARE_PROVIDER_SITE_OTHER): Payer: Self-pay | Admitting: Student

## 2021-05-01 ENCOUNTER — Other Ambulatory Visit: Payer: Self-pay

## 2021-05-01 ENCOUNTER — Encounter: Payer: Self-pay | Admitting: Student

## 2021-05-01 VITALS — BP 104/75 | HR 89 | Wt 143.7 lb

## 2021-05-01 DIAGNOSIS — A568 Sexually transmitted chlamydial infection of other sites: Secondary | ICD-10-CM

## 2021-05-01 DIAGNOSIS — O0990 Supervision of high risk pregnancy, unspecified, unspecified trimester: Secondary | ICD-10-CM

## 2021-05-01 DIAGNOSIS — O98311 Other infections with a predominantly sexual mode of transmission complicating pregnancy, first trimester: Secondary | ICD-10-CM

## 2021-05-01 DIAGNOSIS — Z23 Encounter for immunization: Secondary | ICD-10-CM

## 2021-05-01 DIAGNOSIS — O0993 Supervision of high risk pregnancy, unspecified, third trimester: Secondary | ICD-10-CM

## 2021-05-01 DIAGNOSIS — Z3A28 28 weeks gestation of pregnancy: Secondary | ICD-10-CM

## 2021-05-01 NOTE — Progress Notes (Signed)
Patient ID: Monica [name], female   DOB: 1996-08-15, 25 y.o.   MRN: [id number]   PRENATAL VISIT NOTE  Subjective:  Monica [name] is a 25 y.o. [id number] at [redacted]w[redacted]d being seen today for ongoing prenatal care.  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  Patient reports  hip pain on the side. Hurts to move or change position, get up and walk .  Contractions: Not present. Vag. Bleeding: None.  Movement: Present. Denies leaking of fluid.   The following portions of the patient's history were reviewed and updated as appropriate: allergies, current medications, past family history, past medical history, past social history, past surgical history and problem list.   Objective:   Vitals:   05/01/21 0945  BP: 104/75  Pulse: 89  Weight: 143 lb 11.2 oz (65.2 kg)    Fetal Status: Fetal Heart Rate (bpm): 130 Fundal Height: 27 cm Movement: Present     General:  Alert, oriented and cooperative. Patient is in no acute distress.  Skin: Skin is warm and dry. No rash noted.   Cardiovascular: Normal heart rate noted  Respiratory: Normal respiratory effort, no problems with respiration noted  Abdomen: Soft, gravid, appropriate for gestational age.  Pain/Pressure: Present     Pelvic: Cervical exam deferred        Extremities: Normal range of motion.  Edema: None  Mental Status: Normal mood and affect. Normal behavior. Normal judgment and thought content.   Assessment and Plan:  Pregnancy: [id number] at [redacted]w[redacted]d 1. Chlamydia trachomatis infection in mother during first trimester of pregnancy -no  signs/symptoms of chlamydia  2. [redacted] weeks gestation of pregnancy -glucose test in process . Reviewed risks of uncontrolled diabetes in pregnancy; reviewed that we will let her know if she has GDM and will set up her appts, testing etc.  - Tdap vaccine greater than or equal to 7yo IM  3. Supervision of high risk pregnancy, antepartum -call [facility] about alpha thal results -discussed comfort measures for RLP; recommend maternity support belt and hydrotherapy  -discussed IUD--patient has not had epidural in the past and she wants IUD-unsure if she will get at delivery or PP. Does not want anymore kids in the next 5 years.   Preterm labor symptoms and general obstetric precautions including but not limited to vaginal bleeding, contractions, leaking of fluid and fetal movement were reviewed in detail with the patient. Please refer to After Visit Summary for other counseling recommendations.   Return in about 2 weeks (around 05/15/2021), or LROB with KK.  No future appointments.  Monica [name], CNM

## 2021-05-01 NOTE — Progress Notes (Signed)
Patient complains of both sides of body being "sore" when she sits

## 2021-05-02 LAB — CBC
Hematocrit: 32.1 % — ABNORMAL LOW (ref 34.0–46.6)
Hemoglobin: 10.9 g/dL — ABNORMAL LOW (ref 11.1–15.9)
MCH: 29.2 pg (ref 26.6–33.0)
MCHC: 34 g/dL (ref 31.5–35.7)
MCV: 86 fL (ref 79–97)
Platelets: 240 10*3/uL (ref 150–450)
RBC: 3.73 x10E6/uL — ABNORMAL LOW (ref 3.77–5.28)
RDW: 13.3 % (ref 11.7–15.4)
WBC: 7.8 10*3/uL (ref 3.4–10.8)

## 2021-05-02 LAB — HIV ANTIBODY (ROUTINE TESTING W REFLEX): HIV Screen 4th Generation wRfx: NONREACTIVE

## 2021-05-02 LAB — GLUCOSE TOLERANCE, 2 HOURS W/ 1HR
Glucose, 1 hour: 171 mg/dL (ref 65–179)
Glucose, 2 hour: 77 mg/dL (ref 65–152)
Glucose, Fasting: 75 mg/dL (ref 65–91)

## 2021-05-02 LAB — RPR: RPR Ser Ql: NONREACTIVE

## 2021-05-04 ENCOUNTER — Other Ambulatory Visit: Payer: Self-pay | Admitting: Student

## 2021-05-04 ENCOUNTER — Encounter: Payer: Self-pay | Admitting: Student

## 2021-05-04 DIAGNOSIS — O99019 Anemia complicating pregnancy, unspecified trimester: Secondary | ICD-10-CM | POA: Insufficient documentation

## 2021-05-04 MED ORDER — FERROUS FUMARATE-FOLIC ACID 324-1 MG PO TABS: 1.0000 | ORAL_TABLET | ORAL | 1 refills | Status: DC

## 2021-05-20 ENCOUNTER — Other Ambulatory Visit (HOSPITAL_COMMUNITY)
Admission: RE | Admit: 2021-05-20 | Discharge: 2021-05-20 | Disposition: A | Discharge status: Home / Self Care | Payer: Self-pay | Source: Ambulatory Visit | Attending: Student | Admitting: Student

## 2021-05-20 ENCOUNTER — Other Ambulatory Visit: Payer: Self-pay

## 2021-05-20 ENCOUNTER — Ambulatory Visit (INDEPENDENT_AMBULATORY_CARE_PROVIDER_SITE_OTHER): Payer: Self-pay | Admitting: Student

## 2021-05-20 VITALS — BP 97/62 | HR 86 | Wt 144.3 lb

## 2021-05-20 DIAGNOSIS — N898 Other specified noninflammatory disorders of vagina: Secondary | ICD-10-CM | POA: Insufficient documentation

## 2021-05-20 DIAGNOSIS — Z3A31 31 weeks gestation of pregnancy: Secondary | ICD-10-CM

## 2021-05-20 DIAGNOSIS — D563 Thalassemia minor: Secondary | ICD-10-CM

## 2021-05-20 DIAGNOSIS — O0990 Supervision of high risk pregnancy, unspecified, unspecified trimester: Secondary | ICD-10-CM

## 2021-05-20 NOTE — Progress Notes (Signed)
Patient ID: Hayley Horn, female   DOB: [birth year], 25 y.o.   MRN: [id number]   PRENATAL VISIT NOTE  Subjective:  Zimal Weisensel is a 25 y.o. [id number] at [redacted]w[redacted]d being seen today for ongoing prenatal care.  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  Patient reports  itching for about a week. No odor, no color to her discharge. It started before her last intercourse; last IC was on Sunday. She reports some spotting after IC but it resolved.  .  Contractions: Not present. Vag. Bleeding: Small.  Movement: Present. Denies leaking of fluid.   The following portions of the patient's history were reviewed and updated as appropriate: allergies, current medications, past family history, past medical history, past social history, past surgical history and problem list.   Objective:   Vitals:   05/20/21 0822  BP: 97/62  Pulse: 86  Weight: 144 lb 4.8 oz (65.5 kg)    Fetal Status: Fetal Heart Rate (bpm): 140 Fundal Height: 32 cm Movement: Present     General:  Alert, oriented and cooperative. Patient is in no acute distress.  Skin: Skin is warm and dry. No rash noted.   Cardiovascular: Normal heart rate noted  Respiratory: Normal respiratory effort, no problems with respiration noted  Abdomen: Soft, gravid, appropriate for gestational age.  Pain/Pressure: Absent     Pelvic: Cervical exam deferred        Extremities: Normal range of motion.  Edema: None  Mental Status: Normal mood and affect. Normal behavior. Normal judgment and thought content.   Assessment and Plan:   Pregnancy: [id number] at [redacted]w[redacted]d 1. Vaginal itching -will do wet prep today and GC CT> patient does not think she is at risk of STI but wants to be sure -reassured that growth is appropriate and no signs of labor -reviewed genetic testing results; partner did cheek swab and it was negative  -keep taking iron every other day Preterm labor symptoms and general obstetric precautions including but not limited to vaginal bleeding, contractions, leaking of fluid and fetal movement were reviewed in detail with the patient. Please refer to After Visit Summary for other counseling recommendations.   Return in about 2 weeks (around 06/03/2021), or LROB with KK.  No future appointments.  [name], CNM

## 2021-05-20 NOTE — Progress Notes (Signed)
Patient complains of vaginal itching and stated that she did have vaginal bleeding this past Sunday after having sexual intercourse

## 2021-05-21 ENCOUNTER — Telehealth: Payer: Self-pay

## 2021-05-21 DIAGNOSIS — B379 Candidiasis, unspecified: Secondary | ICD-10-CM

## 2021-05-21 LAB — CERVICOVAGINAL ANCILLARY ONLY
Bacterial Vaginitis (gardnerella): NEGATIVE
Candida Glabrata: NEGATIVE
Candida Vaginitis: POSITIVE — AB
Chlamydia: NEGATIVE
Comment: NEGATIVE
Comment: NEGATIVE
Comment: NEGATIVE
Comment: NEGATIVE
Comment: NEGATIVE
Comment: NORMAL
Neisseria Gonorrhea: NEGATIVE
Trichomonas: NEGATIVE

## 2021-05-21 MED ORDER — TERCONAZOLE 0.4 % VA CREA: 1.0000 | TOPICAL_CREAM | Freq: Every day | VAGINAL | 0 refills | Status: DC

## 2021-05-21 NOTE — Telephone Encounter (Signed)
Patient called nurse phone requesting results from visit yesterday. Per chart review, vaginal swab result positive for yeast infection. Pt endorses vaginal itching and vaginal discharge with abnormal odor. Terazol vaginal cream ordered per protocol.

## 2021-06-02 ENCOUNTER — Other Ambulatory Visit: Payer: Self-pay

## 2021-06-02 ENCOUNTER — Ambulatory Visit (INDEPENDENT_AMBULATORY_CARE_PROVIDER_SITE_OTHER): Payer: Self-pay | Admitting: Student

## 2021-06-02 VITALS — BP 97/64 | HR 99 | Wt 144.3 lb

## 2021-06-02 DIAGNOSIS — Z3493 Encounter for supervision of normal pregnancy, unspecified, third trimester: Secondary | ICD-10-CM

## 2021-06-02 DIAGNOSIS — Z3A33 33 weeks gestation of pregnancy: Secondary | ICD-10-CM

## 2021-06-02 NOTE — Progress Notes (Signed)
   PRENATAL VISIT NOTE  Subjective:  Monica [name] is a 25 y.o. [phone] at [redacted]w[redacted]d being seen today for ongoing prenatal care.  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  Patient reports  pain in her lowerback that spreads around to the front of her thighs. She has some blurry vision.    She has blurry vision when she drives or when she is outside. It doesn't happen in her house  It happens when she is the driver; when she rides in the car and see other cars her vision does not get blurry.  Her yeast infection is resolved. Contractions: Irritability. Vag. Bleeding: None.  Movement: Present. Denies leaking of fluid.   The following portions of the patient's history were reviewed and updated as appropriate: allergies, current medications, past family history, past medical history, past social history, past surgical history and problem list.   Objective:   Vitals:   06/02/21 1620  BP: 97/64  Pulse: 99  Weight: 144 lb 4.8 oz (65.5 kg)    Fetal Status: Fetal Heart Rate (bpm): 144 Fundal Height: 33 cm Movement: Present     General:  Alert, oriented and cooperative. Patient is in no acute distress.  Skin: Skin is warm and dry. No rash noted.   Cardiovascular: Normal heart rate noted  Respiratory: Normal respiratory effort, no problems with respiration noted  Abdomen: Soft, gravid, appropriate for gestational age.  Pain/Pressure: Present     Pelvic: Cervical exam deferred        Extremities: Normal range of motion.  Edema: None  Mental Status: Normal mood and affect. Normal  behavior. Normal judgment and thought content.   Assessment and Plan:  Pregnancy: [id number] at [redacted]w[redacted]d  1. [redacted] weeks gestation of pregnancy   -reassurance given about normalcy of changes in vision in pregnancy, reassured that without elevated BP, we are not as concerned about her blurry vision. Her vision changes also seem to be related to driving. Recommend vision check if she is concerned, try to watch for triggers, recommend water -recommend massage, chiropracter for her leg, reassurance given -reviewed 36 week visit  Preterm labor symptoms and general obstetric precautions including but not limited to vaginal bleeding, contractions, leaking of fluid and fetal movement were reviewed in detail with the patient. Please refer to After Visit Summary for other counseling recommendations.   Return in about 3 weeks (around 06/23/2021), or LROB.  Future Appointments  Date Time Provider Department Center  06/30/2021 10:35 AM Marvetta Gibbons, Brand Males, NP [facility]    [name], [place]

## 2021-06-02 NOTE — Progress Notes (Signed)
Patient complains of pain on right hip and leg. She stated that it started earlier this month but pain has increased since then. Patient also complains of blurry vision along with headaches

## 2021-06-17 ENCOUNTER — Inpatient Hospital Stay (HOSPITAL_COMMUNITY)
Admission: AD | Admit: 2021-06-17 | Discharge: 2021-06-17 | Disposition: A | Discharge status: Home / Self Care | Payer: Self-pay | Attending: Family Medicine | Admitting: Family Medicine

## 2021-06-17 ENCOUNTER — Encounter (HOSPITAL_COMMUNITY): Payer: Self-pay | Admitting: Family Medicine

## 2021-06-17 DIAGNOSIS — Z87891 Personal history of nicotine dependence: Secondary | ICD-10-CM | POA: Insufficient documentation

## 2021-06-17 DIAGNOSIS — B9689 Other specified bacterial agents as the cause of diseases classified elsewhere: Secondary | ICD-10-CM | POA: Insufficient documentation

## 2021-06-17 DIAGNOSIS — O99891 Other specified diseases and conditions complicating pregnancy: Secondary | ICD-10-CM

## 2021-06-17 DIAGNOSIS — N760 Acute vaginitis: Secondary | ICD-10-CM

## 2021-06-17 DIAGNOSIS — O479 False labor, unspecified: Secondary | ICD-10-CM

## 2021-06-17 DIAGNOSIS — R109 Unspecified abdominal pain: Secondary | ICD-10-CM | POA: Insufficient documentation

## 2021-06-17 DIAGNOSIS — O23593 Infection of other part of genital tract in pregnancy, third trimester: Secondary | ICD-10-CM | POA: Insufficient documentation

## 2021-06-17 DIAGNOSIS — Z3A35 35 weeks gestation of pregnancy: Secondary | ICD-10-CM | POA: Insufficient documentation

## 2021-06-17 DIAGNOSIS — O4703 False labor before 37 completed weeks of gestation, third trimester: Secondary | ICD-10-CM | POA: Insufficient documentation

## 2021-06-17 LAB — WET PREP, GENITAL
Sperm: NONE SEEN
Trich, Wet Prep: NONE SEEN
Yeast Wet Prep HPF POC: NONE SEEN

## 2021-06-17 LAB — URINALYSIS, ROUTINE W REFLEX MICROSCOPIC
Bilirubin Urine: NEGATIVE
Glucose, UA: NEGATIVE mg/dL
Hgb urine dipstick: NEGATIVE
Ketones, ur: NEGATIVE mg/dL
Leukocytes,Ua: NEGATIVE
Nitrite: NEGATIVE
Protein, ur: NEGATIVE mg/dL
Specific Gravity, Urine: 1.006 (ref 1.005–1.030)
pH: 7 (ref 5.0–8.0)

## 2021-06-17 MED ORDER — METRONIDAZOLE 500 MG PO TABS: 500.0000 mg | ORAL_TABLET | Freq: Two times a day (BID) | ORAL | 0 refills | Status: DC

## 2021-06-17 NOTE — MAU Provider Note (Signed)
History     CSN: [id number]  Arrival date and time: 06/17/21 1856   Event Date/Time   First Provider Initiated Contact with Patient 06/17/21 1942      Chief Complaint  Patient presents with   Abdominal Cramping   Vaginal Discharge   HPI Monica [name] is a 25 y.o. [id number] at [redacted]w[redacted]d who presents with vaginal discharge & abdominal cramping.  Reports intermittent cramping all day today. Can't tell how frequent it is. Rates pain 4/10 when it occurs. Hasn't treated symptoms. Denies n/v/d, constipation, dysuria, vaginal bleeding, or LOF. Reports an increase in vaginal discharge that looks brown. No foul odor, itching , or irritation.  Had some decreased movement earlier today but reports normal fetal movement not.   OB History     Gravida  4   Para  2   Term  1   Preterm  1   AB  1   Living  2      SAB  1   IAB  0   Ectopic  0   Multiple  0   Live Births  2           Past Medical History:  Diagnosis Date   Preterm labor     Past Surgical History:  Procedure Laterality Date   APPENDECTOMY      Family History  Problem Relation Age of Onset   Diabetes Maternal Grandfather    Healthy Mother    Healthy Father     Social History   Tobacco Use   Smoking status: Former    Types: Cigarettes    Quit date: 01/08/2020    Years since quitting: 1.4   Smokeless tobacco: Never  Vaping Use   Vaping Use: Never used  Substance Use Topics   Alcohol use: Not Currently    Comment: occasionally   Drug use: Never    Allergies: No Known Allergies  Medications Prior to Admission  Medication Sig Dispense Refill Last Dose   Ferrous Fumarate-Folic Acid 324-1 MG TABS Take 1 tablet by mouth every other day. 90 tablet 1 06/17/2021   Prenatal Vit-Fe Fumarate-FA (PRENATAL VITAMINS) 28-0.8 MG TABS Take 1 tablet by mouth daily. 30 tablet 11 06/17/2021   terconazole (TERAZOL 7) 0.4 % vaginal cream Place 1 applicator vaginally at bedtime. Use for 3 days. 45 g 0  More than a month    Review of Systems  Constitutional: Negative.   Gastrointestinal:  Positive for abdominal pain.  Genitourinary:  Positive for vaginal discharge. Negative for dysuria and vaginal bleeding.  Physical Exam   Blood pressure 98/66, pulse 86, temperature (!) 97.5 F (36.4 C), temperature source Oral, resp. rate 16, last menstrual period 10/13/2020, SpO2 100 %.  Physical Exam Vitals and nursing note reviewed. Exam conducted with a chaperone present.  Constitutional:      General: She is not in acute distress.    Appearance: Normal appearance.  HENT:     Head: Normocephalic and atraumatic.  Eyes:     General: No scleral icterus. Pulmonary:     Effort: Pulmonary effort is normal. No respiratory distress.  Abdominal:     Tenderness: There is no abdominal tenderness.     Comments: Gravid uterus  Genitourinary:    Comments: Dilation: 1 Effacement (%): Thick Cervical Position: Posterior Station: Ballotable Exam by:: [name] NP  Skin:    General: Skin is warm and dry.  Neurological:     Mental Status: She is alert.  Psychiatric:  Mood and Affect: Mood normal.        Behavior: Behavior normal.   NST:  Baseline: 135 bpm, Variability: Good {> 6 bpm), Accelerations: Reactive, and Decelerations: Absent  MAU Course  Procedures Results for orders placed or performed during the hospital encounter of 06/17/21 (from the past 24 hour(s))  Urinalysis, Routine w reflex microscopic Urine, Clean Catch     Status: Abnormal   Collection Time: 06/17/21  7:10 PM  Result Value Ref Range   Color, Urine STRAW (A) YELLOW   APPearance CLEAR CLEAR   Specific Gravity, Urine 1.006 1.005 - 1.030   pH 7.0 5.0 - 8.0   Glucose, UA NEGATIVE NEGATIVE mg/dL   Hgb urine dipstick NEGATIVE NEGATIVE   Bilirubin Urine NEGATIVE NEGATIVE   Ketones, ur NEGATIVE NEGATIVE mg/dL   Protein, ur NEGATIVE NEGATIVE mg/dL   Nitrite NEGATIVE NEGATIVE   Leukocytes,Ua NEGATIVE NEGATIVE  Wet  prep, genital     Status: Abnormal   Collection Time: 06/17/21  7:57 PM   Specimen: Vaginal  Result Value Ref Range   Yeast Wet Prep HPF POC NONE SEEN NONE SEEN   Trich, Wet Prep NONE SEEN NONE SEEN   Clue Cells Wet Prep HPF POC PRESENT (A) NONE SEEN   WBC, Wet Prep HPF POC MANY (A) NONE SEEN   Sperm NONE SEEN     MDM Patient presents with vaginal discharge & abdominal cramping. Some ctx on monitor. Cervix is 1/thick/ballotable & unchanged after 1 hour of monitoring. Vaginal swabs collected. Wet prep shows clue cells. Pt would like tx for BV.   Assessment and Plan   1. Braxton Hicks contractions   2. BV (bacterial vaginosis)   3. [redacted] weeks gestation of pregnancy    -rx flagyl -reviewed labor precautions -GC/CT pending   [name] 06/17/2021, 7:42 PM

## 2021-06-17 NOTE — MAU Note (Signed)
Pt reports dark brown vaginal discharge and cramping since yesterday morning. Denies LOF. States baby is moving a little, but not too much.  Pain 5/10.

## 2021-06-18 LAB — GC/CHLAMYDIA PROBE AMP (~~LOC~~) NOT AT ARMC
Chlamydia: NEGATIVE
Comment: NEGATIVE
Comment: NORMAL
Neisseria Gonorrhea: NEGATIVE

## 2021-06-30 ENCOUNTER — Other Ambulatory Visit: Payer: Self-pay

## 2021-06-30 ENCOUNTER — Ambulatory Visit (INDEPENDENT_AMBULATORY_CARE_PROVIDER_SITE_OTHER): Payer: Self-pay | Admitting: Nurse Practitioner

## 2021-06-30 VITALS — BP 106/67 | HR 78

## 2021-06-30 DIAGNOSIS — O0990 Supervision of high risk pregnancy, unspecified, unspecified trimester: Secondary | ICD-10-CM

## 2021-06-30 DIAGNOSIS — O479 False labor, unspecified: Secondary | ICD-10-CM

## 2021-06-30 DIAGNOSIS — Z3A37 37 weeks gestation of pregnancy: Secondary | ICD-10-CM

## 2021-06-30 NOTE — Progress Notes (Signed)
    Subjective:  Monica [name] is a 25 y.o. [phone] at [redacted]w[redacted]d being seen today for ongoing prenatal care.  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  Patient reports  pressure bilaterally in pelvis .  Contractions: Irritability. Vag. Bleeding: None.  Movement: Present. Denies leaking of fluid.   The following portions of the patient's history were reviewed and updated as appropriate: allergies, current medications, past family history, past medical history, past social history, past surgical history and problem list. Problem list updated.  Objective:   Vitals:   06/30/21 1108  BP: 106/67  Pulse: 78    Fetal Status: Fetal Heart Rate (bpm): 164 Fundal Height: 36 cm Movement: Present  Presentation: Vertex  General:  Alert, oriented and cooperative. Patient is in no acute distress.  Skin: Skin is warm and dry. No rash noted.   Cardiovascular: Normal heart rate noted  Respiratory: Normal respiratory effort, no problems with respiration noted  Abdomen: Soft, gravid, appropriate for gestational age. Pain/Pressure: Present     Pelvic:  Cervical exam performed Dilation: Fingertip   Station: Ballotable  Extremities: Normal range of motion.  Edema: None  Mental Status: Normal mood and affect. Normal behavior. Normal judgment and thought content.   Urinalysis:      Assessment and Plan:  Pregnancy: [id number] at [redacted]w[redacted]d  1. Supervision of high risk pregnancy, antepartum GBS done today Had GC/Chlam done in MAU  2. Braxton Hicks contractions Having periodic contractions however  baby is floating and not engaged  Term labor symptoms and general obstetric precautions including but not limited to vaginal bleeding, contractions, leaking of fluid and fetal movement were reviewed in detail with the patient. Please refer to After Visit Summary for other counseling recommendations.  Return in about 1 week (around 07/07/2021) for on person ROB.  Nolene Bernheim, RN, MSN, NP-BC Nurse Practitioner, [facility] for [employer], [facility] 06/30/2021 11:43 AM

## 2021-06-30 NOTE — Progress Notes (Signed)
Brown discharge.  

## 2021-07-03 ENCOUNTER — Encounter: Payer: Self-pay | Admitting: Nurse Practitioner

## 2021-07-03 DIAGNOSIS — O99820 Streptococcus B carrier state complicating pregnancy: Secondary | ICD-10-CM | POA: Insufficient documentation

## 2021-07-03 LAB — CULTURE, BETA STREP (GROUP B ONLY): Strep Gp B Culture: POSITIVE — AB

## 2021-07-06 ENCOUNTER — Encounter (HOSPITAL_COMMUNITY): Payer: Self-pay | Admitting: Obstetrics & Gynecology

## 2021-07-06 ENCOUNTER — Inpatient Hospital Stay (HOSPITAL_COMMUNITY)
Admission: AD | Admit: 2021-07-06 | Discharge: 2021-07-08 | DRG: 807 | Disposition: A | Discharge status: Home / Self Care | Payer: Medicaid Other | Attending: Obstetrics & Gynecology | Admitting: Obstetrics & Gynecology

## 2021-07-06 ENCOUNTER — Other Ambulatory Visit: Payer: Self-pay

## 2021-07-06 DIAGNOSIS — O09299 Supervision of pregnancy with other poor reproductive or obstetric history, unspecified trimester: Secondary | ICD-10-CM

## 2021-07-06 DIAGNOSIS — Z87891 Personal history of nicotine dependence: Secondary | ICD-10-CM

## 2021-07-06 DIAGNOSIS — D563 Thalassemia minor: Secondary | ICD-10-CM | POA: Diagnosis present

## 2021-07-06 DIAGNOSIS — O99824 Streptococcus B carrier state complicating childbirth: Secondary | ICD-10-CM | POA: Diagnosis present

## 2021-07-06 DIAGNOSIS — Z3A38 38 weeks gestation of pregnancy: Secondary | ICD-10-CM

## 2021-07-06 DIAGNOSIS — O09899 Supervision of other high risk pregnancies, unspecified trimester: Secondary | ICD-10-CM

## 2021-07-06 DIAGNOSIS — Z20822 Contact with and (suspected) exposure to covid-19: Secondary | ICD-10-CM | POA: Diagnosis present

## 2021-07-06 DIAGNOSIS — O43123 Velamentous insertion of umbilical cord, third trimester: Secondary | ICD-10-CM | POA: Diagnosis present

## 2021-07-06 DIAGNOSIS — O99820 Streptococcus B carrier state complicating pregnancy: Secondary | ICD-10-CM

## 2021-07-06 DIAGNOSIS — O368130 Decreased fetal movements, third trimester, not applicable or unspecified: Secondary | ICD-10-CM | POA: Diagnosis present

## 2021-07-06 LAB — CBC
HCT: 39.1 % (ref 36.0–46.0)
Hemoglobin: 12.8 g/dL (ref 12.0–15.0)
MCH: 28.3 pg (ref 26.0–34.0)
MCHC: 32.7 g/dL (ref 30.0–36.0)
MCV: 86.3 fL (ref 80.0–100.0)
Platelets: 263 10*3/uL (ref 150–400)
RBC: 4.53 MIL/uL (ref 3.87–5.11)
RDW: 13.4 % (ref 11.5–15.5)
WBC: 10.1 10*3/uL (ref 4.0–10.5)
nRBC: 0 % (ref 0.0–0.2)

## 2021-07-06 LAB — RESP PANEL BY RT-PCR (FLU A&B, COVID) ARPGX2
Influenza A by PCR: NEGATIVE
Influenza B by PCR: NEGATIVE
SARS Coronavirus 2 by RT PCR: NEGATIVE

## 2021-07-06 LAB — TYPE AND SCREEN
ABO/RH(D): B POS
Antibody Screen: NEGATIVE

## 2021-07-06 MED ORDER — OXYTOCIN-SODIUM CHLORIDE 30-0.9 UT/500ML-% IV SOLN
1.0000 m[IU]/min | INTRAVENOUS | Status: DC
  Administered 2021-07-06: 2 m[IU]/min via INTRAVENOUS
  Filled 2021-07-06: qty 500

## 2021-07-06 MED ORDER — OXYTOCIN-SODIUM CHLORIDE 30-0.9 UT/500ML-% IV SOLN: 2.5000 [IU]/h | INTRAVENOUS | Status: DC

## 2021-07-06 MED ORDER — FENTANYL CITRATE (PF) 100 MCG/2ML IJ SOLN: 50.0000 ug | INTRAMUSCULAR | Status: DC | PRN

## 2021-07-06 MED ORDER — TERBUTALINE SULFATE 1 MG/ML IJ SOLN: 0.2500 mg | Freq: Once | INTRAMUSCULAR | Status: DC | PRN

## 2021-07-06 MED ORDER — CALCIUM CARBONATE ANTACID 500 MG PO CHEW
2.0000 | CHEWABLE_TABLET | Freq: Four times a day (QID) | ORAL | Status: DC | PRN
  Administered 2021-07-07: 400 mg via ORAL
  Filled 2021-07-06: qty 2

## 2021-07-06 MED ORDER — LACTATED RINGERS IV BOLUS
1000.0000 mL | Freq: Once | INTRAVENOUS | Status: AC
  Administered 2021-07-06: 1000 mL via INTRAVENOUS

## 2021-07-06 MED ORDER — LIDOCAINE HCL (PF) 1 % IJ SOLN: 30.0000 mL | INTRAMUSCULAR | Status: DC | PRN

## 2021-07-06 MED ORDER — SOD CITRATE-CITRIC ACID 500-334 MG/5ML PO SOLN: 30.0000 mL | ORAL | Status: DC | PRN

## 2021-07-06 MED ORDER — AMPICILLIN SODIUM 1 G IJ SOLR
1.0000 g | INTRAMUSCULAR | Status: DC
  Administered 2021-07-07 (×2): 1 g via INTRAVENOUS
  Filled 2021-07-06 (×3): qty 1000

## 2021-07-06 MED ORDER — LACTATED RINGERS IV SOLN: 500.0000 mL | INTRAVENOUS | Status: DC | PRN

## 2021-07-06 MED ORDER — FENTANYL CITRATE (PF) 100 MCG/2ML IJ SOLN
100.0000 ug | Freq: Once | INTRAMUSCULAR | Status: AC
Start: 2021-07-06 — End: 2021-07-06
  Administered 2021-07-06: 100 ug via INTRAVENOUS
  Filled 2021-07-06: qty 2

## 2021-07-06 MED ORDER — SODIUM CHLORIDE 0.9 % IV SOLN
2.0000 g | Freq: Once | INTRAVENOUS | Status: AC
  Administered 2021-07-06: 2 g via INTRAVENOUS
  Filled 2021-07-06: qty 2000

## 2021-07-06 MED ORDER — OXYTOCIN BOLUS FROM INFUSION: 333.0000 mL | Freq: Once | INTRAVENOUS | Status: DC

## 2021-07-06 MED ORDER — OXYCODONE-ACETAMINOPHEN 5-325 MG PO TABS: 2.0000 | ORAL_TABLET | ORAL | Status: DC | PRN

## 2021-07-06 MED ORDER — ONDANSETRON HCL 4 MG/2ML IJ SOLN: 4.0000 mg | Freq: Four times a day (QID) | INTRAMUSCULAR | Status: DC | PRN

## 2021-07-06 MED ORDER — OXYCODONE-ACETAMINOPHEN 5-325 MG PO TABS: 1.0000 | ORAL_TABLET | ORAL | Status: DC | PRN

## 2021-07-06 MED ORDER — LACTATED RINGERS IV SOLN: INTRAVENOUS | Status: DC

## 2021-07-06 MED ORDER — ACETAMINOPHEN 325 MG PO TABS: 650.0000 mg | ORAL_TABLET | ORAL | Status: DC | PRN

## 2021-07-06 NOTE — H&P (Signed)
Monica [name] is a 25 y.o. female presenting for Uterine contractions and no fetal movement.   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  RN Note: Pt reports to mau with c/o ctx q 5 min since 1400 today and DFM since last night. Reports no movement since yesterday. FHR 145 in triage  Denies LOF or vag bleeding.  OB History     Gravida  4   Para  2   Term  1   Preterm  1   AB  1   Living  2      SAB  1   IAB  0   Ectopic  0   Multiple  0   Live Births  2          Past Medical History:  Diagnosis Date   Preterm labor    Past Surgical History:  Procedure Laterality Date   APPENDECTOMY     Family History: family history includes Diabetes in her maternal grandfather; Healthy in her father and mother. Social History:  reports that she quit smoking about 17 months ago. Her smoking use included cigarettes. She has never used smokeless tobacco. She reports that she does not currently use alcohol. She reports that she does not use drugs.     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  Respiratory:  Negative for shortness of breath.   Gastrointestinal:  Positive for abdominal pain. Negative for constipation, diarrhea, nausea and vomiting.  Genitourinary:  Positive for pelvic pain. Negative for vaginal bleeding.  Neurological:  Negative for weakness.  Maternal Medical History:  Reason for admission: Contractions.  Nausea.  Contractions: Onset was 1-2 hours ago.   Frequency: regular.   Perceived severity is moderate.   Fetal activity: Perceived fetal activity is normal.   Prenatal complications: No bleeding, PIH, infection, pre-eclampsia or preterm labor.   Prenatal Complications - Diabetes: none.  Dilation: 4.5 Effacement (%): 60 Station: -1 Exam by:: San Jetty, RN Blood pressure 130/68, pulse 61, temperature 98.6 F (37 C), temperature source Oral, resp. rate 16, last menstrual period 10/13/2020, SpO2 100 %. Maternal Exam:  Uterine Assessment: Contraction strength is moderate.  Contraction frequency is irregular.  Abdomen: Patient reports no abdominal tenderness. Fetal presentation: vertex Introitus: Normal vulva. Normal vagina.  Ferning test: not done.  Nitrazine test: not done. Pelvis: adequate for delivery.   Cervix: Cervix evaluated by digital exam.     Fetal Exam Fetal Monitor Review: Mode: ultrasound.   Baseline rate: 135.  Variability: moderate (6-25 bpm).   Pattern: accelerations present and no  decelerations.   Fetal State Assessment: Category I - tracings are normal.  Physical Exam Constitutional:      General: She is not in acute distress.    Appearance: She is not ill-appearing or toxic-appearing.  HENT:     Head: Normocephalic.  Cardiovascular:     Rate and Rhythm: Normal rate.  Pulmonary:     Effort: Pulmonary effort is normal.  Abdominal:     Tenderness: There is no abdominal  tenderness. There is no guarding or rebound.  Genitourinary:    General: Normal vulva.     Comments: Dilation: 4.5 Effacement (%): 60 Cervical Position: Posterior Station: -1 Exam by:: San Jetty, RN  Musculoskeletal:        General: Normal range of motion.  Skin:    General: Skin is warm and dry.  Neurological:     General: No focal deficit present.     Mental Status: She is alert.  Psychiatric:        Mood and Affect: Mood normal.        Behavior: Behavior normal.    Prenatal labs: ABO, Rh: --/--/B POS (08/28 1920) Antibody: NEG (08/28 1920) Rubella: 1.53 (03/01 1518) RPR: Non Reactive (06/23 0853)  HBsAg: Negative (03/01 1518)  HIV: Non Reactive (06/23 0853)  GBS: Positive/-- (08/22 1145)   Assessment/Plan: S[place]gle IUP at [redacted]w[redacted]d Uterine contractions Decreased fetal movement GBS Positive  Admit to Labor and Delivery Routine orders Ampicillin prophylaxis Anticipate SVD   Wynelle Bourgeois 07/06/2021, 9:48 PM

## 2021-07-06 NOTE — MAU Note (Signed)
Pt reports to mau with c/o ctx q 5 min since 1400 today and DFM since last night. Reports no movement since yesterday. FHR 145 in triage  Denies LOF or vag bleeding.

## 2021-07-06 NOTE — MAU Provider Note (Signed)
Event Date/Time   First Provider Initiated Contact with Patient 07/06/21 1803     S: Ms. Monica [name] is a 25 y.o. [id number] at [redacted]w[redacted]d  who presents to MAU today complaining contractions q 2-3 minutes since 1400. She denies vaginal bleeding. She denies LOF.   She reports decreased fetal movement this morning but is now feeling normal movement.   O: BP 111/69 (BP Location: Right Arm)   Pulse 91   Temp 98.4 F (36.9 C) (Oral)   Resp 16   LMP 10/13/2020   SpO2 100%  GENERAL: Well-developed, well-nourished female in no acute distress.  HEAD: Normocephalic, atraumatic.  CHEST: Normal effort of breathing, regular heart rate ABDOMEN: Soft, nontender, gravid  Cervical exam:  Dilation: 3.5 Effacement (%): 50 Cervical Position: Middle Exam by:: E.Edwards, RN   Fetal Monitoring: Baseline: 120 Variability: moderate Accelerations: 15x15 Decelerations: none Contractions: 1-2  Patient reports return of normal fetal movement since arrival to MAU, used clicker multiple times.   A: SIUP at [redacted]w[redacted]d   P: RN to treat as labor evaluation.  Rolm Bookbinder, [place] 07/06/2021 6:07 PM

## 2021-07-07 ENCOUNTER — Encounter (HOSPITAL_COMMUNITY): Payer: Self-pay | Admitting: Obstetrics & Gynecology

## 2021-07-07 DIAGNOSIS — O99824 Streptococcus B carrier state complicating childbirth: Secondary | ICD-10-CM

## 2021-07-07 DIAGNOSIS — O9962 Diseases of the digestive system complicating childbirth: Secondary | ICD-10-CM

## 2021-07-07 DIAGNOSIS — Z3A38 38 weeks gestation of pregnancy: Secondary | ICD-10-CM

## 2021-07-07 LAB — RPR: RPR Ser Ql: NONREACTIVE

## 2021-07-07 MED ORDER — SENNOSIDES-DOCUSATE SODIUM 8.6-50 MG PO TABS: 2.0000 | ORAL_TABLET | Freq: Every day | ORAL | Status: DC

## 2021-07-07 MED ORDER — DIBUCAINE (PERIANAL) 1 % EX OINT: 1.0000 "application " | TOPICAL_OINTMENT | CUTANEOUS | Status: DC | PRN

## 2021-07-07 MED ORDER — ONDANSETRON HCL 4 MG PO TABS: 4.0000 mg | ORAL_TABLET | ORAL | Status: DC | PRN

## 2021-07-07 MED ORDER — DIPHENHYDRAMINE HCL 25 MG PO CAPS: 25.0000 mg | ORAL_CAPSULE | Freq: Four times a day (QID) | ORAL | Status: DC | PRN

## 2021-07-07 MED ORDER — ACETAMINOPHEN 325 MG PO TABS
650.0000 mg | ORAL_TABLET | ORAL | Status: DC | PRN
Start: 2021-07-07 — End: 2021-07-08
  Administered 2021-07-08: 650 mg via ORAL
  Filled 2021-07-07 (×2): qty 2

## 2021-07-07 MED ORDER — TETANUS-DIPHTH-ACELL PERTUSSIS 5-2.5-18.5 LF-MCG/0.5 IM SUSY: 0.5000 mL | PREFILLED_SYRINGE | Freq: Once | INTRAMUSCULAR | Status: DC

## 2021-07-07 MED ORDER — ZOLPIDEM TARTRATE 5 MG PO TABS
5.0000 mg | ORAL_TABLET | Freq: Every evening | ORAL | Status: DC | PRN
Start: 2021-07-07 — End: 2021-07-08

## 2021-07-07 MED ORDER — WITCH HAZEL-GLYCERIN EX PADS
1.0000 | MEDICATED_PAD | CUTANEOUS | Status: DC | PRN
Start: 2021-07-07 — End: 2021-07-08

## 2021-07-07 MED ORDER — IBUPROFEN 600 MG PO TABS
600.0000 mg | ORAL_TABLET | Freq: Four times a day (QID) | ORAL | Status: DC
  Administered 2021-07-07 – 2021-07-08 (×5): 600 mg via ORAL
  Filled 2021-07-07 (×5): qty 1

## 2021-07-07 MED ORDER — SIMETHICONE 80 MG PO CHEW: 80.0000 mg | CHEWABLE_TABLET | ORAL | Status: DC | PRN

## 2021-07-07 MED ORDER — ONDANSETRON HCL 4 MG/2ML IJ SOLN: 4.0000 mg | INTRAMUSCULAR | Status: DC | PRN

## 2021-07-07 MED ORDER — BENZOCAINE-MENTHOL 20-0.5 % EX AERO
1.0000 "application " | INHALATION_SPRAY | CUTANEOUS | Status: DC | PRN
  Administered 2021-07-07: 1 via TOPICAL
  Filled 2021-07-07: qty 56

## 2021-07-07 MED ORDER — COCONUT OIL OIL
1.0000 "application " | TOPICAL_OIL | Status: DC | PRN
  Administered 2021-07-07: 1 via TOPICAL

## 2021-07-07 MED ORDER — PRENATAL MULTIVITAMIN CH
1.0000 | ORAL_TABLET | Freq: Every day | ORAL | Status: DC
  Administered 2021-07-07 – 2021-07-08 (×2): 1 via ORAL
  Filled 2021-07-07 (×2): qty 1

## 2021-07-07 NOTE — Progress Notes (Signed)
Patient ID: Monica [name], female   DOB: 1995-12-28, 25 y.o.   MRN: [id number] Requesting AROM  Pacing and rocking in room, no analgesia  Vitals:   07/06/21 1730 07/06/21 1731 07/06/21 2105  BP:  111/69 130/68  Pulse:  91 61  Resp:  16 16  Temp:  98.4 F (36.9 C) 98.6 F (37 C)  TempSrc:  Oral Oral  SpO2: 99% 100%    FHR stable  Dilation: 6 Effacement (%): 100 Cervical Position: Middle Station: -1, 0 Presentation: Vertex Exam by:: Edgel Degnan,cnm  AROM clear   Anticipate SVD

## 2021-07-07 NOTE — Lactation Note (Signed)
Lactation Consultation Note  Patient Name: Leesa Leifheit Today's Date: 07/07/2021   Age:25 y.o.  See infant chart for LC note.   Maternal Data    Feeding    LATCH Score                    Lactation Tools Discussed/Used    Interventions    Discharge    Consult Status      Zeynab Klett  Nicholson-Springer 07/07/2021, 2:31 PM

## 2021-07-07 NOTE — Lactation Note (Signed)
This note was copied from a baby's chart. Lactation Consultation Note  Patient Name: Monica [name] Today's Date: 07/07/2021 Reason for consult: L&D Initial assessment Age:25 hours  P3, Baby cueing.  Baby latched with ease on R breast..  Swallows observed and heard.  Hand expressed good flow from L breast to reassure mother of supply. Lactation to follow up on MBU.  Maternal Data Has patient been taught Hand Expression?: Yes Does the patient have breastfeeding experience prior to this delivery?: Yes     LATCH Score Latch: Grasps breast easily, tongue down, lips flanged, rhythmical sucking.  Audible Swallowing: A few with stimulation  Type of Nipple: Everted at rest and after stimulation  Comfort (Breast/Nipple): Soft / non-tender  Hold (Positioning): Assistance needed to correctly position infant at breast and maintain latch.  LATCH Score: 8   Interventions Interventions: Assisted with latch;Skin to skin;Hand express;Education  Consult Status Consult Status: Follow-up from L&D    Dahlia Byes [facility] 07/07/2021, 9:43 AM

## 2021-07-07 NOTE — Lactation Note (Signed)
This note was copied from a baby's chart. Lactation Consultation Note  Patient Name: Monica [name] Today's Date: 07/07/2021 Reason for consult: Initial assessment;Mother's request;Early term 37-38.6wks Age:25 years  LC assisted Mom waking infant up for a feeding. We worked on hand expression, spoon feeding and latching at the breast with breast compression observing for signs of milk transfer. Mom denied any pain with the latch.   Plan 1. To feed based on cues 8-12x in 24 hr period. Mom to offer breast first with cheeks and nose touching the breast.  2. If unable to get infant to latch, Mom to offer EBM via spoon .  3. I and O sheet reviewed.  4. LC brochure of inpatient and outpatient services reviewed.  All questions answered at the end of the visit.   Maternal Data Has patient been taught Hand Expression?: Yes Does the patient have breastfeeding experience prior to this delivery?: Yes How long did the patient breastfeed?: first child for  6 months, second child gastroschisis in NICU ( pumped and bottle fed for 1 year after repair)  Feeding Mother's Current Feeding Choice: Breast Milk  LATCH Score Latch: Repeated attempts needed to sustain latch, nipple held in mouth throughout feeding, stimulation needed to elicit sucking reflex.  Audible Swallowing: Spontaneous and intermittent  Type of Nipple: Everted at rest and after stimulation  Comfort (Breast/Nipple): Filling, red/small blisters or bruises, mild/mod discomfort (Mom some nipple trauma on left breast previous latch. Mom to use eBM for wound healing)  Hold (Positioning): Assistance needed to correctly position infant at breast and maintain latch.  LATCH Score: 7   Lactation Tools Discussed/Used    Interventions Interventions: Breast feeding basics reviewed;Support pillows;Education;Assisted with latch;Position options;Skin to skin;Expressed milk;Breast massage;Hand express;Adjust position;Breast  compression  Discharge Pump: Personal WIC Program: Yes  Consult Status Consult Status: Follow-up Date: 07/08/21 Follow-up type: In-patient    Jaaliyah Lucatero  Nicholson-Springer 07/07/2021, 3:05 PM

## 2021-07-07 NOTE — Discharge Summary (Signed)
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 yo [id number] at [redacted]w[redacted]d was admitted in Active Labor on 07/06/2021. Patient had an uncomplicated labor course as follow, receiving Amp x 2 doses prior to delivery for GBS ppx.  Membrane Rupture Time/Date: 7:30 AM ,07/07/2021   Delivery Method:Vaginal, Spontaneous  Episiotomy: None  Lacerations:  None  Patient had an uncomplicated postpartum course.  She is ambulating, tolerating a regular diet, passing flatus, and urinating well. Patient is discharged home in stable condition on 07/08/21 per her request for early d/c as long as the baby can go as well.  Newborn Data: Birth date:07/07/2021  Birth time:8:41 AM  Gender:Female  Living status:Living  Apgars:9 ,9  Weight:2880 g (6lb 5.6oz)  Magnesium Sulfate received: No BMZ received: No Rhophylac:N/A MMR:No T-DaP:Given prenatally Flu: No Transfusion:No  Physical exam  Vitals:   07/07/21 1105 07/07/21 1548 07/07/21 2015 07/08/21 0515  BP: 116/68 106/67 106/72 108/69  Pulse: 68 73 72 64   Resp: $Remo'18 18 18 17  'AAJyk$ Temp: 99.1 F (37.3 C) 98 F (36.7 C) 97.9 F (36.6 C) 97.9 F (36.6 C)  TempSrc: Oral Axillary Oral Oral  SpO2:   100% 100%   General: alert and cooperative Lochia: appropriate Uterine Fundus: firm Incision: N/A DVT Evaluation: No evidence of DVT seen on physical exam. Labs: Lab Results  Component Value Date   WBC 10.0 07/08/2021   HGB 10.1 (L) 07/08/2021   HCT 30.4 (L) 07/08/2021   MCV 84.7 07/08/2021   PLT 205 07/08/2021   CMP Latest Ref Rng & Units 03/22/2019  Glucose 70 - 99 mg/dL 100(H)  BUN 6 - 20 mg/dL 18  Creatinine 0.44 - 1.00 mg/dL 0.89  Sodium 135 - 145 mmol/L 137  Potassium 3.5 - 5.1 mmol/L 3.6  Chloride 98 - 111 mmol/L 104  CO2 22 - 32 mmol/L 26  Calcium 8.9 - 10.3 mg/dL 9.3  Total Protein 6.5 - 8.1 g/dL 8.2(H)  Total Bilirubin 0.3 - 1.2 mg/dL 0.4  Alkaline Phos 38 - 126 U/L 85  AST 15 - 41 U/L 24  ALT 0 - 44 U/L 29   Edinburgh Score: Edinburgh Postnatal Depression Scale Screening Tool 07/08/2021  I have been able to laugh and see the funny side of things. 0  I have looked forward with enjoyment to things. 0  I have blamed myself unnecessarily when things went wrong. 0  I have been anxious or worried  for no good reason. 2  I have felt scared or panicky for no good reason. 0  Things have been getting on top of me. 0  I have been so unhappy that I have had difficulty sleeping. 0  I have felt sad or miserable. 1  I have been so unhappy that I have been crying. 1  The thought of harming myself has occurred to me. 0  Edinburgh Postnatal Depression Scale Total 4     After visit meds:  Allergies as of 07/08/2021   No Known Allergies      Medication List     STOP taking these medications    Ferrous Fumarate-Folic Acid 277-3 MG Tabs       TAKE these medications    ibuprofen 600 MG tablet Commonly known as: ADVIL Take 1 tablet (600 mg total) by mouth every 6 (six) hours as needed.   Prenatal Vitamins 28-0.8 MG  Tabs Take 1 tablet by mouth daily.         Discharge home in stable condition Infant Feeding: Breast Infant Disposition:home with mother Discharge instruction: per After Visit Summary and Postpartum booklet. Activity: Advance as tolerated. Pelvic rest for 6 weeks.  Diet: routine diet Future Appointments: Future Appointments  Date Time Provider [employer]  08/11/2021  1:15 PM Chancy Milroy, MD [facility]   Follow up Visit:   Please schedule this patient for a In person postpartum visit in 4 weeks with the following provider: Any provider. Additional Postpartum F/U: n/a   High risk pregnancy complicated by:  hx gastroschesis Delivery mode:  Vaginal, Spontaneous SVD Anticipated Birth Control:  IUD   07/08/2021 Myrtis Ser, CNM 11:03 AM

## 2021-07-07 NOTE — Progress Notes (Signed)
Patient ID: Monica [name], female   DOB: 06-29-1996, 25 y.o.   MRN: [id number] Called for delivery and feeling a lot of pressure  Vitals:   07/06/21 1730 07/06/21 1731 07/06/21 2105 07/07/21 0736  BP:  111/69 130/68 (!) 94/43  Pulse:  91 61 (!) 111  Resp:  16 16 16   Temp:  98.4 F (36.9 C) 98.6 F (37 C) 98 F (36.7 C)  TempSrc:  Oral Oral Oral  SpO2: 99% 100%     FHR reassuring UCs q74min  Dilation: Lip/rim Effacement (%): 100 Cervical Position: Middle Station: Plus 1 Presentation: Vertex Exam by:: lee  Large anterior lip (2cm) from 9:00 to 3:00  Encouraged to breathe through contractions for now

## 2021-07-07 NOTE — Progress Notes (Signed)
Patient ID: Monica [name], female   DOB: Jun 11, 1996, 25 y.o.   MRN: [id number] Patient requested augmentation Vitals:   07/06/21 1730 07/06/21 1731 07/06/21 2105  BP:  111/69 130/68  Pulse:  91 61  Resp:  16 16  Temp:  98.4 F (36.9 C) 98.6 F (37 C)  TempSrc:  Oral Oral  SpO2: 99% 100%    FHR reactive UCs q 2-19min  Dilation: 4.5 Effacement (%): 60 Cervical Position: Posterior Station: -1 Presentation: Vertex Exam by:: Lamont Snowball, RN  Unchanged from admission  Will start Pitocin low dose

## 2021-07-08 ENCOUNTER — Encounter: Payer: Self-pay | Admitting: Family Medicine

## 2021-07-08 LAB — CBC
HCT: 30.4 % — ABNORMAL LOW (ref 36.0–46.0)
Hemoglobin: 10.1 g/dL — ABNORMAL LOW (ref 12.0–15.0)
MCH: 28.1 pg (ref 26.0–34.0)
MCHC: 33.2 g/dL (ref 30.0–36.0)
MCV: 84.7 fL (ref 80.0–100.0)
Platelets: 205 10*3/uL (ref 150–400)
RBC: 3.59 MIL/uL — ABNORMAL LOW (ref 3.87–5.11)
RDW: 13.4 % (ref 11.5–15.5)
WBC: 10 10*3/uL (ref 4.0–10.5)
nRBC: 0 % (ref 0.0–0.2)

## 2021-07-08 MED ORDER — IBUPROFEN 600 MG PO TABS: 600.0000 mg | ORAL_TABLET | Freq: Four times a day (QID) | ORAL | 0 refills | Status: DC | PRN

## 2021-07-08 NOTE — Lactation Note (Signed)
This note was copied from a baby's chart. Lactation Consultation Note  Patient Name: Monica [name] Date: 07/08/2021 Reason for consult: Follow-up assessment;Early term 37-38.6wks Age:25 hours  LC in to visit with P3 Mom of ET on day of discharge.  Baby at 3.3% weight loss. Mom has been latching baby to the breast and supplementing with formula (Mom's choice).  Mom reports baby latches without difficulty.    Encouraged STS and feeding baby at the breast with feeding cues. Goal of >8 feedings per 24 hrs shared.    Mom has a DEBP at home.  Will take the pump parts home with her if she was ever to use a [employer] DEBP from [facility].   Mom aware of OP lactation support available and encouraged her to call prn.   Lactation Tools Discussed/Used Tools: Pump;Bottle Breast pump type: Double-Electric Breast Pump  Interventions Interventions: Breast feeding basics reviewed;Skin to skin;Breast massage;Hand express;DEBP;Hand pump;Education;Pace feeding  Discharge Discharge Education: Engorgement and breast care  Consult Status Consult Status: Complete Date: 07/08/21    Monica [name] 07/08/2021, 12:00 PM

## 2021-07-19 ENCOUNTER — Telehealth (HOSPITAL_COMMUNITY): Payer: Self-pay

## 2021-07-19 NOTE — Telephone Encounter (Signed)
  No answer. Left message to return nurse call.  Marcelino Duster [facility] 07/19/2021,1720

## 2021-07-20 ENCOUNTER — Inpatient Hospital Stay (HOSPITAL_COMMUNITY): Admit: 2021-07-20 | Payer: Self-pay

## 2021-08-11 ENCOUNTER — Other Ambulatory Visit: Payer: Self-pay

## 2021-08-11 ENCOUNTER — Ambulatory Visit (INDEPENDENT_AMBULATORY_CARE_PROVIDER_SITE_OTHER): Payer: Self-pay | Admitting: Obstetrics and Gynecology

## 2021-08-11 ENCOUNTER — Encounter: Payer: Self-pay | Admitting: Obstetrics and Gynecology

## 2021-08-11 ENCOUNTER — Other Ambulatory Visit (HOSPITAL_COMMUNITY)
Admission: RE | Admit: 2021-08-11 | Discharge: 2021-08-11 | Disposition: A | Discharge status: Home / Self Care | Payer: Self-pay | Source: Ambulatory Visit | Attending: Obstetrics and Gynecology | Admitting: Obstetrics and Gynecology

## 2021-08-11 VITALS — BP 127/91 | HR 83 | Wt 134.0 lb

## 2021-08-11 DIAGNOSIS — Z3202 Encounter for pregnancy test, result negative: Secondary | ICD-10-CM

## 2021-08-11 DIAGNOSIS — Z309 Encounter for contraceptive management, unspecified: Secondary | ICD-10-CM | POA: Insufficient documentation

## 2021-08-11 DIAGNOSIS — N898 Other specified noninflammatory disorders of vagina: Secondary | ICD-10-CM

## 2021-08-11 DIAGNOSIS — R87612 Low grade squamous intraepithelial lesion on cytologic smear of cervix (LGSIL): Secondary | ICD-10-CM

## 2021-08-11 DIAGNOSIS — Z30013 Encounter for initial prescription of injectable contraceptive: Secondary | ICD-10-CM

## 2021-08-11 LAB — POCT PREGNANCY, URINE: Preg Test, Ur: NEGATIVE

## 2021-08-11 MED ORDER — MEDROXYPROGESTERONE ACETATE 150 MG/ML IM SUSP
150.0000 mg | Freq: Once | INTRAMUSCULAR | Status: AC
  Administered 2021-08-11: 150 mg via INTRAMUSCULAR

## 2021-08-11 NOTE — Progress Notes (Signed)
    Post Partum Visit Note  Monica [name] is a 25 y.o. [phone] female who presents for a postpartum visit. She is 5 weeks postpartum following a normal spontaneous vaginal delivery.  I have fully reviewed the prenatal and intrapartum course. The delivery was at 38 gestational weeks.  Anesthesia: none. Postpartum course has been unremarkable. Baby is doing well. Baby is feeding by both breast and bottle - similac . Bleeding staining only. Bowel function is normal. Bladder function is normal. Patient is not sexually active. Contraception method is none. Postpartum depression screening: negative.   The pregnancy intention screening data noted above was reviewed. Potential methods of contraception were discussed. The patient elected to proceed with No data recorded.    Health Maintenance Due  Topic Date Due   COVID-19 Vaccine (1) Never done   HPV VACCINES (1 - 2-dose series) Never done   INFLUENZA VACCINE  06/09/2021    Medical recordes  Review of Systems Pertinent items noted in HPI and remainder of comprehensive ROS otherwise negative.  Objective:  There were no vitals taken for this visit.   General:  alert   Breasts:  not indicated  Lungs: clear to auscultation bilaterally  Heart:  regular rate and rhythm, S1, S2 normal, no murmur, click, rub or gallop  Abdomen: soft, non-tender; bowel sounds normal; no masses,  no organomegaly   Wound NA  GU exam:  not indicated       Assessment:    There are no diagnoses linked to this encounter.  Nl postpartum exam.  Vaginal discharge Abnormal pap smear Contraception management  Plan:   Essential components of care per ACOG recommendations:  1.  Mood and well being: Patient with negative depression screening today. Reviewed local resources for support.  - Patient tobacco use? No.   - hx of drug use? No.    2. Infant care and feeding:  -Patient currently breastmilk feeding? Yes. Discussed returning to work and  pumping. Reviewed importance of draining breast regularly to support lactation.  -Social determinants of health (SDOH) reviewed in EPIC. No concerns  3. Sexuality, contraception and birth spacing - Patient does not want a pregnancy in the next year.  Desired family size is uncertain.  - Reviewed forms of contraception in tiered fashion. Patient desired Depo-Provera today.   - Discussed birth spacing of 18 months  4. Sleep and fatigue -Encouraged family/partner/community support of 4 hrs of uninterrupted sleep to help with mood and fatigue  5. Physical Recovery  - Discussed patients delivery and complications. She describes her labor as good. - Patient had a Vaginal, no problems at delivery.  Perineal healing reviewed. Patient expressed understanding - Patient has urinary incontinence? No. - Patient is safe to resume physical and sexual activity  6.  Health Maintenance - HM due items addressed Yes - Last pap smear  Diagnosis  Date Value Ref Range Status  01/07/2021 - Low grade squamous intraepithelial lesion (LSIL) (A)  Final   Pap smear not done at today's visit.  -Breast Cancer screening indicated? No.   7. Chronic Disease/Pregnancy Condition follow up: None  - PCP follow up   Self swab today for vaginal discharge Depo Provera today, desires IUD, but does not have Medicaid card for verification of coverage. Will schedule insertion and have card at that appt. Recommended repeat pap smear 01/2021  Nettie Elm, MD [facility], [facility]

## 2021-08-11 NOTE — Addendum Note (Signed)
Addended byVidal Schwalbe on: 08/11/2021 02:25 PM   Modules accepted: Orders

## 2021-08-11 NOTE — Patient Instructions (Signed)
Mantenimiento de la salud en las mujeres Health Maintenance, Female Adoptar un estilo de vida saludable y recibir atencin preventiva son importantes para promover la salud y el bienestar. Consulte al mdico sobre: El esquema adecuado para hacerse pruebas y exmenes peridicos. Cosas que puede hacer por su cuenta para prevenir enfermedades y mantenerse sana. Qu debo saber sobre la dieta, el peso y el ejercicio? Consuma una dieta saludable  Consuma una dieta que incluya muchas verduras, frutas, productos lcteos con bajo contenido de grasa y protenas magras. No consuma muchos alimentos ricos en grasas slidas, azcares agregados o sodio.  Mantenga un peso saludable El ndice de masa muscular (IMC) se utiliza para identificar problemas de peso. Proporciona una estimacin de la grasa corporal basndose en el peso y la altura. Su mdico puede ayudarle a determinar su IMC y a lograr o mantener unpeso saludable. Haga ejercicio con regularidad Haga ejercicio con regularidad. Esta es una de las prcticas ms importantes que puede hacer por su salud. La mayora de los adultos deben seguir estas pautas: Realizar, al menos, 150minutos de actividad fsica por semana. El ejercicio debe aumentar la frecuencia cardaca y hacerlo transpirar (ejercicio de intensidad moderada). Hacer ejercicios de fortalecimiento por lo menos dos veces por semana. Agregue esto a su plan de ejercicio de intensidad moderada. Pasar menos tiempo sentados. Incluso la actividad fsica ligera puede ser beneficiosa. Controle sus niveles de colesterol y lpidos en la sangre Comience a realizarse anlisis de lpidos y colesterol en la sangre a los20aos y luego reptalos cada 5aos. Hgase controlar los niveles de colesterol con mayor frecuencia si: Sus niveles de lpidos y colesterol son altos. Es mayor de 40aos. Presenta un alto riesgo de padecer enfermedades cardacas. Qu debo saber sobre las pruebas de deteccin del  cncer? Segn su historia clnica y sus antecedentes familiares, es posible que deba realizarse pruebas de deteccin del cncer en diferentes edades. Esto puede incluir pruebas de deteccin de lo siguiente: Cncer de mama. Cncer de cuello uterino. Cncer colorrectal. Cncer de piel. Cncer de pulmn. Qu debo saber sobre la enfermedad cardaca, la diabetes y la hipertensinarterial? Presin arterial y enfermedad cardaca La hipertensin arterial causa enfermedades cardacas y aumenta el riesgo de accidente cerebrovascular. Es ms probable que esto se manifieste en las personas que tienen lecturas de presin arterial alta, tienen ascendencia africana o tienen sobrepeso. Hgase controlar la presin arterial: Cada 3 a 5 aos si tiene entre 18 y 39 aos. Todos los aos si es mayor de 40aos. Diabetes Realcese exmenes de deteccin de la diabetes con regularidad. Este anlisis revisa el nivel de azcar en la sangre en ayunas. Hgase las pruebas de deteccin: Cada tresaos despus de los 40aos de edad si tiene un peso normal y un bajo riesgo de padecer diabetes. Con ms frecuencia y a partir de una edad inferior si tiene sobrepeso o un alto riesgo de padecer diabetes. Qu debo saber sobre la prevencin de infecciones? Hepatitis B Si tiene un riesgo ms alto de contraer hepatitis B, debe someterse a un examen de deteccin de este virus. Hable con el mdico para averiguar si tiene riesgode contraer la infeccin por hepatitis B. Hepatitis C Se recomienda el anlisis a: Todos los que nacieron entre 1945 y 1965. Todas las personas que tengan un riesgo de haber contrado hepatitis C. Enfermedades de transmisin sexual (ETS) Hgase las pruebas de deteccin de ITS, incluidas la gonorrea y la clamidia, si: Es sexualmente activa y es menor de 24aos. Es mayor de 24aos, y el mdico   le informa que corre riesgo de tener este tipo de infecciones. La actividad sexual ha cambiado desde que le  hicieron la ltima prueba de deteccin y tiene un riesgo mayor de tener clamidia o gonorrea. Pregntele al mdico si usted tiene riesgo. Pregntele al mdico si usted tiene un alto riesgo de contraer VIH. El mdico tambin puede recomendarle un medicamento recetado para ayudar a evitar la infeccin por el VIH. Si elige tomar medicamentos para prevenir el VIH, primero debe hacerse los anlisis de deteccin del VIH. Luego debe hacerse anlisis cada 3meses mientras est tomando los medicamentos. Embarazo Si est por dejar de menstruar (fase premenopusica) y usted puede quedar embarazada, busque asesoramiento antes de quedar embarazada. Tome de 400 a 800microgramos (mcg) de cido flico todos los das si queda embarazada. Pida mtodos de control de la natalidad (anticonceptivos) si desea evitar un embarazo no deseado. Osteoporosis y menopausia La osteoporosis es una enfermedad en la que los huesos pierden los minerales y la fuerza por el avance de la edad. El resultado pueden ser fracturas en los huesos. Si tiene 65aos o ms, o si est en riesgo de sufrir osteoporosis y fracturas, pregunte a su mdico si debe: Hacerse pruebas de deteccin de prdida sea. Tomar un suplemento de calcio o de vitamina D para reducir el riesgo de fracturas. Recibir terapia de reemplazo hormonal (TRH) para tratar los sntomas de la menopausia. Siga estas instrucciones en su casa: Estilo de vida No consuma ningn producto que contenga nicotina o tabaco, como cigarrillos, cigarrillos electrnicos y tabaco de mascar. Si necesita ayuda para dejar de fumar, consulte al mdico. No consuma drogas. No comparta agujas. Solicite ayuda a su mdico si necesita apoyo o informacin para abandonar las drogas. Consumo de alcohol No beba alcohol si: Su mdico le indica no hacerlo. Est embarazada, puede estar embarazada o est tratando de quedar embarazada. Si bebe alcohol: Limite la cantidad que consume de 0 a 1 medida por  da. Limite la ingesta si est amamantando. Est atento a la cantidad de alcohol que hay en las bebidas que toma. En [place], una medida equivale a una botella de cerveza de 12oz (355ml), un vaso de vino de 5oz (148ml) o un vaso de una bebida alcohlica de alta graduacin de 1oz (44ml). Instrucciones generales Realcese los estudios de rutina de la salud, dentales y de la vista. Mantngase al da con las vacunas. Infrmele a su mdico si: Se siente deprimida con frecuencia. Alguna vez ha sido vctima de maltrato o no se siente segura en su casa. Resumen Adoptar un estilo de vida saludable y recibir atencin preventiva son importantes para promover la salud y el bienestar. Siga las instrucciones del mdico acerca de una dieta saludable, el ejercicio y la realizacin de pruebas o exmenes para detectar enfermedades. Siga las instrucciones del mdico con respecto al control del colesterol y la presin arterial. Esta informacin no tiene como fin reemplazar el consejo del mdico. Asegresede hacerle al mdico cualquier pregunta que tenga. Document Revised: 11/16/2018 Document Reviewed: 11/16/2018 Elsevier Patient Education  2022 Elsevier Inc.  

## 2021-08-12 LAB — CERVICOVAGINAL ANCILLARY ONLY
Bacterial Vaginitis (gardnerella): POSITIVE — AB
Candida Glabrata: NEGATIVE
Candida Vaginitis: NEGATIVE
Chlamydia: NEGATIVE
Comment: NEGATIVE
Comment: NEGATIVE
Comment: NEGATIVE
Comment: NEGATIVE
Comment: NEGATIVE
Comment: NORMAL
Neisseria Gonorrhea: NEGATIVE
Trichomonas: NEGATIVE

## 2021-08-13 ENCOUNTER — Telehealth: Payer: Self-pay

## 2021-08-13 MED ORDER — METRONIDAZOLE 500 MG PO TABS: 500.0000 mg | ORAL_TABLET | Freq: Two times a day (BID) | ORAL | 0 refills | Status: DC

## 2021-08-13 NOTE — Telephone Encounter (Signed)
-----   Message from Hermina Staggers, MD sent at 08/13/2021 11:12 AM EDT ----- Please send in Rx for BV as per protocol. Pt is aware. Thanks Casimiro Needle

## 2021-08-13 NOTE — Telephone Encounter (Signed)
Rx Flagyl sent to pharmacy on file per protocol per Dr Alysia Penna.  Judeth Cornfield, RN

## 2021-09-26 ENCOUNTER — Other Ambulatory Visit: Payer: Self-pay

## 2021-11-01 IMAGING — US US OB COMP LESS 14 WK
1 series · 15 of 28 positions shown · non-contrast
Comparison: None.

CLINICAL DATA: Abdomen pain

EXAM:
OBSTETRIC <14 WK ULTRASOUND
TECHNIQUE: Transabdominal ultrasound was performed for evaluation of the
gestation as well as the maternal uterus and adnexal regions.

[Series 1: us ob comp less 14 wk · 39 acquisitions, 15 frames shown]
[im 1/39]
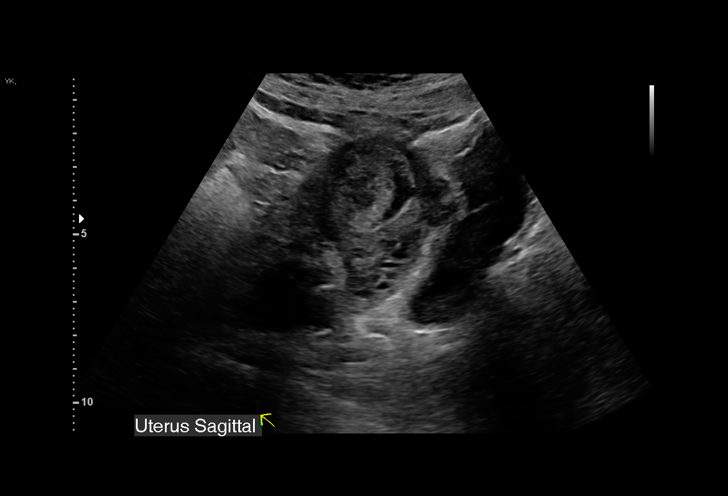
[im 3/39]
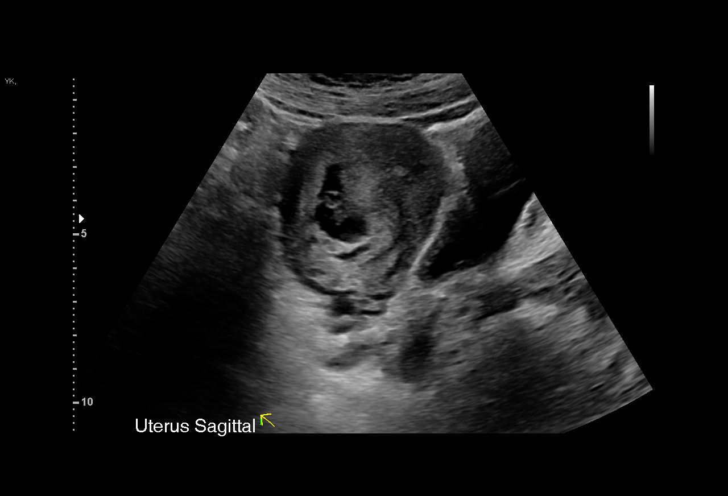
[im 6/39]
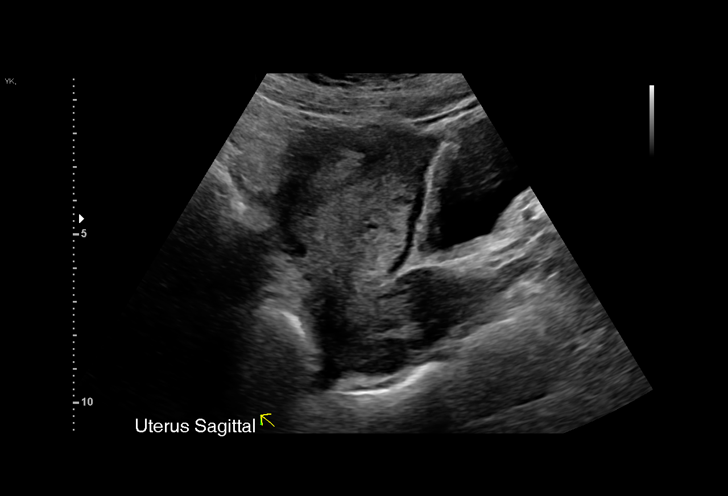
[im 9/39]
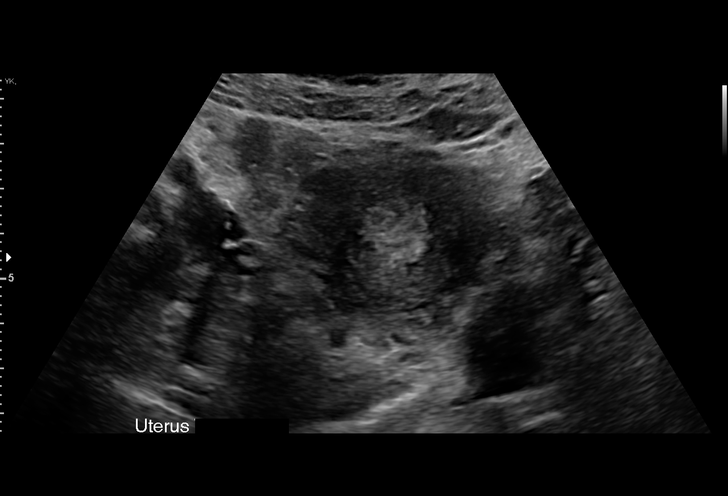
[im 12/39]
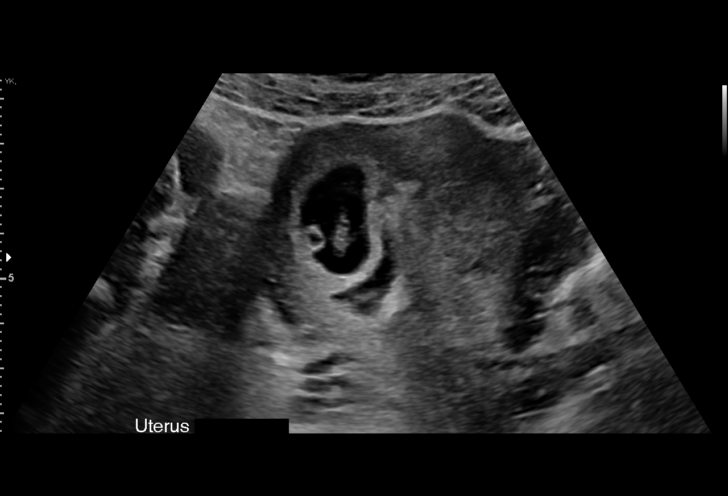
[im 15/39]
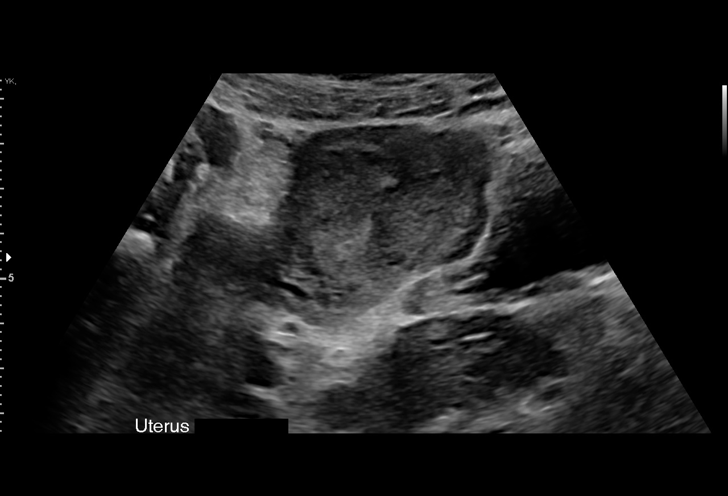
[im 17/39]
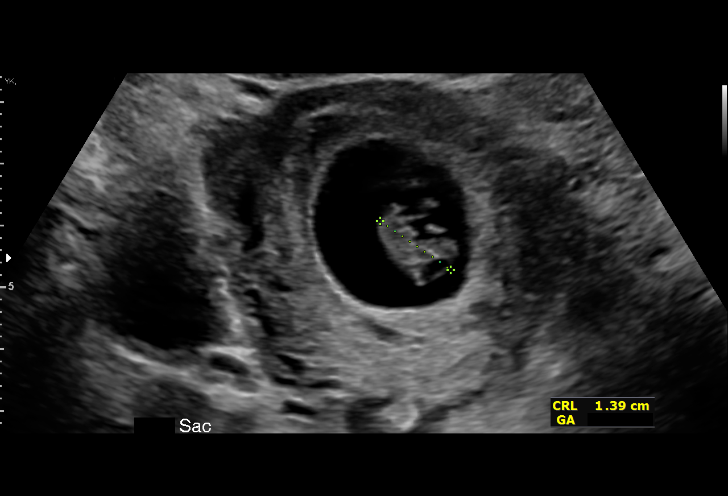
[im 20/39]
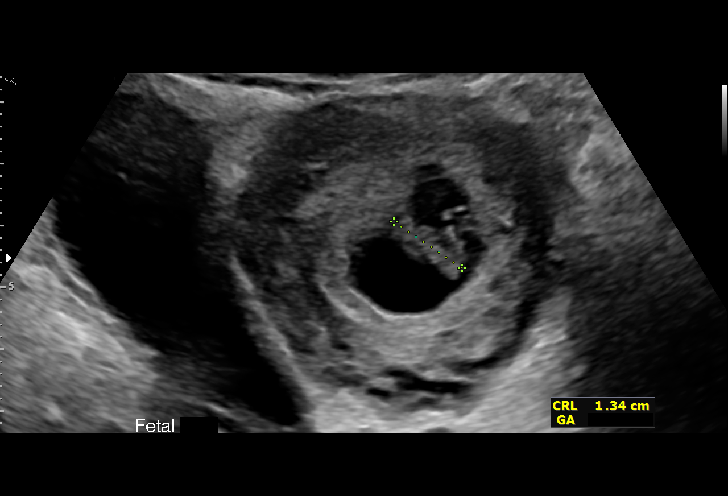
[im 22/39]
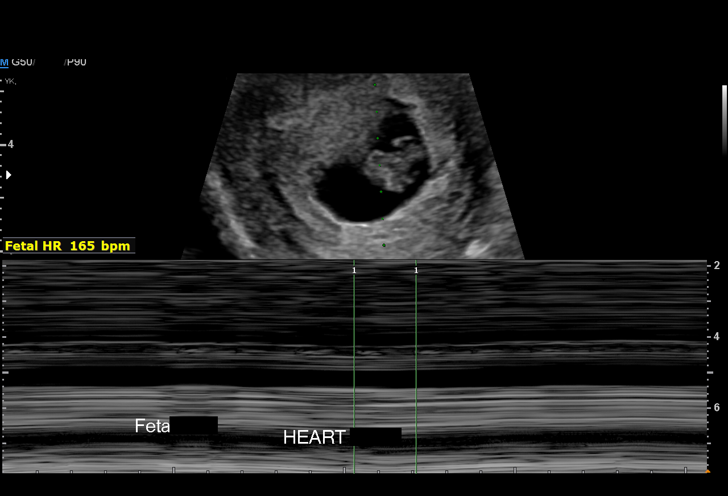
[im 24/39]
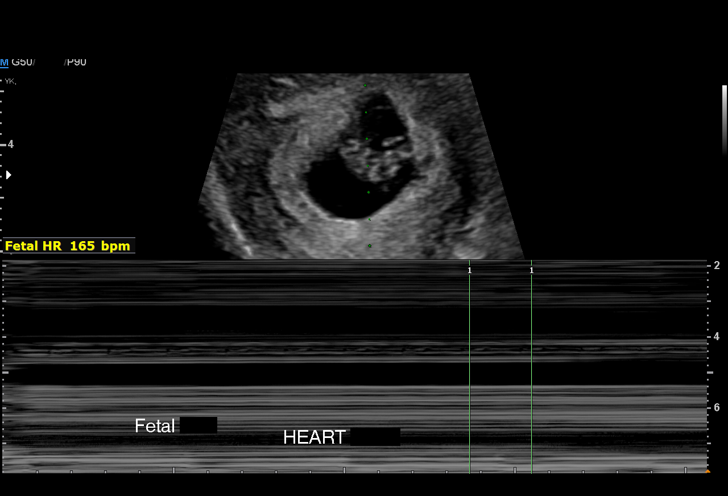
[im 27/39]
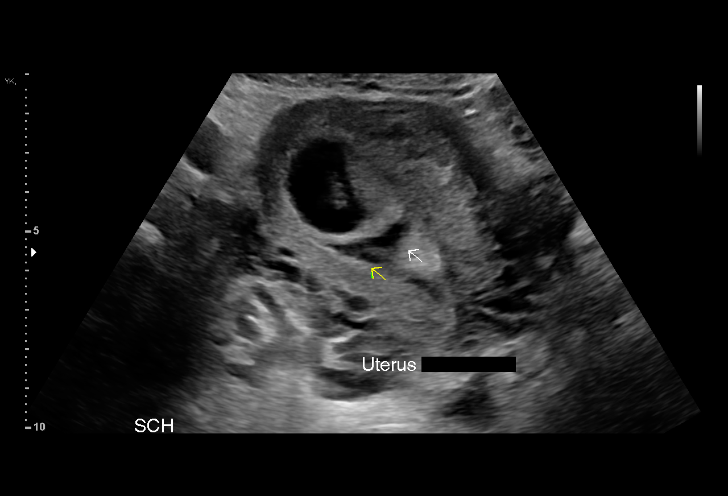
[im 30/39]
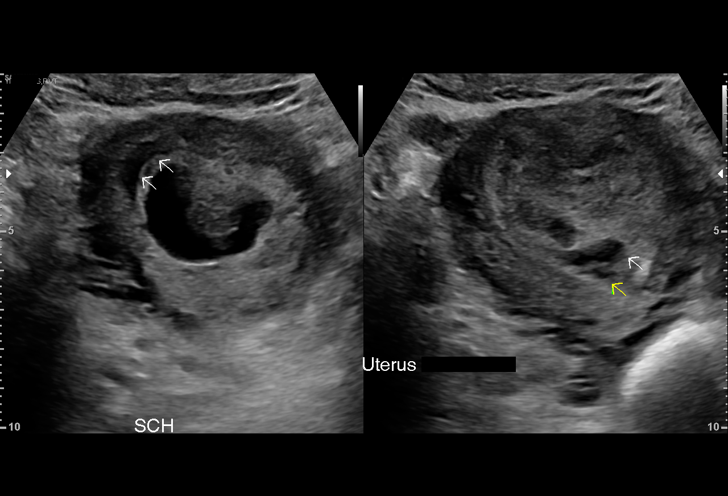
[im 33/39]
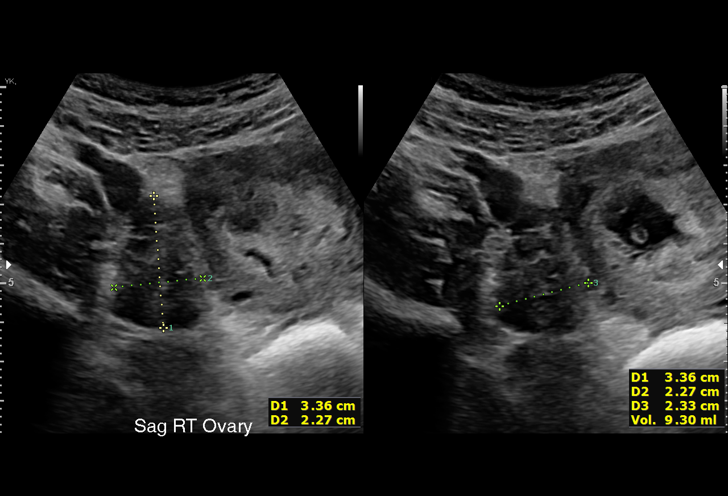
[im 36/39]
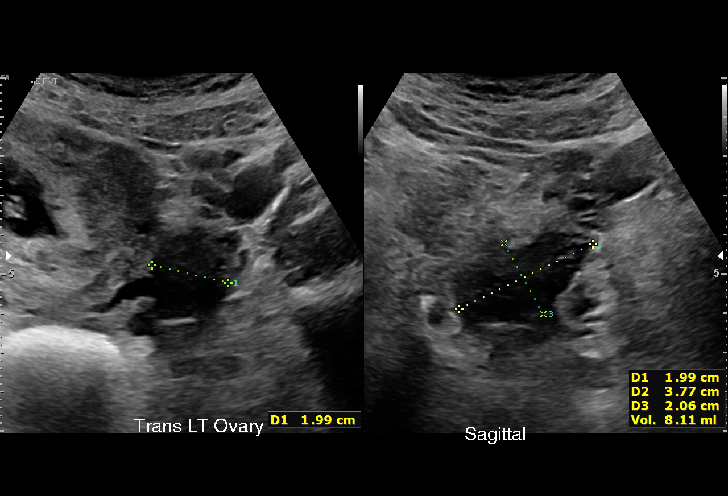
[im 39/39]
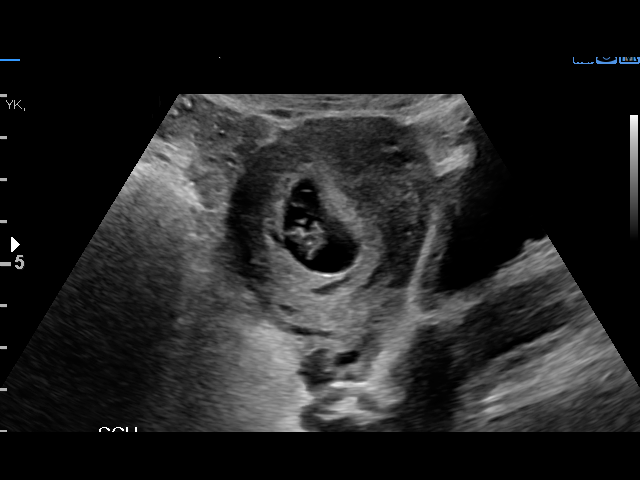

[15 of 28 positions shown; findings below may reference images not displayed]

FINDINGS: Intrauterine gestational sac: Single intrauterine gestational sac

Yolk sac:  Visualized

Embryo:  Visualized

Cardiac Activity: Visualized

Heart Rate: 169 bpm

CRL: 13.5 mm   7 w 4 d                  US EDC: 07/21/2021

Subchorionic hemorrhage:  Moderate subchorionic hemorrhage.

Maternal uterus/adnexae: Ovaries are within normal limits. Right
ovary measures 3.4 x 2.3 x 2.3 cm. Left ovary measures 3.8 x 2 x 2
cm. No significant free fluid
IMPRESSION: 1. Single viable intrauterine pregnancy as above.
2. Moderate subchorionic hemorrhage

## 2021-11-07 ENCOUNTER — Ambulatory Visit (HOSPITAL_COMMUNITY)
Admission: EM | Admit: 2021-11-07 | Discharge: 2021-11-07 | Disposition: A | Discharge status: Home / Self Care | Payer: Self-pay | Attending: Internal Medicine | Admitting: Internal Medicine

## 2021-11-07 ENCOUNTER — Other Ambulatory Visit: Payer: Self-pay

## 2021-11-07 ENCOUNTER — Encounter (HOSPITAL_COMMUNITY): Payer: Self-pay

## 2021-11-07 DIAGNOSIS — N760 Acute vaginitis: Secondary | ICD-10-CM | POA: Insufficient documentation

## 2021-11-07 LAB — POCT URINALYSIS DIPSTICK, ED / UC
Bilirubin Urine: NEGATIVE
Glucose, UA: NEGATIVE mg/dL
Ketones, ur: NEGATIVE mg/dL
Leukocytes,Ua: NEGATIVE
Nitrite: NEGATIVE
Protein, ur: NEGATIVE mg/dL
Specific Gravity, Urine: >=1.03 (ref 1.005–1.030)
Urobilinogen, UA: 0.2 mg/dL (ref 0.0–1.0)
pH: 6 (ref 5.0–8.0)

## 2021-11-07 MED ORDER — FLUCONAZOLE 150 MG PO TABS: 150.0000 mg | ORAL_TABLET | Freq: Once | ORAL | 0 refills | Status: AC

## 2021-11-07 NOTE — ED Triage Notes (Signed)
Pt presents with vaginal odor, vaginal itching & burning, and lower abdominal pain X 1 week.

## 2021-11-07 NOTE — ED Provider Notes (Signed)
[facility]    CSN: [id number] Arrival date & time: 11/07/21  1256      History   Chief Complaint Chief Complaint  Patient presents with   Vaginitis    HPI Monica [name] is a 25 y.o. female comes to the urgent care with 1 week history of itching vaginal discharge.  Vaginal discharge started a week ago.  Discharge is white in color, thick and has an abnormal odor.  It is associated with lower abdominal pain.  She has some irritation in the vaginal area.  No nausea or vomiting.  Patient complains of superficial and deep dyspareunia.  She has a single sexual partner and engages in unprotected sexual intercourse.  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 Authorizing Provider  fluconazole (DIFLUCAN) 150 MG tablet Take 1 tablet (150 mg total) by mouth once for 1 dose. If no symptom improvement in 72 hours please take the second tablet. 11/07/21 11/07/21 Yes Saba Gomm, Britta Mccreedy, MD  ibuprofen (ADVIL) 600 MG tablet Take 1 tablet (600 mg total) by mouth every 6 (six) hours as needed. 07/08/21   Arabella Merles, CNM  Prenatal Vit-Fe Fumarate-FA (PRENATAL VITAMINS) 28-0.8 MG TABS Take 1 tablet by mouth daily. 01/07/21   Alric Seton, MD    Family History Family History  Problem Relation Age of Onset   Diabetes  Maternal Grandfather    Healthy Mother    Healthy Father     Social History Social History   Tobacco Use   Smoking status: Former    Types: Cigarettes    Quit date: 01/08/2020    Years since quitting: 1.8   Smokeless tobacco: Never  Vaping Use   Vaping Use: Never used  Substance Use Topics   Alcohol use: Not Currently    Comment: occasionally   Drug use: Never     Allergies   Patient has no known allergies.   Review of Systems Review of Systems  Constitutional: Negative.  Negative for chills and fever.  Gastrointestinal:  Positive for abdominal pain.  Genitourinary:  Positive for dysuria, frequency, urgency, vaginal discharge and vaginal pain.  Musculoskeletal: Negative.     Physical Exam Triage Vital Signs ED Triage Vitals  Enc Vitals Group     BP 11/07/21 1501 110/66     Pulse Rate 11/07/21 1501 69     Resp 11/07/21 1501 18     Temp 11/07/21 1501 98.6 F (37 C)     Temp Source 11/07/21 1501 Oral     SpO2 11/07/21 1501 100 %  Weight --      Height --      Head Circumference --      Peak Flow --      Pain Score 11/07/21 1459 4     Pain Loc --      Pain Edu? --      Excl. in [place]? --    No data found.  Updated Vital Signs BP 110/66 (BP Location: Left Arm)    Pulse 69    Temp 98.6 F (37 C) (Oral)    Resp 18    LMP 10/09/2021    SpO2 100%   Visual Acuity Right Eye Distance:   Left Eye Distance:   Bilateral Distance:    Right Eye Near:   Left Eye Near:    Bilateral Near:     Physical Exam Vitals and nursing note reviewed.  Constitutional:      General: She is not in acute distress.    Appearance: She is not ill-appearing.  HENT:     Right Ear: Tympanic membrane normal.     Left Ear: Tympanic membrane normal.  Cardiovascular:     Rate and Rhythm: Normal rate and regular rhythm.  Pulmonary:     Effort: Pulmonary effort is normal.     Breath sounds: Normal breath sounds.  Abdominal:     General: Bowel sounds are normal.     Palpations:  Abdomen is soft.  Musculoskeletal:        General: Normal range of motion.  Neurological:     Mental Status: She is alert.     UC Treatments / Results  Labs (all labs ordered are listed, but only abnormal results are displayed) Labs Reviewed  POCT URINALYSIS DIPSTICK, ED / UC - Abnormal; Notable for the following components:      Result Value   Hgb urine dipstick SMALL (*)    All other components within normal limits  CERVICOVAGINAL ANCILLARY ONLY    EKG   Radiology No results found.  Procedures Procedures (including critical care time)  Medications Ordered in UC Medications - No data to display  Initial Impression / Assessment and Plan / UC Course  I have reviewed the triage vital signs and the nursing notes.  Pertinent labs & imaging results that were available during my care of the patient were reviewed by me and considered in my medical decision making (see chart for details).     1.  Acute vaginitis: Point-of-care urinalysis is positive for hemoglobin Cervicovaginal for GC/chlamydia/trichomonas Fluconazole We will call patient with recommendations if labs are abnormal Return precautions given. Final Clinical Impressions(s) / UC Diagnoses   Final diagnoses:  Acute vaginitis     Discharge Instructions      Please take medications as prescribed You do not have a urine infection We will call you with recommendations if labs are abnormal Return to urgent care if you have worsening symptoms.   ED Prescriptions     Medication Sig Dispense Auth. Provider   fluconazole (DIFLUCAN) 150 MG tablet Take 1 tablet (150 mg total) by mouth once for 1 dose. If no symptom improvement in 72 hours please take the second tablet. 2 tablet Dinita Migliaccio, Myrene Galas, MD      PDMP not reviewed this encounter.   Chase Picket, MD 11/07/21 [phone]

## 2021-11-07 NOTE — Discharge Instructions (Addendum)
Please take medications as prescribed You do not have a urine infection We will call you with recommendations if labs are abnormal Return to urgent care if you have worsening symptoms.

## 2021-11-11 LAB — CERVICOVAGINAL ANCILLARY ONLY
Chlamydia: NEGATIVE
Comment: NEGATIVE
Comment: NEGATIVE
Comment: NORMAL
Neisseria Gonorrhea: NEGATIVE
Trichomonas: NEGATIVE

## 2022-01-23 ENCOUNTER — Encounter (HOSPITAL_COMMUNITY): Payer: Self-pay | Admitting: Emergency Medicine

## 2022-01-23 ENCOUNTER — Other Ambulatory Visit: Payer: Self-pay

## 2022-01-23 ENCOUNTER — Ambulatory Visit (HOSPITAL_COMMUNITY)
Admission: EM | Admit: 2022-01-23 | Discharge: 2022-01-23 | Disposition: A | Discharge status: Home / Self Care | Payer: Self-pay | Attending: Emergency Medicine | Admitting: Emergency Medicine

## 2022-01-23 DIAGNOSIS — N898 Other specified noninflammatory disorders of vagina: Secondary | ICD-10-CM | POA: Insufficient documentation

## 2022-01-23 DIAGNOSIS — R300 Dysuria: Secondary | ICD-10-CM | POA: Insufficient documentation

## 2022-01-23 LAB — POCT URINALYSIS DIPSTICK, ED / UC
Bilirubin Urine: NEGATIVE
Glucose, UA: NEGATIVE mg/dL
Hgb urine dipstick: NEGATIVE
Ketones, ur: NEGATIVE mg/dL
Leukocytes,Ua: NEGATIVE
Nitrite: NEGATIVE
Protein, ur: NEGATIVE mg/dL
Specific Gravity, Urine: 1.02 (ref 1.005–1.030)
Urobilinogen, UA: 1 mg/dL (ref 0.0–1.0)
pH: 7 (ref 5.0–8.0)

## 2022-01-23 MED ORDER — METRONIDAZOLE 500 MG PO TABS: 500.0000 mg | ORAL_TABLET | Freq: Two times a day (BID) | ORAL | 0 refills | Status: DC

## 2022-01-23 MED ORDER — METRONIDAZOLE 500 MG PO TABS: 500.0000 mg | ORAL_TABLET | Freq: Once | ORAL | Status: DC

## 2022-01-23 NOTE — Discharge Instructions (Signed)
Today you are being treated prophylactically for  Bacterial vaginosis  ? ?Take Metronidazole 500 mg twice a day for 7 days, do not drink alcohol while using medication, this will make you feel sick  ? ?Urinalysis negative for bladder infection ? ?Bacterial vaginosis which results from an overgrowth of one on several organisms that are normally present in your vagina. Vaginosis is an inflammation of the vagina that can result in discharge, itching and pain. ? ?Labs pending 2-3 days, you will be contacted if positive for any sti and treatment will be sent to the pharmacy, you will have to return to the clinic if positive for gonorrhea to receive treatment  ? ?Please refrain from having sex until labs results, if positive please refrain from having sex until treatment complete and symptoms resolve  ? ?If positive for Chlamydia  gonorrhea or trichomoniasis please notify partner or partners so they may tested as well ? ?Moving forward, it is recommended you use some form of protection against the transmission of sti infections  such as condoms or dental dams with each sexual encounter   ? ? ?In addition: ?Avoid baths, hot tubs and whirlpool spas.  ?Don't use scented or harsh soaps ?Avoid irritants. These include scented tampons and pads. ?Wipe from front to back after using the toilet. ?Don't douche. Your vagina doesn't require cleansing other than normal bathing.  ?Use a condom.  ?Wear cotton underwear, this fabric absorbs some moisture.  ? ? ?  ?

## 2022-01-23 NOTE — ED Provider Notes (Signed)
?[facility] ? ? ? ?CSN: [id number] ?Arrival date & time: 01/23/22  1337 ? ? ?  ? ?History   ?Chief Complaint ?Chief Complaint  ?Patient presents with  ? Dysuria  ? ? ?HPI ?Louellen Haldeman is a 26 y.o. female.  ? ?Presents with vaginal odor, dysuria and pain during sexual intercourse for 3 weeks.  Endorses today she has attempted use of boric acid suppositories which have been temporarily helpful, symptoms returned after sexual encounters.  Sexually active, 1 partner, sometimes condom use.  No known exposure.  Denies urinary frequency, urgency, hematuria, abdominal pain or pressure, flank pain, new rash or lesions, vaginal discharge or itching.  ? ? ?Past Medical History:  ?Diagnosis Date  ? Preterm labor   ? ? ?Patient Active Problem List  ? Diagnosis Date Noted  ? Postpartum care following vaginal delivery 08/11/2021  ? Vaginal discharge 08/11/2021  ? Contraception management 08/11/2021  ? Alpha thalassemia silent carrier 01/28/2021  ? 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 ? ?Past Surgical History:  ?Procedure Laterality Date  ? APPENDECTOMY    ? ? ?OB History   ? ? Gravida  ?4  ? Para  ?3  ? Term  ?2  ? Preterm  ?1  ? AB  ?1  ? Living  ?3  ?  ? ? SAB  ?1  ? IAB  ?0  ? Ectopic  ?0  ? Multiple  ?0  ? Live Births  ?3  ?   ?  ?  ? ? ? ?Home Medications   ? ?Prior to Admission medications   ?Medication Sig Start Date End Date Taking? Authorizing Provider  ?ibuprofen (ADVIL) 600 MG tablet Take 1 tablet (600 mg total) by mouth every 6 (six) hours as needed. 07/08/21   Arabella Merles, CNM  ?Prenatal Vit-Fe Fumarate-FA (PRENATAL VITAMINS) 28-0.8 MG TABS Take 1 tablet by mouth daily. 01/07/21   Alric Seton, MD  ? ? ?Family History ?Family History  ?Problem Relation Age of Onset  ? Diabetes Maternal Grandfather   ? Healthy Mother   ? Healthy Father   ? ? ?Social History ?Social History  ? ?Tobacco Use  ? Smoking status: Former  ?  Types: Cigarettes  ?  Quit date: 01/08/2020  ?  Years since  quitting: 2.0  ? Smokeless tobacco: Never  ?Vaping Use  ? Vaping Use: Never used  ?Substance Use Topics  ? Alcohol use: Not Currently  ?  Comment: occasionally  ? Drug use: Never  ? ? ? ?Allergies   ?Patient has no known allergies. ? ? ?Review of Systems ?Review of Systems  ?Constitutional: Negative.   ?HENT: Negative.    ?Respiratory: Negative.    ?Cardiovascular: Negative.   ?Genitourinary:  Positive for dysuria, flank pain and vaginal pain. Negative for decreased urine volume, difficulty urinating, dyspareunia, enuresis, frequency, genital sores, hematuria, menstrual problem, pelvic pain, urgency, vaginal bleeding and vaginal discharge.  ? ? ?Physical Exam ?Triage Vital Signs ?ED Triage Vitals  ?Enc Vitals Group  ?   BP 01/23/22 1356 126/81  ?   Pulse Rate 01/23/22 1356 88  ?   Resp 01/23/22 1356 18  ?   Temp 01/23/22 1356 98.5 ?F (36.9 ?C)  ?   Temp Source 01/23/22 1356 Oral  ?   SpO2 01/23/22 1356 99 %  ?   Weight 01/23/22 1355 134 lb 0.6 oz (60.8 kg)  ?   Height 01/23/22 1355 5\' 3"  (1.6 m)  ?  Head Circumference --   ?   Peak Flow --   ?   Pain Score 01/23/22 1355 0  ?   Pain Loc --   ?   Pain Edu? --   ?   Excl. in GC? --   ? ?No data found. ? ?Updated Vital Signs ?BP 126/81 (BP Location: Right Arm)   Pulse 88   Temp 98.5 ?F (36.9 ?C) (Oral)   Resp 18   Ht 5\' 3"  (1.6 m)   Wt 134 lb 0.6 oz (60.8 kg)   SpO2 99%   BMI 23.74 kg/m?  ? ?Visual Acuity ?Right Eye Distance:   ?Left Eye Distance:   ?Bilateral Distance:   ? ?Right Eye Near:   ?Left Eye Near:    ?Bilateral Near:    ? ?Physical Exam ?Constitutional:   ?   Appearance: Normal appearance.  ?HENT:  ?   Head: Normocephalic.  ?Eyes:  ?   Extraocular Movements: Extraocular movements intact.  ?Pulmonary:  ?   Effort: Pulmonary effort is normal.  ?Genitourinary: ?   Comments: Deferred self collect vaginal swab ?Skin: ?   General: Skin is warm and dry.  ?Neurological:  ?   Mental Status: She is alert and oriented to person, place, and time. Mental status is  at baseline.  ?Psychiatric:     ?   Mood and Affect: Mood normal.     ?   Behavior: Behavior normal.  ? ? ? ?UC Treatments / Results  ?Labs ?(all labs ordered are listed, but only abnormal results are displayed) ?Labs Reviewed  ?POCT URINALYSIS DIPSTICK, ED / UC  ?CERVICOVAGINAL ANCILLARY ONLY  ? ? ?EKG ? ? ?Radiology ?No results found. ? ?Procedures ?Procedures (including critical care time) ? ?Medications Ordered in UC ?Medications - No data to display ? ?Initial Impression / Assessment and Plan / UC Course  ?I have reviewed the triage vital signs and the nursing notes. ? ?Pertinent labs & imaging results that were available during my care of the patient were reviewed by me and considered in my medical decision making (see chart for details). ? ?Dysuria ?Vaginal odor ? ?We will prophylactically treat for bacterial vaginosis based on symptomology, metronidazole 7-day course prescribed, advise discontinuation of any alcohol use while medication, urinalysis negative, STI labs pending, will treat per protocol, advised abstinence until labs results, treatment is complete and symptoms have resolved, may follow-up with urgent care as needed ?Final Clinical Impressions(s) / UC Diagnoses  ? ?Final diagnoses:  ?None  ? ?Discharge Instructions   ?None ?  ? ?ED Prescriptions   ?None ?  ? ?PDMP not reviewed this encounter. ?  ? , NP ?01/23/22 1433 ? ?

## 2022-01-23 NOTE — ED Triage Notes (Signed)
Pt reports vaginal discharge, odor and burning with urination x 3 weeks.  ?

## 2022-01-26 LAB — CERVICOVAGINAL ANCILLARY ONLY
Bacterial Vaginitis (gardnerella): POSITIVE — AB
Candida Glabrata: NEGATIVE
Candida Vaginitis: NEGATIVE
Chlamydia: NEGATIVE
Comment: NEGATIVE
Comment: NEGATIVE
Comment: NEGATIVE
Comment: NEGATIVE
Comment: NEGATIVE
Comment: NORMAL
Neisseria Gonorrhea: NEGATIVE
Trichomonas: NEGATIVE

## 2022-04-07 ENCOUNTER — Encounter (HOSPITAL_COMMUNITY): Payer: Self-pay

## 2022-04-07 ENCOUNTER — Ambulatory Visit (HOSPITAL_COMMUNITY)
Admission: RE | Admit: 2022-04-07 | Discharge: 2022-04-07 | Disposition: A | Discharge status: Home / Self Care | Payer: Self-pay | Source: Ambulatory Visit | Attending: Emergency Medicine | Admitting: Emergency Medicine

## 2022-04-07 VITALS — BP 101/56 | HR 65 | Temp 98.4°F | Resp 18

## 2022-04-07 DIAGNOSIS — N898 Other specified noninflammatory disorders of vagina: Secondary | ICD-10-CM

## 2022-04-07 MED ORDER — METRONIDAZOLE 500 MG PO TABS: 500.0000 mg | ORAL_TABLET | Freq: Two times a day (BID) | ORAL | 0 refills | Status: DC

## 2022-04-07 NOTE — ED Provider Notes (Signed)
[facility]    CSN: [id number] Arrival date & time: 04/07/22  1050      History   Chief Complaint Chief Complaint  Patient presents with   Vaginal Discharge    Entered by patient    HPI Monica [name] is a 26 y.o. female.   Patient presents with a Monica [name] thick vaginal discharge with fishy odor, vaginal itching and lower abdominal pain for 1 week.  Symptoms began after a unprotected sexual encounter.  1 female partner within the last 3 months, sometimes condom use.  Patient endorses partners infidelity.  Denies urinary symptoms, new rash or lesions, fever or chills.  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    Past Surgical History:  Procedure Laterality Date   APPENDECTOMY      OB History     Gravida  4   Para  3   Term  2   Preterm  1   AB  1   Living  3      SAB  1   IAB  0   Ectopic  0   Multiple  0   Live Births  3            Home Medications    Prior to Admission medications   Not on File    Family History Family History  Problem Relation Age of Onset   Diabetes Maternal Grandfather    Healthy Mother    Healthy Father     Social History Social History   Tobacco Use   Smoking status: Former    Types: Cigarettes    Quit date: 01/08/2020    Years since quitting: 2.2   Smokeless tobacco: Never  Vaping Use   Vaping Use: Never used  Substance Use Topics   Alcohol use: Yes    Comment: occasionally   Drug use: Never     Allergies   Patient has no known allergies.   Review of Systems Review of Systems  Constitutional: Negative.   Genitourinary:  Positive for pelvic pain and vaginal discharge. Negative for  decreased urine volume, difficulty urinating, dyspareunia, dysuria, enuresis, flank pain, frequency, genital sores, hematuria, menstrual problem, urgency, vaginal bleeding and vaginal pain.  Skin: Negative.   Neurological: Negative.     Physical Exam Triage Vital Signs ED Triage Vitals  Enc Vitals Group     BP 04/07/22 1111 (!) 101/56     Pulse Rate 04/07/22 1111 65     Resp 04/07/22 1111 18     Temp 04/07/22 1111 98.4 F (36.9 C)     Temp Source 04/07/22 1111 Oral     SpO2 04/07/22 1111 98 %     Weight --      Height --      Head Circumference --      Peak Flow --      Pain Score 04/07/22 1112 0     Pain Loc --      Pain Edu? --      Excl. in GC? --    No data found.  Updated Vital Signs BP (!) 101/56 (BP Location: Right Arm)   Pulse 65   Temp 98.4 F (36.9 C) (Oral)  Resp 18   SpO2 98%   Breastfeeding Yes   Visual Acuity Right Eye Distance:   Left Eye Distance:   Bilateral Distance:    Right Eye Near:   Left Eye Near:    Bilateral Near:     Physical Exam Constitutional:      Appearance: Normal appearance.  HENT:     Head: Normocephalic.  Eyes:     Extraocular Movements: Extraocular movements intact.  Pulmonary:     Effort: Pulmonary effort is normal.  Genitourinary:    Comments: Deferred  Skin:    General: Skin is warm and dry.  Neurological:     Mental Status: She is alert and oriented to person, place, and time. Mental status is at baseline.  Psychiatric:        Mood and Affect: Mood normal.        Behavior: Behavior normal.     UC Treatments / Results  Labs (all labs ordered are listed, but only abnormal results are displayed) Labs Reviewed  CERVICOVAGINAL ANCILLARY ONLY    EKG   Radiology No results found.  Procedures Procedures (including critical care time)  Medications Ordered in UC Medications - No data to display  Initial Impression / Assessment and Plan / UC Course  I have reviewed the triage vital signs and the  nursing notes.  Pertinent labs & imaging results that were available during my care of the patient were reviewed by me and considered in my medical decision making (see chart for details).  Vaginal discharge  Symptomatology consistent with BV, discussed with patient, will prophylactically treat, metronidazole 7-day course prescribed, recommended abstaining from alcohol while using the patient, STI labs pending, advised abstinence until all lab results, treatment is complete and symptoms have resolved, declined HIV and syphilis testing today due to finances, recommended getting remaining STI treatment done at the local health department, information given, may follow-up with this urgent care as needed Final Clinical Impressions(s) / UC Diagnoses   Final diagnoses:  Vaginal discharge     Discharge Instructions      Today you are being treated prophylactically for  Bacterial vaginosis   Take Metronidazole 500 mg twice a day for 7 days, do not drink alcohol while using medication, this will make you feel sick   Bacterial vaginosis which results from an overgrowth of one on several organisms that are normally present in your vagina. Vaginosis is an inflammation of the vagina that can result in discharge, itching and pain.  Labs pending 2-3 days, you will be contacted if positive for any sti and treatment will be sent to the pharmacy, you will have to return to the clinic if positive for gonorrhea to receive treatment   Please refrain from having sex until labs results, if positive please refrain from having sex until treatment complete and symptoms resolve   If positive for HIV, Syphilis, Chlamydia  gonorrhea or trichomoniasis please notify partner or partners so they may tested as well  Moving forward, it is recommended you use some form of protection against the transmission of sti infections  such as condoms or dental dams with each sexual encounter     In addition: Avoid baths, hot  tubs and whirlpool spas.  Don't use scented or harsh soaps Avoid irritants. These include scented tampons and pads. Wipe from front to back after using the toilet. Don't douche. Your vagina doesn't require cleansing other than normal bathing.  Use a condom.  Wear cotton underwear, this fabric absorbs some  moisture.        ED Prescriptions   None    PDMP not reviewed this encounter.   Valinda Hoar, NP 04/07/22 1134

## 2022-04-07 NOTE — ED Triage Notes (Signed)
Pt c/o white creaming vaginal discharge with itching and a fishy odor for a week. States has had un protective intercourse.

## 2022-04-07 NOTE — Discharge Instructions (Addendum)
Today you are being treated prophylactically for  Bacterial vaginosis   Take Metronidazole 500 mg twice a day for 7 days, do not drink alcohol while using medication, this will make you feel sick   Bacterial vaginosis which results from an overgrowth of one on several organisms that are normally present in your vagina. Vaginosis is an inflammation of the vagina that can result in discharge, itching and pain.  Labs pending 2-3 days, you will be contacted if positive for any sti and treatment will be sent to the pharmacy, you will have to return to the clinic if positive for gonorrhea to receive treatment   Please refrain from having sex until labs results, if positive please refrain from having sex until treatment complete and symptoms resolve   If positive for , Chlamydia  gonorrhea or trichomoniasis please notify partner or partners so they may tested as well  Moving forward, it is recommended you use some form of protection against the transmission of sti infections  such as condoms or dental dams with each sexual encounter     In addition: Avoid baths, hot tubs and whirlpool spas.  Don't use scented or harsh soaps Avoid irritants. These include scented tampons and pads. Wipe from front to back after using the toilet. Don't douche. Your vagina doesn't require cleansing other than normal bathing.  Use a condom.  Wear cotton underwear, this fabric absorbs some moisture.

## 2022-04-08 LAB — CERVICOVAGINAL ANCILLARY ONLY
Bacterial Vaginitis (gardnerella): POSITIVE — AB
Candida Glabrata: NEGATIVE
Candida Vaginitis: NEGATIVE
Chlamydia: NEGATIVE
Comment: NEGATIVE
Comment: NEGATIVE
Comment: NEGATIVE
Comment: NEGATIVE
Comment: NEGATIVE
Comment: NORMAL
Neisseria Gonorrhea: NEGATIVE
Trichomonas: NEGATIVE

## 2022-05-26 ENCOUNTER — Encounter (HOSPITAL_COMMUNITY): Payer: Self-pay

## 2022-05-26 ENCOUNTER — Ambulatory Visit (HOSPITAL_COMMUNITY)
Admission: RE | Admit: 2022-05-26 | Discharge: 2022-05-26 | Disposition: A | Discharge status: Home / Self Care | Payer: Self-pay | Source: Ambulatory Visit | Attending: Emergency Medicine | Admitting: Emergency Medicine

## 2022-05-26 VITALS — BP 108/64 | HR 64 | Temp 98.7°F | Resp 12

## 2022-05-26 DIAGNOSIS — N898 Other specified noninflammatory disorders of vagina: Secondary | ICD-10-CM | POA: Insufficient documentation

## 2022-05-26 LAB — POC URINE PREG, ED: Preg Test, Ur: NEGATIVE

## 2022-05-26 MED ORDER — METRONIDAZOLE 500 MG PO TABS: 500.0000 mg | ORAL_TABLET | Freq: Two times a day (BID) | ORAL | 0 refills | Status: DC

## 2022-05-26 NOTE — ED Provider Notes (Signed)
[facility]    CSN: [id number] Arrival date & time: 05/26/22  1425      History   Chief Complaint Chief Complaint  Patient presents with   Vaginal Discharge   APPT 1430    HPI Monica [name] is a 26 y.o. female.   Patient presents with brown fishy vaginal discharge and itching beginning 3 days ago.  Sexually active, sometimes condom use, no known exposure.  Denies urinary symptoms, lower abdominal pain or pressure, flank pain, fever or chills.  Has not attempted treatment of symptoms.  History of reoccurring BV.   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 Authorizing Provider  metroNIDAZOLE (FLAGYL) 500 MG tablet Take 1 tablet (500 mg total) by mouth 2 (two) times daily. 05/26/22  Yes Aubrionna Istre, Elita Boone, NP    Family History Family History  Problem Relation Age of Onset   Diabetes Maternal Grandfather    Healthy Mother    Healthy Father     Social History Social History   Tobacco Use   Smoking status: Former    Types: Cigarettes    Quit date: 01/08/2020    Years since quitting: 2.3   Smokeless tobacco: Never  Vaping Use   Vaping Use: Never used  Substance Use Topics   Alcohol use: Yes    Comment: occasionally   Drug use: Never     Allergies   Patient has no known allergies.   Review of  Systems Review of Systems  Constitutional: Negative.   Respiratory: Negative.    Cardiovascular: Negative.   Genitourinary:  Positive for vaginal discharge. Negative for decreased urine volume, difficulty urinating, dyspareunia, dysuria, enuresis, flank pain, frequency, genital sores, hematuria, menstrual problem, pelvic pain, urgency, vaginal bleeding and vaginal pain.  Skin: Negative.      Physical Exam Triage Vital Signs ED Triage Vitals  Enc Vitals Group     BP 05/26/22 1447 108/64     Pulse Rate 05/26/22 1447 64     Resp 05/26/22 1447 12     Temp 05/26/22 1447 98.7 F (37.1 C)     Temp Source 05/26/22 1447 Oral     SpO2 05/26/22 1447 95 %     Weight --      Height --      Head Circumference --      Peak Flow --      Pain Score 05/26/22 1450 5     Pain Loc --      Pain Edu? --      Excl. in GC? --  No data found.  Updated Vital Signs BP 108/64 (BP Location: Right Arm)   Pulse 64   Temp 98.7 F (37.1 C) (Oral)   Resp 12   LMP  (LMP Unknown)   SpO2 95%   Breastfeeding Yes   Visual Acuity Right Eye Distance:   Left Eye Distance:   Bilateral Distance:    Right Eye Near:   Left Eye Near:    Bilateral Near:     Physical Exam Constitutional:      Appearance: Normal appearance.  Eyes:     Extraocular Movements: Extraocular movements intact.  Pulmonary:     Effort: Pulmonary effort is normal.  Genitourinary:    Comments: deferred Skin:    General: Skin is warm and dry.  Neurological:     Mental Status: She is alert and oriented to person, place, and time. Mental status is at baseline.  Psychiatric:        Mood and Affect: Mood normal.        Behavior: Behavior normal.      UC Treatments / Results  Labs (all labs ordered are listed, but only abnormal results are displayed) Labs Reviewed  POC URINE PREG, ED  CERVICOVAGINAL ANCILLARY ONLY    EKG   Radiology No results found.  Procedures Procedures (including critical care  time)  Medications Ordered in UC Medications - No data to display  Initial Impression / Assessment and Plan / UC Course  I have reviewed the triage vital signs and the nursing notes.  Pertinent labs & imaging results that were available during my care of the patient were reviewed by me and considered in my medical decision making (see chart for details).  Vaginal discharge  We will treat prophylactically for BV based on history, metronidazole 7-day course prescribed, advised abstaining from alcohol while using medication, STI labs pending, will treat further per protocol, advised abstinence until all lab results, treatment is complete and symptoms have resolved, recommended over-the-counter boric acid suppositories if symptoms recur, if ineffective may follow-up with urgent care for reevaluation Final Clinical Impressions(s) / UC Diagnoses   Final diagnoses:  Vaginal discharge     Discharge Instructions      Today you are being treated prophylactically for  Bacterial vaginosis   Take Metronidazole 500 mg twice a day for 7 days, do not drink alcohol while using medication, this will make you feel sick   Bacterial vaginosis which results from an overgrowth of one on several organisms that are normally present in your vagina. Vaginosis is an inflammation of the vagina that can result in discharge, itching and pain.  If your symptoms recur you may attempt use of a over-the-counter boric acid suppository, if ineffective you may follow-up with urgent care  Labs pending 2-3 days, you will be contacted if positive for any sti and treatment will be sent to the pharmacy, you will have to return to the clinic if positive for gonorrhea to receive treatment   Please refrain from having sex until labs results, if positive please refrain from having sex until treatment complete and symptoms resolve   If positive for Chlamydia  gonorrhea or trichomoniasis please notify partner or partners so they  may tested as well  Moving forward, it is recommended you use some form of protection against the transmission of sti infections  such as condoms or dental dams with each sexual encounter     In addition: Avoid baths, hot tubs and whirlpool spas.  Don't use scented or harsh  soaps Avoid irritants. These include scented tampons and pads. Wipe from front to back after using the toilet. Don't douche. Your vagina doesn't require cleansing other than normal bathing.  Use a condom.  Wear cotton underwear, this fabric absorbs some moisture.         ED Prescriptions     Medication Sig Dispense Auth. Provider   metroNIDAZOLE (FLAGYL) 500 MG tablet Take 1 tablet (500 mg total) by mouth 2 (two) times daily. 14 tablet Darlean Warmoth, Leitha Schuller, NP      PDMP not reviewed this encounter.   Hans Eden, NP 05/26/22 1513

## 2022-05-26 NOTE — Discharge Instructions (Signed)
Today you are being treated prophylactically for  Bacterial vaginosis   Take Metronidazole 500 mg twice a day for 7 days, do not drink alcohol while using medication, this will make you feel sick   Bacterial vaginosis which results from an overgrowth of one on several organisms that are normally present in your vagina. Vaginosis is an inflammation of the vagina that can result in discharge, itching and pain.  If your symptoms recur you may attempt use of a over-the-counter boric acid suppository, if ineffective you may follow-up with urgent care  Labs pending 2-3 days, you will be contacted if positive for any sti and treatment will be sent to the pharmacy, you will have to return to the clinic if positive for gonorrhea to receive treatment   Please refrain from having sex until labs results, if positive please refrain from having sex until treatment complete and symptoms resolve   If positive for Chlamydia  gonorrhea or trichomoniasis please notify partner or partners so they may tested as well  Moving forward, it is recommended you use some form of protection against the transmission of sti infections  such as condoms or dental dams with each sexual encounter     In addition: Avoid baths, hot tubs and whirlpool spas.  Don't use scented or harsh soaps Avoid irritants. These include scented tampons and pads. Wipe from front to back after using the toilet. Don't douche. Your vagina doesn't require cleansing other than normal bathing.  Use a condom.  Wear cotton underwear, this fabric absorbs some moisture.

## 2022-05-26 NOTE — ED Triage Notes (Signed)
Patient c/o vaginal discharge x 3 days.   Patient denies N/V or pain with intercourse.   Patient endorses " brownish, fishy" discharge. Patient endorses vaginal itching.   Patient endorses mid lower ABD cramping.   Patient hasn't taken any medications for symptoms.

## 2022-05-27 LAB — CERVICOVAGINAL ANCILLARY ONLY
Bacterial Vaginitis (gardnerella): POSITIVE — AB
Candida Glabrata: NEGATIVE
Candida Vaginitis: POSITIVE — AB
Chlamydia: NEGATIVE
Comment: NEGATIVE
Comment: NEGATIVE
Comment: NEGATIVE
Comment: NEGATIVE
Comment: NEGATIVE
Comment: NORMAL
Neisseria Gonorrhea: NEGATIVE
Trichomonas: NEGATIVE

## 2022-05-28 ENCOUNTER — Telehealth (HOSPITAL_COMMUNITY): Payer: Self-pay | Admitting: Emergency Medicine

## 2022-05-28 MED ORDER — FLUCONAZOLE 150 MG PO TABS: 150.0000 mg | ORAL_TABLET | Freq: Once | ORAL | 0 refills | Status: AC

## 2022-10-21 ENCOUNTER — Ambulatory Visit (HOSPITAL_COMMUNITY)
Admission: EM | Admit: 2022-10-21 | Discharge: 2022-10-21 | Disposition: A | Discharge status: Home / Self Care | Payer: Self-pay | Attending: Emergency Medicine | Admitting: Emergency Medicine

## 2022-10-21 ENCOUNTER — Encounter (HOSPITAL_COMMUNITY): Payer: Self-pay

## 2022-10-21 DIAGNOSIS — N898 Other specified noninflammatory disorders of vagina: Secondary | ICD-10-CM | POA: Insufficient documentation

## 2022-10-21 MED ORDER — FLUCONAZOLE 150 MG PO TABS: 150.0000 mg | ORAL_TABLET | Freq: Every day | ORAL | 0 refills | Status: AC

## 2022-10-21 MED ORDER — METRONIDAZOLE 500 MG PO TABS: 500.0000 mg | ORAL_TABLET | Freq: Two times a day (BID) | ORAL | 0 refills | Status: DC

## 2022-10-21 NOTE — Discharge Instructions (Addendum)
Today you are being treated prophylactically for  Bacterial vaginosis and yeast  Take Metronidazole 500 mg twice a day for 7 days, do not drink alcohol while using medication, this will make you feel sick   Take one diflucan tablet when you receive medication then after completion of metronidazole take second diflucan tablet    Bacterial vaginosis and yeast results from an overgrowth of one on several organisms that are normally present in your vagina. Vaginosis is an inflammation of the vagina that can result in discharge, itching and pain.  Labs pending 2-3 days, you will be contacted if positive for any sti and treatment will be sent to the pharmacy, you will have to return to the clinic if positive for gonorrhea to receive treatment   Please refrain from having sex until labs results, if positive please refrain from having sex until treatment complete and symptoms resolve   If positive for  Chlamydia  gonorrhea or trichomoniasis please notify partner or partners so they may tested as well  Moving forward, it is recommended you use some form of protection against the transmission of sti infections  such as condoms or dental dams with each sexual encounter     In addition: Avoid baths, hot tubs and whirlpool spas.  Don't use scented or harsh soaps Avoid irritants. These include scented tampons and pads. Wipe from front to back after using the toilet. Don't douche. Your vagina doesn't require cleansing other than normal bathing.  Use a condom.  Wear cotton underwear, this fabric absorbs some moisture.

## 2022-10-21 NOTE — ED Triage Notes (Signed)
Pt is here for vaginal odor, itching , discharge and fishy odor x1wk

## 2022-10-21 NOTE — ED Provider Notes (Signed)
[facility]    CSN: [id number] Arrival date & time: 10/21/22  1233      History   Chief Complaint Chief Complaint  Patient presents with   Exposure to STD    HPI Dorlis Judice is a 26 y.o. female.   Patient presents for evaluation of thick Tayja Manzer vaginal discharge with fishy odor and vaginal irritation for at least 7 days.  Has attempted use of boric acid suppositories which seem to help, endorses menstruation on 10/15/2022 which worsen symptoms.  Sexually active, 1 partner, no known exposure.  Denies urinary symptoms, new rash or lesions, lower abdominal pain or pressure, flank pain or fevers.     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 Authorizing Provider  metroNIDAZOLE (FLAGYL) 500 MG tablet Take 1 tablet (500 mg total) by mouth 2 (two) times daily. 05/26/22   Valinda Hoar, NP    Family History Family History  Problem Relation Age of Onset   Diabetes Maternal Grandfather    Healthy Mother    Healthy Father     Social History Social History   Tobacco Use   Smoking status: Former    Types: Cigarettes    Quit date: 01/08/2020    Years since quitting: 2.7   Smokeless tobacco: Never  Vaping Use   Vaping Use: Never used  Substance Use Topics   Alcohol use: Yes    Comment: occasionally   Drug  use: Never     Allergies   Patient has no known allergies.   Review of Systems Review of Systems  Constitutional: Negative.   Respiratory: Negative.    Cardiovascular: Negative.   Genitourinary:  Positive for vaginal discharge. Negative for decreased urine volume, difficulty urinating, dyspareunia, dysuria, enuresis, flank pain, frequency, genital sores, hematuria, menstrual problem, pelvic pain, urgency, vaginal bleeding and vaginal pain.  Skin: Negative.      Physical Exam Triage Vital Signs ED Triage Vitals  Enc Vitals Group     BP 10/21/22 1451 110/70     Pulse Rate 10/21/22 1451 97     Resp 10/21/22 1451 12     Temp 10/21/22 1451 98.5 F (36.9 C)     Temp Source 10/21/22 1451 Oral     SpO2 10/21/22 1451 98 %     Weight --      Height --      Head Circumference --      Peak Flow --      Pain Score 10/21/22 1449 0     Pain Loc --  Pain Edu? --      Excl. in [place]? --    No data found.  Updated Vital Signs BP 110/70 (BP Location: Right Arm)   Pulse 97   Temp 98.5 F (36.9 C) (Oral)   Resp 12   LMP 10/15/2022   SpO2 98%   Visual Acuity Right Eye Distance:   Left Eye Distance:   Bilateral Distance:    Right Eye Near:   Left Eye Near:    Bilateral Near:     Physical Exam Constitutional:      Appearance: Normal appearance.  Eyes:     Extraocular Movements: Extraocular movements intact.  Pulmonary:     Effort: Pulmonary effort is normal.  Genitourinary:    Comments: deferred Neurological:     Mental Status: She is alert and oriented to person, place, and time. Mental status is at baseline.  Psychiatric:        Mood and Affect: Mood normal.        Behavior: Behavior normal.      UC Treatments / Results  Labs (all labs ordered are listed, but only abnormal results are displayed) Labs Reviewed  CERVICOVAGINAL ANCILLARY ONLY    EKG   Radiology No results found.  Procedures Procedures (including critical care time)  Medications  Ordered in UC Medications - No data to display  Initial Impression / Assessment and Plan / UC Course  I have reviewed the triage vital signs and the nursing notes.  Pertinent labs & imaging results that were available during my care of the patient were reviewed by me and considered in my medical decision making (see chart for details).  Vaginal discharge   Treating prophylactically for bacterial vaginosis and yeast based on history and symptomology, metronidazole and Diflucan prescribed, discussed administration and advised abstaining from alcohol during use of medicine, STI labs pending will treat per protocol, advised abstinence until lab results, and/or treatment is complete, advised condom use during all sexual encounters moving, may follow-up with urgent care as needed  Final Clinical Impressions(s) / UC Diagnoses   Final diagnoses:  Vaginal discharge     Discharge Instructions      Today you are being treated prophylactically for  Bacterial vaginosis   Take Metronidazole 500 mg twice a day for 7 days, do not drink alcohol while using medication, this will make you feel sick   Bacterial vaginosis which results from an overgrowth of one on several organisms that are normally present in your vagina. Vaginosis is an inflammation of the vagina that can result in discharge, itching and pain.  Labs pending 2-3 days, you will be contacted if positive for any sti and treatment will be sent to the pharmacy, you will have to return to the clinic if positive for gonorrhea to receive treatment   Please refrain from having sex until labs results, if positive please refrain from having sex until treatment complete and symptoms resolve   If positive for  Chlamydia  gonorrhea or trichomoniasis please notify partner or partners so they may tested as well  Moving forward, it is recommended you use some form of protection against the transmission of sti infections  such as condoms or dental dams  with each sexual encounter     In addition: Avoid baths, hot tubs and whirlpool spas.  Don't use scented or harsh soaps Avoid irritants. These include scented tampons and pads. Wipe from front to back after using the toilet. Don't douche. Your vagina doesn't require cleansing other than  normal bathing.  Use a condom.  Wear cotton underwear, this fabric absorbs some moisture.        ED Prescriptions   None    PDMP not reviewed this encounter.   Valinda Hoar, NP 10/21/22 1506

## 2022-10-22 ENCOUNTER — Telehealth (HOSPITAL_COMMUNITY): Payer: Self-pay | Admitting: Emergency Medicine

## 2022-10-22 LAB — CERVICOVAGINAL ANCILLARY ONLY
Bacterial Vaginitis (gardnerella): POSITIVE — AB
Candida Glabrata: NEGATIVE
Candida Vaginitis: NEGATIVE
Chlamydia: NEGATIVE
Comment: NEGATIVE
Comment: NEGATIVE
Comment: NEGATIVE
Comment: NEGATIVE
Comment: NEGATIVE
Comment: NORMAL
Neisseria Gonorrhea: NEGATIVE
Trichomonas: NEGATIVE

## 2022-11-25 ENCOUNTER — Ambulatory Visit (HOSPITAL_COMMUNITY)
Admission: EM | Admit: 2022-11-25 | Discharge: 2022-11-25 | Disposition: A | Discharge status: Home / Self Care | Payer: Self-pay | Attending: Emergency Medicine | Admitting: Emergency Medicine

## 2022-11-25 ENCOUNTER — Encounter (HOSPITAL_COMMUNITY): Payer: Self-pay | Admitting: Emergency Medicine

## 2022-11-25 DIAGNOSIS — N898 Other specified noninflammatory disorders of vagina: Secondary | ICD-10-CM | POA: Insufficient documentation

## 2022-11-25 LAB — POCT URINALYSIS DIPSTICK, ED / UC
Bilirubin Urine: NEGATIVE
Glucose, UA: NEGATIVE mg/dL
Ketones, ur: NEGATIVE mg/dL
Leukocytes,Ua: NEGATIVE
Nitrite: NEGATIVE
Protein, ur: NEGATIVE mg/dL
Specific Gravity, Urine: 1.025 (ref 1.005–1.030)
Urobilinogen, UA: 0.2 mg/dL (ref 0.0–1.0)
pH: 5.5 (ref 5.0–8.0)

## 2022-11-25 LAB — POC URINE PREG, ED: Preg Test, Ur: NEGATIVE

## 2022-11-25 MED ORDER — FLUCONAZOLE 150 MG PO TABS: 150.0000 mg | ORAL_TABLET | Freq: Once | ORAL | 0 refills | Status: DC | PRN

## 2022-11-25 NOTE — Discharge Instructions (Addendum)
We will call you if anything on your swab returns positive. Please abstain from sexual intercourse until your results return, and for 7 days after treatment.  I am treating you for possible yeast infection. Take the medicine as prescribed (today and Saturday)   Increase water intake to at least 64 oz daily  Please follow up with women's clinic

## 2022-11-25 NOTE — ED Provider Notes (Signed)
[place]    CSN: [id number] Arrival date & time: 11/25/22  0827      History   Chief Complaint Chief Complaint  Patient presents with   Vaginal Discharge    Entered by patient   Dysuria    HPI Monica [name] is a 27 y.o. female.  Here with several day history of increased vaginal discharge Vaginal itching. No rash. Some intermittent abdominal cramping. None today  Reports dysuria for a few days, urine with strong odor Reports has not been drinking much water  Unprotected intercourse recently, unknown exposure to STD LMP 12/26  Here last month, positive BV. 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 Authorizing Provider  fluconazole (DIFLUCAN) 150 MG tablet Take 1 tablet (150 mg total) by mouth once as needed for up to 2 doses (take one pill on day 1, and the second pill 3 days later). 11/25/22  Yes Shivaan Tierno, Wells Guiles, PA-C    Family History Family History  Problem Relation Age of Onset   Diabetes Maternal Grandfather    Healthy Mother    Healthy Father     Social History Social History   Tobacco Use   Smoking status: Former    Types: Cigarettes    Quit date: 01/08/2020    Years since quitting: 2.8   Smokeless tobacco: Never  Vaping Use    Vaping Use: Never used  Substance Use Topics   Alcohol use: Yes    Comment: occasionally   Drug use: Never     Allergies   Patient has no known allergies.   Review of Systems Review of Systems As per HPI  Physical Exam Triage Vital Signs ED Triage Vitals  Enc Vitals Group     BP 11/25/22 0927 99/66     Pulse Rate 11/25/22 0927 64     Resp 11/25/22 0927 14     Temp 11/25/22 0927 97.9 F (36.6 C)     Temp Source 11/25/22 0927 Oral     SpO2 11/25/22 0927 98 %     Weight --      Height --      Head Circumference --      Peak Flow --      Pain Score 11/25/22 0926 4     Pain Loc --      Pain Edu? --      Excl. in [place]? --    No data found.  Updated Vital Signs BP 99/66 (BP Location: Left Arm)  Pulse 64   Temp 97.9 F (36.6 C) (Oral)   Resp 14   LMP 11/03/2022   SpO2 98%   Physical Exam Vitals and nursing note reviewed.  Constitutional:      General: She is not in acute distress.    Appearance: Normal appearance.  HENT:     Mouth/Throat:     Pharynx: Oropharynx is clear.  Cardiovascular:     Rate and Rhythm: Normal rate and regular rhythm.     Pulses: Normal pulses.  Pulmonary:     Effort: Pulmonary effort is normal.  Abdominal:     Tenderness: There is no abdominal tenderness.  Neurological:     Mental Status: She is alert and oriented to person, place, and time.     UC Treatments / Results  Labs (all labs ordered are listed, but only abnormal results are displayed) Labs Reviewed  POCT URINALYSIS DIPSTICK, ED / UC - Abnormal; Notable for the following components:      Result Value   Hgb urine dipstick TRACE (*)    All other components within normal limits  POC URINE PREG, ED  CERVICOVAGINAL ANCILLARY ONLY    EKG  Radiology No results found.  Procedures Procedures   Medications Ordered in UC Medications - No data to display  Initial Impression / Assessment and Plan / UC Course  I have reviewed the triage vital signs and the nursing  notes.  Pertinent labs & imaging results that were available during my care of the patient were reviewed by me and considered in my medical decision making (see chart for details).  UPT negative UA trace hgb, no sign of infection Dysuria may be due to concentrated urine from poor water intake Discussed increase to at least 64 oz daily  Cytology swab is pending. Treat positive result if indicated Will cover for yeast given vaginal itching, two dose fluconazole Provided women's clinic for follow up Return precautions discussed. Patient agrees to plan  Final Clinical Impressions(s) / UC Diagnoses   Final diagnoses:  Vaginal discharge  Vagina itching     Discharge Instructions      We will call you if anything on your swab returns positive. Please abstain from sexual intercourse until your results return, and for 7 days after treatment.  I am treating you for possible yeast infection. Take the medicine as prescribed (today and Saturday)   Increase water intake to at least 64 oz daily  Please follow up with women's clinic     ED Prescriptions     Medication Sig Dispense Auth. Provider   fluconazole (DIFLUCAN) 150 MG tablet Take 1 tablet (150 mg total) by mouth once as needed for up to 2 doses (take one pill on day 1, and the second pill 3 days later). 2 tablet Geffrey Michaelsen, Wells Guiles, PA-C      PDMP not reviewed this encounter.   Aneta Hendershott, Wells Guiles, PA-C 11/25/22 1101

## 2022-11-25 NOTE — ED Triage Notes (Signed)
Pt reports that treated for BV 4 weeks ago. Reports that vaginal discharge hasn't gone away completely and now discharge is more with abd pains and dysuria. Adds that urine has strong foul odor.  Took tylenol for pain.

## 2022-11-26 LAB — CERVICOVAGINAL ANCILLARY ONLY
Bacterial Vaginitis (gardnerella): NEGATIVE
Candida Glabrata: NEGATIVE
Candida Vaginitis: POSITIVE — AB
Chlamydia: NEGATIVE
Comment: NEGATIVE
Comment: NEGATIVE
Comment: NEGATIVE
Comment: NEGATIVE
Comment: NEGATIVE
Comment: NORMAL
Neisseria Gonorrhea: NEGATIVE
Trichomonas: NEGATIVE

## 2022-12-21 ENCOUNTER — Ambulatory Visit: Payer: Self-pay | Admitting: Nurse Practitioner

## 2023-03-23 ENCOUNTER — Inpatient Hospital Stay (HOSPITAL_COMMUNITY): Payer: Self-pay

## 2023-03-23 ENCOUNTER — Encounter (HOSPITAL_COMMUNITY): Payer: Self-pay | Admitting: *Deleted

## 2023-03-23 ENCOUNTER — Inpatient Hospital Stay (HOSPITAL_COMMUNITY)
Admission: AD | Admit: 2023-03-23 | Discharge: 2023-03-23 | Disposition: A | Discharge status: Home / Self Care | Payer: Self-pay | Attending: Obstetrics & Gynecology | Admitting: Obstetrics & Gynecology

## 2023-03-23 DIAGNOSIS — Z6720 Type B blood, Rh positive: Secondary | ICD-10-CM

## 2023-03-23 DIAGNOSIS — O210 Mild hyperemesis gravidarum: Secondary | ICD-10-CM | POA: Insufficient documentation

## 2023-03-23 DIAGNOSIS — O219 Vomiting of pregnancy, unspecified: Secondary | ICD-10-CM

## 2023-03-23 DIAGNOSIS — O418X10 Other specified disorders of amniotic fluid and membranes, first trimester, not applicable or unspecified: Secondary | ICD-10-CM

## 2023-03-23 DIAGNOSIS — Z87891 Personal history of nicotine dependence: Secondary | ICD-10-CM | POA: Insufficient documentation

## 2023-03-23 DIAGNOSIS — O208 Other hemorrhage in early pregnancy: Secondary | ICD-10-CM | POA: Insufficient documentation

## 2023-03-23 DIAGNOSIS — Z3A01 Less than 8 weeks gestation of pregnancy: Secondary | ICD-10-CM | POA: Insufficient documentation

## 2023-03-23 DIAGNOSIS — O26851 Spotting complicating pregnancy, first trimester: Secondary | ICD-10-CM

## 2023-03-23 LAB — URINALYSIS, ROUTINE W REFLEX MICROSCOPIC
Bilirubin Urine: NEGATIVE
Glucose, UA: NEGATIVE mg/dL
Ketones, ur: NEGATIVE mg/dL
Nitrite: NEGATIVE
Protein, ur: NEGATIVE mg/dL
Specific Gravity, Urine: 1.021 (ref 1.005–1.030)
pH: 6 (ref 5.0–8.0)

## 2023-03-23 LAB — POCT PREGNANCY, URINE: Preg Test, Ur: POSITIVE — AB

## 2023-03-23 LAB — CBC
HCT: 35.7 % — ABNORMAL LOW (ref 36.0–46.0)
Hemoglobin: 12 g/dL (ref 12.0–15.0)
MCH: 28.6 pg (ref 26.0–34.0)
MCHC: 33.6 g/dL (ref 30.0–36.0)
MCV: 85.2 fL (ref 80.0–100.0)
Platelets: 275 10*3/uL (ref 150–400)
RBC: 4.19 MIL/uL (ref 3.87–5.11)
RDW: 12.6 % (ref 11.5–15.5)
WBC: 8.8 10*3/uL (ref 4.0–10.5)
nRBC: 0 % (ref 0.0–0.2)

## 2023-03-23 LAB — WET PREP, GENITAL
Clue Cells Wet Prep HPF POC: NONE SEEN
Sperm: NONE SEEN
Trich, Wet Prep: NONE SEEN
WBC, Wet Prep HPF POC: >=10 — AB (ref <10)
Yeast Wet Prep HPF POC: NONE SEEN

## 2023-03-23 LAB — HCG, QUANTITATIVE, PREGNANCY: hCG, Beta Chain, Quant, S: 108907 m[IU]/mL — ABNORMAL HIGH (ref <5)

## 2023-03-23 MED ORDER — PROMETHAZINE HCL 25 MG PO TABS
25.0000 mg | ORAL_TABLET | Freq: Once | ORAL | Status: AC
  Administered 2023-03-23: 25 mg via ORAL
  Filled 2023-03-23: qty 1

## 2023-03-23 MED ORDER — PROMETHAZINE HCL 25 MG PO TABS: 25.0000 mg | ORAL_TABLET | Freq: Four times a day (QID) | ORAL | 3 refills | Status: DC | PRN

## 2023-03-23 NOTE — MAU Provider Note (Signed)
History     CSN: [id number]  Arrival date and time: 03/23/23 1234   Event Date/Time   First Provider Initiated Contact with Patient 03/23/23 1631      Chief Complaint  Patient presents with   Vaginal Bleeding   Abdominal Pain   Emesis   Nausea   Possible Pregnancy   Monica [name] is a 27 y.o. [phone] at [redacted]w[redacted]d who presents today with nausea/vomIting, cramping and spotting.   Vaginal Bleeding The patient's primary symptoms include vaginal bleeding. This is a new problem. The current episode started yesterday. The problem has been unchanged. The pain is moderate. The problem affects both sides. The vaginal discharge was bloody. The vaginal bleeding is spotting. She has not been passing clots. She has not been passing tissue. Exacerbated by: vomiting.  Emesis  This is a new problem. Episode onset: 2 weeks ago. The problem occurs 2 to 4 times per day. The emesis has an appearance of stomach contents. There has been no fever. Risk factors: pregnancy. She has tried nothing for the symptoms.    OB History     Gravida  5   Para  3   Term  2   Preterm  1   AB  1   Living  3      SAB  1   IAB  0   Ectopic  0   Multiple  0   Live Births  3           Past Medical History:  Diagnosis Date   Preterm labor     Past Surgical History:  Procedure Laterality Date   APPENDECTOMY      Family History  Problem Relation Age of Onset   Diabetes Maternal Grandfather    Healthy Mother    Healthy Father     Social History   Tobacco Use   Smoking status: Former    Types: Cigarettes    Quit date: 01/08/2020    Years since quitting: 3.2   Smokeless tobacco: Never  Vaping Use   Vaping Use: Never used  Substance Use Topics   Alcohol use: Not Currently    Comment: occasionally   Drug use: Never    Allergies: No Known Allergies  Medications Prior to Admission  Medication Sig Dispense Refill Last Dose   fluconazole (DIFLUCAN) 150 MG tablet Take  1 tablet (150 mg total) by mouth once as needed for up to 2 doses (take one pill on day 1, and the second pill 3 days later). 2 tablet 0     Review of Systems  All other systems reviewed and are negative.  Physical Exam   Blood pressure 122/64, pulse 79, temperature 98.2 F (36.8 C), temperature source Oral, resp. rate 16, height 5\' 3"  (1.6 m), weight 64.5 kg, last menstrual period 01/27/2023, SpO2 98 %, currently breastfeeding.  Physical Exam Vitals and nursing note reviewed.  Constitutional:      General: She is not in acute distress. HENT:     Head: Normocephalic.  Eyes:     Pupils: Pupils are equal, round, and reactive to light.  Cardiovascular:     Rate and Rhythm: Normal rate.  Pulmonary:     Effort: Pulmonary effort is normal.  Abdominal:     Palpations: Abdomen is soft.     Tenderness: There is no abdominal tenderness.  Skin:    General: Skin is warm and dry.  Neurological:     Mental Status: She is alert and oriented to  person, place, and time.  Psychiatric:        Mood and Affect: Mood normal.        Behavior: Behavior normal.      Results for orders placed or performed during the hospital encounter of 03/23/23 (from the past 24 hour(s))  Pregnancy, urine POC     Status: Abnormal   Collection Time: 03/23/23  1:09 PM  Result Value Ref Range   Preg Test, Ur POSITIVE (A) NEGATIVE  Urinalysis, Routine w reflex microscopic -Urine, Clean Catch     Status: Abnormal   Collection Time: 03/23/23  1:20 PM  Result Value Ref Range   Color, Urine YELLOW YELLOW   APPearance HAZY (A) CLEAR   Specific Gravity, Urine 1.021 1.005 - 1.030   pH 6.0 5.0 - 8.0   Glucose, UA NEGATIVE NEGATIVE mg/dL   Hgb urine dipstick SMALL (A) NEGATIVE   Bilirubin Urine NEGATIVE NEGATIVE   Ketones, ur NEGATIVE NEGATIVE mg/dL   Protein, ur NEGATIVE NEGATIVE mg/dL   Nitrite NEGATIVE NEGATIVE   Leukocytes,Ua SMALL (A) NEGATIVE   RBC / HPF 0-5 0 - 5 RBC/hpf   WBC, UA 6-10 0 - 5 WBC/hpf    Bacteria, UA FEW (A) NONE SEEN   Squamous Epithelial / HPF 0-5 0 - 5 /HPF   Mucus PRESENT   CBC     Status: Abnormal   Collection Time: 03/23/23  2:38 PM  Result Value Ref Range   WBC 8.8 4.0 - 10.5 K/uL   RBC 4.19 3.87 - 5.11 MIL/uL   Hemoglobin 12.0 12.0 - 15.0 g/dL   HCT 16.1 (L) 09.6 - 04.5 %   MCV 85.2 80.0 - 100.0 fL   MCH 28.6 26.0 - 34.0 pg   MCHC 33.6 30.0 - 36.0 g/dL   RDW 40.9 81.1 - 91.4 %   Platelets 275 150 - 400 K/uL   nRBC 0.0 0.0 - 0.2 %  hCG, quantitative, pregnancy     Status: Abnormal   Collection Time: 03/23/23  2:38 PM  Result Value Ref Range   hCG, Beta Chain, Quant, S 108,907 (H) <5 mIU/mL  Wet prep, genital     Status: Abnormal   Collection Time: 03/23/23  3:20 PM   Specimen: PATH Cytology Cervicovaginal Ancillary Only  Result Value Ref Range   Yeast Wet Prep HPF POC NONE SEEN NONE SEEN   Trich, Wet Prep NONE SEEN NONE SEEN   Clue Cells Wet Prep HPF POC NONE SEEN NONE SEEN   WBC, Wet Prep HPF POC >=10 (A) <10   Sperm NONE SEEN     US OB Comp Less 14 Wks  Result Date: 03/23/2023 CLINICAL DATA:  Pregnant patient. Spotting. Patient is 7 weeks and 6 days pregnant based on her last menstrual period. EXAM: OBSTETRIC <14 WK ULTRASOUND TECHNIQUE: Transabdominal ultrasound was performed for evaluation of the gestation as well as the maternal uterus and adnexal regions. COMPARISON:  None Available. FINDINGS: Intrauterine gestational sac: Single Yolk sac:  Visualized. Embryo:  Visualized. Cardiac Activity: Visualized. Heart Rate: 157 bpm CRL:   14.8 mm   7 w 6 d                  [place] EDC: 11/03/2023 Subchorionic hemorrhage:  Small subchronic hemorrhage. Maternal uterus/adnexae: No uterine mass. Cervix unremarkable. Normal ovaries and adnexa. No pelvic free fluid. IMPRESSION: 1. Single live intrauterine pregnancy with a measured gestational age of [redacted] weeks and 6 days. 2. Small subchronic hemorrhage.  No other pregnancy complication.  Electronically Signed   By: Amie Portland  M.D.   On: 03/23/2023 16:02    MAU Course  Procedures  MDM Patient has had 25mg  Phenergan PO here. She reports she is feeling better. No emesis while here.   Assessment and Plan   1. Nausea/vomiting in pregnancy   2. Spotting affecting pregnancy in first trimester   3. [redacted] weeks gestation of pregnancy   4. Type B blood, Rh positive   5. Subchorionic hematoma in first trimester, single or unspecified fetus    DC home in stable condition   1st Trimester precautions  Bleeding precautions RX: phenergan 25mg  PRN #30 with 3RF Return to MAU as needed FU with OB as planned   Follow-up Information     [facility]. Schedule an appointment as soon as possible for a visit.   Specialty: Obstetrics and Gynecology Contact information: [address] [place] [address] [phone]               Thressa Sheller DNP, CNM  03/23/23  6:36 PM

## 2023-03-23 NOTE — MAU Note (Signed)
Monica [name] is a 27 y.o. at Unknown here in MAU reporting: hasn't been able to hold down food or drink. Very nauseated.started cramping and spotting this morning, light red.is preg.  Having a lot of dizziness LMP: 3/20 Onset of complaint: couple wks ago Pain score: mild Vitals:   03/23/23 1301  BP: 106/64  Pulse: 79  Resp: 16  Temp: 98.2 F (36.8 C)  SpO2: 99%      Lab orders placed from triage:  UA, UPT, orthostatics

## 2023-03-23 NOTE — MAU Note (Signed)
Pt reports continued nausea but reports she has not vomited since medication.  Provider aware

## 2023-03-24 LAB — GC/CHLAMYDIA PROBE AMP (~~LOC~~) NOT AT ARMC
Chlamydia: NEGATIVE
Comment: NEGATIVE
Comment: NORMAL
Neisseria Gonorrhea: NEGATIVE

## 2023-03-24 LAB — CULTURE, OB URINE

## 2023-03-25 LAB — CULTURE, OB URINE: Culture: 60000 — AB

## 2023-03-26 ENCOUNTER — Other Ambulatory Visit: Payer: Self-pay | Admitting: Student

## 2023-03-26 DIAGNOSIS — O2341 Unspecified infection of urinary tract in pregnancy, first trimester: Secondary | ICD-10-CM

## 2023-03-26 MED ORDER — CEFADROXIL 500 MG PO CAPS
500.0000 mg | ORAL_CAPSULE | Freq: Two times a day (BID) | ORAL | 0 refills | Status: AC
Start: 2023-03-26 — End: 2023-04-02

## 2023-04-05 ENCOUNTER — Other Ambulatory Visit: Payer: Self-pay

## 2023-04-05 ENCOUNTER — Encounter (HOSPITAL_COMMUNITY): Payer: Self-pay | Admitting: Obstetrics & Gynecology

## 2023-04-05 ENCOUNTER — Inpatient Hospital Stay (HOSPITAL_COMMUNITY)
Admission: AD | Admit: 2023-04-05 | Discharge: 2023-04-05 | Disposition: A | Discharge status: Home / Self Care | Payer: Self-pay | Attending: Obstetrics & Gynecology | Admitting: Obstetrics & Gynecology

## 2023-04-05 DIAGNOSIS — O219 Vomiting of pregnancy, unspecified: Secondary | ICD-10-CM

## 2023-04-05 DIAGNOSIS — Z3A09 9 weeks gestation of pregnancy: Secondary | ICD-10-CM

## 2023-04-05 LAB — URINALYSIS, ROUTINE W REFLEX MICROSCOPIC
Bacteria, UA: NONE SEEN
Bilirubin Urine: NEGATIVE
Glucose, UA: NEGATIVE mg/dL
Hgb urine dipstick: NEGATIVE
Ketones, ur: 5 mg/dL — AB
Nitrite: NEGATIVE
Protein, ur: NEGATIVE mg/dL
Specific Gravity, Urine: 1.025 (ref 1.005–1.030)
pH: 6 (ref 5.0–8.0)

## 2023-04-05 LAB — COMPREHENSIVE METABOLIC PANEL
ALT: 13 U/L (ref 0–44)
AST: 17 U/L (ref 15–41)
Albumin: 3.4 g/dL — ABNORMAL LOW (ref 3.5–5.0)
Alkaline Phosphatase: 54 U/L (ref 38–126)
Anion gap: 8 (ref 5–15)
BUN: 10 mg/dL (ref 6–20)
CO2: 23 mmol/L (ref 22–32)
Calcium: 8.6 mg/dL — ABNORMAL LOW (ref 8.9–10.3)
Chloride: 104 mmol/L (ref 98–111)
Creatinine, Ser: 0.6 mg/dL (ref 0.44–1.00)
GFR, Estimated: >60 mL/min (ref >60)
Glucose, Bld: 81 mg/dL (ref 70–99)
Potassium: 3.6 mmol/L (ref 3.5–5.1)
Sodium: 135 mmol/L (ref 135–145)
Total Bilirubin: 0.8 mg/dL (ref 0.3–1.2)
Total Protein: 6.7 g/dL (ref 6.5–8.1)

## 2023-04-05 MED ORDER — ONDANSETRON HCL 8 MG PO TABS: 8.0000 mg | ORAL_TABLET | Freq: Three times a day (TID) | ORAL | 1 refills | Status: DC | PRN

## 2023-04-05 MED ORDER — SODIUM CHLORIDE 0.9 % IV SOLN
8.0000 mg | Freq: Once | INTRAVENOUS | Status: AC
  Administered 2023-04-05: 8 mg via INTRAVENOUS
  Filled 2023-04-05: qty 4

## 2023-04-05 MED ORDER — ACETAMINOPHEN 500 MG PO TABS
1000.0000 mg | ORAL_TABLET | Freq: Once | ORAL | Status: AC
  Administered 2023-04-05: 1000 mg via ORAL
  Filled 2023-04-05: qty 2

## 2023-04-05 MED ORDER — SCOPOLAMINE 1 MG/3DAYS TD PT72
1.0000 | MEDICATED_PATCH | Freq: Once | TRANSDERMAL | Status: DC
  Administered 2023-04-05: 1.5 mg via TRANSDERMAL
  Filled 2023-04-05: qty 1

## 2023-04-05 MED ORDER — LACTATED RINGERS IV BOLUS
1000.0000 mL | Freq: Once | INTRAVENOUS | Status: AC
  Administered 2023-04-05: 1000 mL via INTRAVENOUS

## 2023-04-05 MED ORDER — SCOPOLAMINE 1 MG/3DAYS TD PT72: 1.0000 | MEDICATED_PATCH | TRANSDERMAL | 1 refills | Status: DC

## 2023-04-05 NOTE — MAU Note (Signed)
.  Monica [name] is a 27 y.o. at [redacted]w[redacted]d here in MAU reporting: was prescribed phenergan that was working, but for the last 24 hours it has not been working. States she has thrown up many times in the last 24 hrs and has also had diarrhea for the last three days. Denies VB, but has had an increase in white clumpy discharge and vaginal itching after taking an antibiotic for a UTI. Also endorses a headache currently.   Pain score: 5 Vitals:   04/05/23 1201  BP: 109/74  Pulse: 74  Resp: 15  Temp: 98 F (36.7 C)  SpO2: 100%      Lab orders placed from triage:  UA

## 2023-04-05 NOTE — MAU Provider Note (Signed)
History     CSN: [id number]  Arrival date and time: 04/05/23 1152   None     Chief Complaint  Patient presents with   Emesis   Nausea   Diarrhea   HPI [name] is a 27 y.o. Female [id number] @ [redacted]w[redacted]d here in MAU with nausea and vomiting. Reports off and on symptoms since she found out she was pregnant. She was seen for the same complaints on 5/14 and was given phenergan. This worked initially and now is not working. She reports vomiting multiple times yesterday.   No abdominal pain no vaginal bleeding.   OB History     Gravida  5   Para  3   Term  2   Preterm  1   AB  1   Living  3      SAB  1   IAB  0   Ectopic  0   Multiple  0   Live Births  3           Past Medical History:  Diagnosis Date   Preterm labor     Past Surgical History:  Procedure Laterality Date   APPENDECTOMY      Family History  Problem Relation Age of Onset   Diabetes Maternal Grandfather    Healthy Mother    Healthy Father     Social History   Tobacco Use   Smoking status: Former    Types: Cigarettes    Quit date: 01/08/2020    Years since quitting: 3.2   Smokeless tobacco: Never  Vaping Use   Vaping Use: Never used  Substance Use Topics   Alcohol use: Not Currently    Comment: occasionally   Drug use: Never    Allergies: No Known Allergies  Medications Prior to Admission  Medication Sig Dispense Refill Last Dose   promethazine (PHENERGAN) 25 MG tablet Take 1 tablet (25 mg total) by mouth every 6 (six) hours as needed for nausea or vomiting. 30 tablet 3 04/05/2023   Results for orders placed or performed during the hospital encounter of 04/05/23 (from the past 48 hour(s))  Urinalysis, Routine w reflex microscopic -Urine, Clean Catch     Status: Abnormal   Collection Time: 04/05/23 12:13 PM  Result Value Ref Range   Color, Urine YELLOW YELLOW   APPearance HAZY (A) CLEAR   Specific Gravity, Urine 1.025 1.005 - 1.030   pH 6.0 5.0 - 8.0    Glucose, UA NEGATIVE NEGATIVE mg/dL   Hgb urine dipstick NEGATIVE NEGATIVE   Bilirubin Urine NEGATIVE NEGATIVE   Ketones, ur 5 (A) NEGATIVE mg/dL   Protein, ur NEGATIVE NEGATIVE mg/dL   Nitrite NEGATIVE NEGATIVE   Leukocytes,Ua TRACE (A) NEGATIVE   RBC / HPF 0-5 0 - 5 RBC/hpf   WBC, UA 0-5 0 - 5 WBC/hpf   Bacteria, UA NONE SEEN NONE SEEN   Squamous Epithelial / HPF 11-20 0 - 5 /HPF   Mucus PRESENT     Comment: Performed at [facility], [address] [place], [place] [address]  Comprehensive metabolic panel     Status: Abnormal   Collection Time: 04/05/23 12:56 PM  Result Value Ref Range   Sodium 135 135 - 145 mmol/L   Potassium 3.6 3.5 - 5.1 mmol/L   Chloride 104 98 - 111 mmol/L   CO2 23 22 - 32 mmol/L   Glucose, Bld 81 70 - 99 mg/dL    Comment: Glucose reference range applies only  to samples taken after fasting for at least 8 hours.   BUN 10 6 - 20 mg/dL   Creatinine, Ser 1.61 0.44 - 1.00 mg/dL   Calcium 8.6 (L) 8.9 - 10.3 mg/dL   Total Protein 6.7 6.5 - 8.1 g/dL   Albumin 3.4 (L) 3.5 - 5.0 g/dL   AST 17 15 - 41 U/L   ALT 13 0 - 44 U/L   Alkaline Phosphatase 54 38 - 126 U/L   Total Bilirubin 0.8 0.3 - 1.2 mg/dL   GFR, Estimated >09 >60 mL/min    Comment: (NOTE) Calculated using the CKD-EPI Creatinine Equation (2021)    Anion gap 8 5 - 15    Comment: Performed at [facility], [address] [place], [place] [address]     Review of Systems  Gastrointestinal:  Positive for nausea and vomiting. Negative for abdominal pain.  Genitourinary:  Negative for vaginal bleeding and vaginal discharge.   Physical Exam   Blood pressure 109/74, pulse 74, temperature 98 F (36.7 C), temperature source Oral, resp. rate 15, weight 65.5 kg, last menstrual period 01/27/2023, SpO2 100 %, currently breastfeeding.  Physical Exam Constitutional:      General: She is not in acute distress.    Appearance: Normal appearance. She is ill-appearing. She is not toxic-appearing.   HENT:     Head: Normocephalic.  Musculoskeletal:        General: Normal range of motion.  Skin:    Capillary Refill: Capillary refill takes more than 3 seconds.  Neurological:     Mental Status: She is alert and oriented to person, place, and time.  Psychiatric:        Behavior: Behavior normal.    MAU Course  Procedures  MDM UA shows mild dehydration Lr bolus x 1 with Zofran 8 mg IV Patient tolerating oral fluids.  CMP stable.   Assessment and Plan   A:  1. Nausea and vomiting in pregnancy   2. [redacted] weeks gestation of pregnancy      P:  Dc home Rx: Zofran, Scopolamine patch placed prior to DC Return to MAU if symptoms worsen Small frequent meals. Bland foods.   Duane Lope, NP 04/05/2023 5:09 PM

## 2023-04-05 NOTE — MAU Note (Signed)
Just finished abx for UTI, diarrhea started 3 days ago and started vomiting yesterday.

## 2023-05-05 ENCOUNTER — Telehealth: Payer: Self-pay

## 2023-05-05 NOTE — Progress Notes (Signed)
Called pt to begin New OB Intake visit. Patient states she attempted to cancel this visit by message. Does not want to reschedule this appointment or initial prenatal visit. Offered visit in future if desired.  Fleet Contras RN

## 2023-05-12 ENCOUNTER — Encounter: Payer: Self-pay | Admitting: Obstetrics & Gynecology

## 2023-06-27 ENCOUNTER — Emergency Department (HOSPITAL_COMMUNITY): Payer: Self-pay

## 2023-06-27 ENCOUNTER — Encounter (HOSPITAL_COMMUNITY): Payer: Self-pay

## 2023-06-27 ENCOUNTER — Emergency Department (HOSPITAL_COMMUNITY)
Admission: EM | Admit: 2023-06-27 | Discharge: 2023-06-27 | Disposition: A | Discharge status: Home / Self Care | Payer: Self-pay | Attending: Emergency Medicine | Admitting: Emergency Medicine

## 2023-06-27 ENCOUNTER — Other Ambulatory Visit: Payer: Self-pay

## 2023-06-27 DIAGNOSIS — S1093XA Contusion of unspecified part of neck, initial encounter: Secondary | ICD-10-CM | POA: Insufficient documentation

## 2023-06-27 DIAGNOSIS — F419 Anxiety disorder, unspecified: Secondary | ICD-10-CM | POA: Insufficient documentation

## 2023-06-27 DIAGNOSIS — R519 Headache, unspecified: Secondary | ICD-10-CM | POA: Insufficient documentation

## 2023-06-27 LAB — URINALYSIS, ROUTINE W REFLEX MICROSCOPIC
Glucose, UA: NEGATIVE mg/dL
Ketones, ur: NEGATIVE mg/dL
Leukocytes,Ua: NEGATIVE
Nitrite: NEGATIVE
Protein, ur: NEGATIVE mg/dL
Specific Gravity, Urine: 1.015 (ref 1.005–1.030)
pH: 5 (ref 5.0–8.0)

## 2023-06-27 LAB — PREGNANCY, URINE: Preg Test, Ur: NEGATIVE

## 2023-06-27 MED ORDER — NAPROXEN 500 MG PO TABS: 500.0000 mg | ORAL_TABLET | Freq: Two times a day (BID) | ORAL | 0 refills | Status: DC

## 2023-06-27 MED ORDER — METHOCARBAMOL 500 MG PO TABS: 1000.0000 mg | ORAL_TABLET | Freq: Four times a day (QID) | ORAL | 0 refills | Status: DC

## 2023-06-27 MED ORDER — METHOCARBAMOL 500 MG PO TABS
500.0000 mg | ORAL_TABLET | Freq: Once | ORAL | Status: AC
  Administered 2023-06-27: 500 mg via ORAL
  Filled 2023-06-27: qty 1

## 2023-06-27 MED ORDER — ACETAMINOPHEN 500 MG PO TABS
1000.0000 mg | ORAL_TABLET | Freq: Once | ORAL | Status: AC
  Administered 2023-06-27: 1000 mg via ORAL
  Filled 2023-06-27: qty 2

## 2023-06-27 MED ORDER — KETOROLAC TROMETHAMINE 60 MG/2ML IM SOLN
30.0000 mg | Freq: Once | INTRAMUSCULAR | Status: AC
  Administered 2023-06-27: 30 mg via INTRAMUSCULAR
  Filled 2023-06-27: qty 2
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
## 2024-01-20 ENCOUNTER — Encounter (HOSPITAL_COMMUNITY): Payer: Self-pay

## 2024-01-20 ENCOUNTER — Other Ambulatory Visit: Payer: Self-pay

## 2024-01-20 ENCOUNTER — Emergency Department (HOSPITAL_COMMUNITY)
Admission: EM | Admit: 2024-01-20 | Discharge: 2024-01-21 | Disposition: A | Discharge status: Home / Self Care | Payer: Self-pay | Attending: Emergency Medicine | Admitting: Emergency Medicine

## 2024-01-20 DIAGNOSIS — J101 Influenza due to other identified influenza virus with other respiratory manifestations: Secondary | ICD-10-CM | POA: Insufficient documentation

## 2024-01-20 DIAGNOSIS — Z79899 Other long term (current) drug therapy: Secondary | ICD-10-CM | POA: Insufficient documentation

## 2024-01-20 NOTE — ED Triage Notes (Signed)
 Pt reports chills, headache, body aches, congestion, cough; onset last night.

## 2024-01-21 LAB — RESP PANEL BY RT-PCR (RSV, FLU A&B, COVID)  RVPGX2
Influenza A by PCR: POSITIVE — AB
Influenza B by PCR: NEGATIVE
Resp Syncytial Virus by PCR: NEGATIVE
SARS Coronavirus 2 by RT PCR: NEGATIVE

## 2024-01-21 MED ORDER — ONDANSETRON 4 MG PO TBDP: 4.0000 mg | ORAL_TABLET | Freq: Three times a day (TID) | ORAL | 0 refills | Status: DC | PRN

## 2024-01-21 MED ORDER — ONDANSETRON 4 MG PO TBDP
4.0000 mg | ORAL_TABLET | Freq: Once | ORAL | Status: AC
  Administered 2024-01-21: 4 mg via ORAL
  Filled 2024-01-21: qty 1

## 2024-01-21 MED ORDER — IBUPROFEN 400 MG PO TABS
600.0000 mg | ORAL_TABLET | Freq: Once | ORAL | Status: AC
  Administered 2024-01-21: 600 mg via ORAL
  Filled 2024-01-21: qty 1

## 2024-01-21 NOTE — ED Provider Notes (Signed)
 [place] EMERGENCY DEPARTMENT AT [facility] Provider Note   CSN: [id number] Arrival date & time: 01/20/24  2314     History  Chief Complaint  Patient presents with   Generalized Body Aches    Monica [name] is a 28 y.o. female with hx of 48 hours of chills, subjective fever, myalgias, congestion, N/V with NBNB emesis and decreased PO intake. No known ill contacts. No medical dx, no medications daily. Tylenol at home for HA. Soreness in the ribs with coughing but no chest pain or SOB. Body aches since entering ED.   HPI     Home Medications Prior to Admission medications   Medication Sig Start Date End Date Taking? Authorizing Provider  ondansetron (ZOFRAN-ODT) 4 MG disintegrating tablet Take 1 tablet (4 mg total) by mouth every 8 (eight) hours as needed for nausea or vomiting. 01/21/24  Yes Chantalle Defilippo R, PA-C  methocarbamol (ROBAXIN) 500 MG tablet Take 2 tablets (1,000 mg total) by mouth 4 (four) times daily. 06/27/23   Renne Crigler, PA-C  naproxen (NAPROSYN) 500 MG tablet Take 1 tablet (500 mg total) by mouth 2 (two) times daily. 06/27/23   Renne Crigler, PA-C  promethazine (PHENERGAN) 25 MG tablet Take 1 tablet (25 mg total) by mouth every 6 (six) hours as needed for nausea or vomiting. 03/23/23   Thressa Sheller D, CNM  scopolamine (TRANSDERM-SCOP) 1 MG/3DAYS Place 1 patch (1.5 mg total) onto the skin every 3 (three) days. 04/05/23   Rasch, Harolyn Rutherford, NP      Allergies    Patient has no known allergies.    Review of Systems   Review of Systems  Constitutional:  Positive for activity change, appetite change, chills, fatigue and fever.  HENT:  Positive for congestion. Negative for sore throat.   Eyes:  Negative for photophobia and visual disturbance.  Respiratory:  Positive for cough. Negative for shortness of breath.   Cardiovascular: Negative.   Gastrointestinal:  Positive for abdominal pain, nausea and vomiting.  Musculoskeletal:   Positive for myalgias.  Skin: Negative.   Neurological:  Positive for headaches.    Physical Exam Updated Vital Signs BP 113/64   Pulse 99   Temp 98.4 F (36.9 C) (Oral)   Resp 16   Ht 5\' 3"  (1.6 m)   Wt 59 kg   LMP 01/27/2023 (Exact Date)   SpO2 100%   Breastfeeding No   BMI 23.03 kg/m  Physical Exam Vitals and nursing note reviewed.  Constitutional:      Appearance: She is not ill-appearing or toxic-appearing.  HENT:     Head: Normocephalic and atraumatic.     Nose: Rhinorrhea present. No congestion.     Mouth/Throat:     Mouth: Mucous membranes are moist.     Pharynx: Oropharynx is clear. Uvula midline. Posterior oropharyngeal erythema present. No oropharyngeal exudate.     Tonsils: No tonsillar exudate or tonsillar abscesses.  Eyes:     General:        Right eye: No discharge.        Left eye: No discharge.     Conjunctiva/sclera: Conjunctivae normal.  Neck:     Trachea: Phonation normal.  Cardiovascular:     Rate and Rhythm: Normal rate and regular rhythm.     Pulses: Normal pulses.     Heart sounds: Normal heart sounds. No murmur heard. Pulmonary:     Effort: Pulmonary effort is normal. No respiratory distress.     Breath sounds: Normal breath  sounds. No wheezing or rales.  Abdominal:     General: Bowel sounds are normal. There is no distension.     Palpations: Abdomen is soft.     Tenderness: There is no abdominal tenderness. There is no right CVA tenderness, left CVA tenderness, guarding or rebound.  Musculoskeletal:        General: No deformity.     Cervical back: Normal range of motion and neck supple.     Right lower leg: No edema.     Left lower leg: No edema.  Lymphadenopathy:     Cervical: No cervical adenopathy.  Skin:    General: Skin is warm and dry.     Capillary Refill: Capillary refill takes less than 2 seconds.  Neurological:     General: No focal deficit present.     Mental Status: She is alert and oriented to person, place, and time.  Mental status is at baseline.  Psychiatric:        Mood and Affect: Mood normal.     ED Results / Procedures / Treatments   Labs (all labs ordered are listed, but only abnormal results are displayed) Labs Reviewed  RESP PANEL BY RT-PCR (RSV, FLU A&B, COVID)  RVPGX2 - Abnormal; Notable for the following components:      Result Value   Influenza A by PCR POSITIVE (*)    All other components within normal limits    EKG None  Radiology No results found.  Procedures Procedures    Medications Ordered in ED Medications  ondansetron (ZOFRAN-ODT) disintegrating tablet 4 mg (4 mg Oral Given 01/21/24 0307)  ibuprofen (ADVIL) tablet 600 mg (600 mg Oral Given 01/21/24 0307)    ED Course/ Medical Decision Making/ A&P                                 Medical Decision Making 62 y/ o female with flu-like symptoms .  Afebrile, very mildly tachycardic on intake. Cardiopulmonary exams unremarkable, abdominal seems benign.  HEENT exam as above.  DDx includes not into influenza, COVID-19, other acute viral upper respiratory illness, gastroenteritis, sinusitis, pneumonia  Amount and/or Complexity of Data Reviewed Labs:     Details: RVP positive for influenza A.  Risk Prescription drug management.   Clinical patient most consistent with diagnosed influenza A infection.  Clinical concern for emergent underlying condition that would warrant further ED workup or inpatient management is exceedingly low.  I recommend supportive care.  Return precautions given.  Sharise  voiced understanding of her medical evaluation and treatment plan. Each of their questions answered to their expressed satisfaction.  Return precautions were given.  Patient is well-appearing, stable, and was discharged in good condition.  This chart was dictated using voice recognition software, Dragon. Despite the best efforts of this provider to proofread and correct errors, errors may still occur which can change  documentation meaning.         Final Clinical Impression(s) / ED Diagnoses Final diagnoses:  Influenza A    Rx / DC Orders ED Discharge Orders          Ordered    ondansetron (ZOFRAN-ODT) 4 MG disintegrating tablet  Every 8 hours PRN        01/21/24 0246              Oza Oberle, Eugene Gavia, PA-C 01/21/24 0320    Shon Baton, MD 01/21/24 2332

## 2024-01-21 NOTE — Discharge Instructions (Addendum)
 You are seen in the ER today for your flulike symptoms and were diagnosed with influenza A.  You may take ibuprofen and Tylenol as needed for your body aches and fever.  Please take the prescribed nausea medication as needed and return to the ER with any severe symptoms.

## 2024-10-29 ENCOUNTER — Encounter (HOSPITAL_COMMUNITY): Payer: Self-pay

## 2024-10-29 ENCOUNTER — Ambulatory Visit (HOSPITAL_COMMUNITY)
Admission: EM | Admit: 2024-10-29 | Discharge: 2024-10-29 | Disposition: A | Discharge status: Home / Self Care | Payer: Self-pay | Attending: Family Medicine | Admitting: Family Medicine

## 2024-10-29 DIAGNOSIS — J069 Acute upper respiratory infection, unspecified: Secondary | ICD-10-CM

## 2024-10-29 DIAGNOSIS — R051 Acute cough: Secondary | ICD-10-CM

## 2024-10-29 DIAGNOSIS — H9203 Otalgia, bilateral: Secondary | ICD-10-CM

## 2024-10-29 LAB — POCT URINE PREGNANCY: Preg Test, Ur: NEGATIVE

## 2024-10-29 LAB — POC COVID19/FLU A&B COMBO
Covid Antigen, POC: NEGATIVE
Influenza A Antigen, POC: NEGATIVE
Influenza B Antigen, POC: NEGATIVE

## 2024-10-29 MED ORDER — PROMETHAZINE-DM 6.25-15 MG/5ML PO SYRP: 5.0000 mL | ORAL_SOLUTION | Freq: Three times a day (TID) | ORAL | 0 refills | Status: DC | PRN

## 2024-10-29 NOTE — Discharge Instructions (Signed)
 It was nice seeing you. I am sorry about your symptoms. This is likely due to a common viral illness. Your COVID and flu tests are negative No signs of bacterial ear infection Use Tylenol  or Ibuprofen  as needed for pain and fever Use Promethazine  DM prn for cough and congestion Return precautions discussed.

## 2024-10-29 NOTE — ED Triage Notes (Signed)
 Pt c/o cough x1wk. C/o bilateral ear pain, headache, and no taste or smell since yesterday. Took day/nyquil and tylenol  with no relief.

## 2024-10-29 NOTE — ED Provider Notes (Signed)
 " [facility]    CSN: [id number] Arrival date & time: 10/29/24  1343      History   Chief Complaint Chief Complaint  Patient presents with   Cough    HPI Nyajah Hyson is a 28 y.o. female.   The history is provided by the patient. No language interpreter was used.  Cough Sputum characteristics: Greenish yellow mucus. Onset quality:  Gradual Duration:  1 week Timing:  Constant Progression:  Unchanged (Worse at night) Chronicity:  New Smoker: no   Context: not sick contacts   Relieved by:  Nothing Worsened by:  Nothing Ineffective treatments: Nyquil and Dayquil. Tylenol  for headache. Associated symptoms: ear fullness, headaches and sinus congestion   Associated symptoms: no chest pain, no ear pain, no fever, no shortness of breath and no sore throat   Associated symptoms comment:  Hearing sounds muffled. 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   Review of Systems Review of Systems  Constitutional:  Negative for fever.  HENT:  Negative for ear pain and sore throat.   Respiratory:   Positive for cough. Negative for shortness of breath.   Cardiovascular:  Negative for chest pain.  Neurological:  Positive for headaches.  All other systems reviewed and are negative.    Physical Exam Triage Vital Signs ED Triage Vitals  Encounter Vitals Group     BP 10/29/24 1519 120/75     Girls Systolic BP Percentile --      Girls Diastolic BP Percentile --      Boys Systolic BP Percentile --      Boys Diastolic BP Percentile --      Pulse Rate 10/29/24 1519 80     Resp 10/29/24 1519 16     Temp 10/29/24 1519 97.8 F (36.6 C)     Temp Source 10/29/24 1519 Oral     SpO2 10/29/24 1519 96 %     Weight --      Height --      Head Circumference --      Peak Flow --      Pain Score 10/29/24 1517 7     Pain Loc --      Pain Education --      Exclude from Growth Chart --    No  data found.  Updated Vital Signs BP 120/75 (BP Location: Left Arm)   Pulse 80   Temp 97.8 F (36.6 C) (Oral)   Resp 16   LMP 08/29/2024 (Approximate)   SpO2 96%   Visual Acuity Right Eye Distance:   Left Eye Distance:   Bilateral Distance:    Right Eye Near:   Left Eye Near:    Bilateral Near:     Physical Exam Vitals and nursing note reviewed.  Constitutional:      Appearance: She is not toxic-appearing.  HENT:     Right Ear: Tympanic membrane, ear canal and external ear normal. There is no impacted cerumen.     Left Ear: Tympanic membrane, ear canal and external ear normal. There is no impacted cerumen.     Mouth/Throat:     Mouth: Mucous membranes are moist.     Pharynx: No oropharyngeal exudate or posterior oropharyngeal erythema.  Eyes:     Conjunctiva/sclera: Conjunctivae normal.  Cardiovascular:     Rate and Rhythm: Normal rate and regular rhythm.     Heart sounds: Normal heart sounds. No murmur heard. Pulmonary:     Effort: Pulmonary effort is normal. No respiratory distress.     Breath sounds: Normal breath sounds. No wheezing or rhonchi.      UC Treatments / Results   Labs (all labs ordered are listed, but only abnormal results are displayed) Labs Reviewed  POC COVID19/FLU A&B COMBO - Normal  POCT URINE PREGNANCY - Normal    EKG   Radiology No results found.  Procedures Procedures (including critical care time)  Medications Ordered in UC Medications - No data to display  Initial Impression / Assessment and Plan / UC Course  I have reviewed the triage vital signs and the nursing notes.  Pertinent labs & imaging results that were available during my care of the patient were reviewed by me and considered in my medical decision making (see chart for details).  Clinical Course as of 10/29/24 1546  Sun Oct 29, 2024  1546 Viral illness Negative COVID and flu tests Likely common viral illness No signs of bacterial ear infection Use Tylenol  or Ibuprofen  as needed for pain and fever Use Promethazine  DM prn for cough and congestion Return precautions discussed.  [KE]    Clinical Course User Index [KE] Anders Otto DASEN, MD     Final Clinical Impressions(s) / UC Diagnoses   Final diagnoses:  None   Discharge Instructions   None    ED Prescriptions   None    PDMP not reviewed this encounter.     [1]  Social History Tobacco Use   Smoking status: Former    Current packs/day: 0.00    Types: Cigarettes    Quit date: 01/08/2020    Years since quitting: 4.8   Smokeless tobacco: Never  Vaping Use   Vaping status: Never Used  Substance Use Topics   Alcohol use: Not Currently    Comment: occasionally   Drug use: Never     Anders Otto DASEN, MD 10/29/24 1546  "

## 2024-11-01 ENCOUNTER — Encounter (HOSPITAL_COMMUNITY): Payer: Self-pay

## 2024-11-01 ENCOUNTER — Ambulatory Visit (HOSPITAL_COMMUNITY)
Admission: RE | Admit: 2024-11-01 | Discharge: 2024-11-01 | Disposition: A | Discharge status: Home / Self Care | Payer: Self-pay | Source: Ambulatory Visit | Attending: Family Medicine | Admitting: Family Medicine

## 2024-11-01 VITALS — BP 120/81 | HR 62 | Temp 98.3°F | Resp 14

## 2024-11-01 DIAGNOSIS — N898 Other specified noninflammatory disorders of vagina: Secondary | ICD-10-CM | POA: Insufficient documentation

## 2024-11-01 MED ORDER — FLUCONAZOLE 150 MG PO TABS: 150.0000 mg | ORAL_TABLET | Freq: Every day | ORAL | 0 refills | Status: AC

## 2024-11-01 NOTE — ED Provider Notes (Signed)
 " [facility]    CSN: [id number] Arrival date & time: 11/01/24  1232      History   Chief Complaint Chief Complaint  Patient presents with   Vaginal Itching    Entered by patient   Vaginal Discharge    HPI Monica [name] is a 28 y.o. female.   This 28 year old female is being seen for complaints of vaginal irritation, itching, burning with intercourse.  She also reports vaginal discharge similar to previous yeast infections.  She reports symptoms have been ongoing for 3 days.  She denies chest pain, shortness of breath.  She denies abdominal pain, nausea, vomiting, diarrhea.  She reports some pelvic cramping.  She denies urinary symptoms including dysuria, urgency, frequency.   Vaginal Itching Pertinent negatives include no chest pain, no abdominal pain and no shortness of breath.  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 Authorizing Provider  promethazine -dextromethorphan (PROMETHAZINE -DM) 6.25-15 MG/5ML syrup Take 5 mLs by mouth every 8 (eight) hours as needed for cough. 10/29/24   Anders Otto DASEN, MD    Family History Family History  Problem Relation Age of  Onset   Diabetes Maternal Grandfather    Healthy Mother    Healthy Father     Social History Social History[1]   Allergies   Patient has no known allergies.   Review of Systems Review of Systems  Constitutional:  Negative for chills and fever.  HENT:  Negative for mouth sores.   Respiratory:  Negative for shortness of breath.   Cardiovascular:  Negative for chest pain.  Gastrointestinal:  Negative for abdominal pain, nausea and vomiting.  Genitourinary:  Positive for pelvic pain (Mild cramping.), vaginal discharge and vaginal pain (With intercourse). Negative for difficulty urinating, dysuria, frequency, genital sores, urgency and vaginal bleeding.  All other systems reviewed and are negative.    Physical Exam Triage Vital Signs ED Triage Vitals [11/01/24 1321]  Encounter Vitals Group     BP 120/81     Girls Systolic BP Percentile      Girls Diastolic BP Percentile      Boys Systolic BP Percentile      Boys Diastolic BP Percentile      Pulse Rate 62     Resp 14     Temp 98.3 F (36.8 C)     Temp Source Oral     SpO2 97 %     Weight  Height      Head Circumference      Peak Flow      Pain Score      Pain Loc      Pain Education      Exclude from Growth Chart    No data found.  Updated Vital Signs BP 120/81 (BP Location: Right Arm)   Pulse 62   Temp 98.3 F (36.8 C) (Oral)   Resp 14   LMP 10/07/2024 (Approximate)   SpO2 97%   Visual Acuity Right Eye Distance:   Left Eye Distance:   Bilateral Distance:    Right Eye Near:   Left Eye Near:    Bilateral Near:     Physical Exam Vitals and nursing note reviewed.  Constitutional:      General: She is not in acute distress.    Appearance: She is well-developed. She is not ill-appearing or toxic-appearing.     Comments: Pleasant female appearing stated age found sitting in chair in no acute distress.  HENT:     Head: Normocephalic and atraumatic.  Eyes:     Conjunctiva/sclera: Conjunctivae  normal.  Cardiovascular:     Rate and Rhythm: Normal rate and regular rhythm.     Heart sounds: Normal heart sounds. No murmur heard. Pulmonary:     Effort: Pulmonary effort is normal. No respiratory distress.     Breath sounds: Normal breath sounds.  Abdominal:     General: Bowel sounds are normal.     Palpations: Abdomen is soft.     Tenderness: There is no abdominal tenderness.  Skin:    General: Skin is warm and dry.  Neurological:     Mental Status: She is alert.  Psychiatric:        Mood and Affect: Mood normal.      UC Treatments / Results  Labs (all labs ordered are listed, but only abnormal results are displayed) Labs Reviewed  CERVICOVAGINAL ANCILLARY ONLY    EKG   Radiology No results found.  Procedures Procedures (including critical care time)  Medications Ordered in UC Medications - No data to display  Initial Impression / Assessment and Plan / UC Course  I have reviewed the triage vital signs and the nursing notes.  Pertinent labs & imaging results that were available during my care of the patient were reviewed by me and considered in my medical decision making (see chart for details).     Vitals and triage reviewed, patient is hemodynamically stable.  Presentation consistent with vaginal candidiasis.  Cervicovaginal swab collected.  She declines HIV and syphilis testing at this time.  Staff will contact if results are positive to initiate appropriate treatment.  She is given a prescription for Diflucan .  Advised to avoid intercourse until results of STI screening have resulted.  Plan of care, follow-up care, return precautions given, no questions at this time. Final Clinical Impressions(s) / UC Diagnoses   Final diagnoses:  None   Discharge Instructions   None    ED Prescriptions   None    PDMP not reviewed this encounter.    [1]  Social History Tobacco Use   Smoking status: Former    Current packs/day: 0.00    Types: Cigarettes     Quit date: 01/08/2020    Years since quitting: 4.8   Smokeless tobacco: Never  Vaping Use   Vaping status: Former  Substance Use Topics   Alcohol use: Not Currently    Comment: occasionally   Drug use: Never  Lennice Jon BROCKS, FNP 11/01/24 [phone]  "

## 2024-11-01 NOTE — Discharge Instructions (Addendum)
 You have been prescribed Diflucan  for yeast infection.    Take 1 tablet once.    STD testing pending, this will take 2-3 days to result. We will only call you if your testing is positive for any infection(s) and we will provide treatment.  Avoid sexual intercourse until your STD results come back.  If any of your STD results are positive, you will need to avoid sexual intercourse for 7 days while you are being treated to prevent spread of STD.  Condom use is the best way to prevent spread of STDs. Notify partner(s) of any positive results.  Return to urgent care as needed.

## 2024-11-01 NOTE — ED Triage Notes (Signed)
 Patient reports that she has vaginal irritation, itching, and burning when she has sex. Patient also reports either  a white clumpy or yellow clumpy vaginal discharge. Has hd symptoms x 3 days.

## 2024-11-03 ENCOUNTER — Telehealth (HOSPITAL_COMMUNITY): Payer: Self-pay | Admitting: *Deleted

## 2024-11-03 ENCOUNTER — Ambulatory Visit (HOSPITAL_COMMUNITY): Payer: Self-pay

## 2024-11-03 LAB — CERVICOVAGINAL ANCILLARY ONLY
Bacterial Vaginitis (gardnerella): POSITIVE — AB
Candida Glabrata: NEGATIVE
Candida Vaginitis: POSITIVE — AB
Chlamydia: NEGATIVE
Comment: NEGATIVE
Comment: NEGATIVE
Comment: NEGATIVE
Comment: NEGATIVE
Comment: NEGATIVE
Comment: NORMAL
Neisseria Gonorrhea: NEGATIVE
Trichomonas: NEGATIVE

## 2024-11-03 MED ORDER — FLUCONAZOLE 150 MG PO TABS: 150.0000 mg | ORAL_TABLET | Freq: Every day | ORAL | 0 refills | Status: DC

## 2024-11-03 MED ORDER — METRONIDAZOLE 500 MG PO TABS: 500.0000 mg | ORAL_TABLET | Freq: Two times a day (BID) | ORAL | 0 refills | Status: AC

## 2024-11-03 NOTE — Telephone Encounter (Signed)
 Rx needs resent

## 2024-12-15 ENCOUNTER — Other Ambulatory Visit: Payer: Self-pay

## 2024-12-15 ENCOUNTER — Encounter (HOSPITAL_COMMUNITY): Payer: Self-pay | Admitting: *Deleted

## 2024-12-15 ENCOUNTER — Inpatient Hospital Stay (HOSPITAL_COMMUNITY): Payer: Self-pay

## 2024-12-15 ENCOUNTER — Inpatient Hospital Stay (HOSPITAL_COMMUNITY)
Admission: AD | Admit: 2024-12-15 | Discharge: 2024-12-15 | Disposition: A | Discharge status: Home / Self Care | Payer: Self-pay | Source: Home / Self Care | Attending: Obstetrics & Gynecology | Admitting: Obstetrics & Gynecology

## 2024-12-15 DIAGNOSIS — K5909 Other constipation: Secondary | ICD-10-CM

## 2024-12-15 DIAGNOSIS — O26891 Other specified pregnancy related conditions, first trimester: Secondary | ICD-10-CM

## 2024-12-15 DIAGNOSIS — O219 Vomiting of pregnancy, unspecified: Secondary | ICD-10-CM

## 2024-12-15 DIAGNOSIS — Z3A01 Less than 8 weeks gestation of pregnancy: Secondary | ICD-10-CM

## 2024-12-15 DIAGNOSIS — O200 Threatened abortion: Secondary | ICD-10-CM

## 2024-12-15 DIAGNOSIS — N898 Other specified noninflammatory disorders of vagina: Secondary | ICD-10-CM

## 2024-12-15 LAB — COMPREHENSIVE METABOLIC PANEL WITH GFR
ALT: 16 U/L (ref 0–44)
AST: 18 U/L (ref 15–41)
Albumin: 4.4 g/dL (ref 3.5–5.0)
Alkaline Phosphatase: 55 U/L (ref 38–126)
Anion gap: 11 (ref 5–15)
BUN: 15 mg/dL (ref 6–20)
CO2: 23 mmol/L (ref 22–32)
Calcium: 9.3 mg/dL (ref 8.9–10.3)
Chloride: 101 mmol/L (ref 98–111)
Creatinine, Ser: 0.68 mg/dL (ref 0.44–1.00)
GFR, Estimated: >60 mL/min
Glucose, Bld: 86 mg/dL (ref 70–99)
Potassium: 3.9 mmol/L (ref 3.5–5.1)
Sodium: 135 mmol/L (ref 135–145)
Total Bilirubin: 0.4 mg/dL (ref 0.0–1.2)
Total Protein: 7.3 g/dL (ref 6.5–8.1)

## 2024-12-15 LAB — CBC
HCT: 36.2 % (ref 36.0–46.0)
Hemoglobin: 12.3 g/dL (ref 12.0–15.0)
MCH: 28.5 pg (ref 26.0–34.0)
MCHC: 34 g/dL (ref 30.0–36.0)
MCV: 83.8 fL (ref 80.0–100.0)
Platelets: 268 10*3/uL (ref 150–400)
RBC: 4.32 MIL/uL (ref 3.87–5.11)
RDW: 12.3 % (ref 11.5–15.5)
WBC: 9.4 10*3/uL (ref 4.0–10.5)
nRBC: 0 % (ref 0.0–0.2)

## 2024-12-15 LAB — POCT PREGNANCY, URINE: Preg Test, Ur: POSITIVE — AB

## 2024-12-15 LAB — URINALYSIS, ROUTINE W REFLEX MICROSCOPIC
Bilirubin Urine: NEGATIVE
Glucose, UA: NEGATIVE mg/dL
Hgb urine dipstick: NEGATIVE
Ketones, ur: 5 mg/dL — AB
Leukocytes,Ua: NEGATIVE
Nitrite: NEGATIVE
Protein, ur: NEGATIVE mg/dL
Specific Gravity, Urine: 1.029 (ref 1.005–1.030)
pH: 5 (ref 5.0–8.0)

## 2024-12-15 LAB — WET PREP, GENITAL
Clue Cells Wet Prep HPF POC: NONE SEEN
Sperm: NONE SEEN
Trich, Wet Prep: NONE SEEN
WBC, Wet Prep HPF POC: >=10 — AB
Yeast Wet Prep HPF POC: NONE SEEN

## 2024-12-15 LAB — ABO/RH: ABO/RH(D): B POS

## 2024-12-15 LAB — HCG, QUANTITATIVE, PREGNANCY: hCG, Beta Chain, Quant, S: 38133 m[IU]/mL — ABNORMAL HIGH

## 2024-12-15 MED ORDER — DOCUSATE SODIUM 100 MG PO CAPS: 100.0000 mg | ORAL_CAPSULE | Freq: Two times a day (BID) | ORAL | 2 refills | Status: AC | PRN

## 2024-12-15 MED ORDER — PROMETHAZINE HCL 25 MG PO TABS: 25.0000 mg | ORAL_TABLET | Freq: Four times a day (QID) | ORAL | 0 refills | Status: AC | PRN

## 2024-12-15 NOTE — MAU Provider Note (Signed)
 [facility] Attending MAU Note  Chief Complaint: Abdominal Pain    None     SUBJECTIVE Monica [name] is a 29 y.o. [phone] at [redacted]w[redacted]d by LMP who presents with RLQ pain, cramping, bleeding(spotting) 5 days ago, now gone. Also with constipation and incomplete emptying. Having a lot of nausea.  Past Medical History:  Diagnosis Date   Preterm labor    OB History  Gravida Para Term Preterm AB Living  6 3 2 1 1 3   SAB IAB Ectopic Multiple Live Births  1 0 0 0 3    # Outcome Date GA Lbr Len/2nd Weight Sex Type Anes PTL Lv  6 Current           5 Term 07/07/21 [redacted]w[redacted]d 04:41 2880 g F Vag-Spont None  LIV  4 SAB 2021 [redacted]w[redacted]d         3 Preterm 10/12/18 [redacted]w[redacted]d 15:54 / 00:01 2560 g M Vag-Spont None  LIV     Birth Comments: baby born with gastroschesis  2 Term 10/22/16 [redacted]w[redacted]d 16:05 2920 g M Vag-Spont None  LIV     Birth Comments: wnl  1 Gravida            Past Surgical History:  Procedure Laterality Date   APPENDECTOMY     Social History   Socioeconomic History   Marital status: Single    Spouse name: Not on file   Number of children: Not on file   Years of education: Not on file   Highest education level: Not on file  Occupational History   Not on file  Tobacco Use   Smoking status: Former    Current packs/day: 0.00    Types: Cigarettes    Quit date: 01/08/2020    Years since quitting: 4.9   Smokeless tobacco: Never  Vaping Use   Vaping status: Former  Substance and Sexual Activity   Alcohol use: Not Currently    Comment: occasionally   Drug use: Never   Sexual activity: Yes    Birth control/protection: Condom  Other Topics Concern   Not on file  Social History Narrative   11th grade   Social Drivers of Health   Tobacco Use: Medium Risk (11/01/2024)   Patient History    Smoking Tobacco Use: Former    Smokeless Tobacco Use: Never    Passive Exposure: Not on Actuary Strain: Not on file  Food Insecurity: Not on file   Transportation Needs: Not on file  Physical Activity: Not on file  Stress: Not on file  Social Connections: Not on file  Intimate Partner Violence: Not on file  Depression (PHQ2-9): Not on file  Alcohol Screen: Not on file  Housing: Not on file  Utilities: Not on file  Health Literacy: Not on file   Medications Ordered Prior to Encounter[1] Allergies[2]  ROS: Pertinent items in HPI  OBJECTIVE BP (!) 106/55 (BP Location: Right Arm)   Pulse 60   Temp 98.4 F (36.9 C) (Oral)   Resp 18   Ht 5' 3 (1.6 m)   Wt 65.8 kg   LMP 11/03/2024   SpO2 100%   BMI 25.69 kg/m  CONSTITUTIONAL: Well-developed, well-nourished female in no acute distress.  HENT:  Normocephalic, atraumatic, External right and left ear normal. Oropharynx is clear and moist EYES: Conjunctivae and EOM are normal. No scleral icterus.  NECK: Normal range of motion, supple, no masses.  Normal thyroid.  SKIN: Skin is warm and dry. No rash noted. Not  diaphoretic. No erythema. No pallor. NEUROLGIC: Alert and oriented to person, place, and time.  CARDIOVASCULAR: Normal heart rate noted RESPIRATORY: Effort normal ABDOMEN: Soft, normal bowel sounds, no distention noted. RLQ tenderness, no rebound or guarding.    LAB RESULTS Results for orders placed or performed during the hospital encounter of 12/15/24 (from the past 48 hours)  Urinalysis, Routine w reflex microscopic -Urine, Clean Catch     Status: Abnormal   Collection Time: 12/15/24  4:03 PM  Result Value Ref Range   Color, Urine YELLOW YELLOW   APPearance HAZY (A) CLEAR   Specific Gravity, Urine 1.029 1.005 - 1.030   pH 5.0 5.0 - 8.0   Glucose, UA NEGATIVE NEGATIVE mg/dL   Hgb urine dipstick NEGATIVE NEGATIVE   Bilirubin Urine NEGATIVE NEGATIVE   Ketones, ur 5 (A) NEGATIVE mg/dL   Protein, ur NEGATIVE NEGATIVE mg/dL   Nitrite NEGATIVE NEGATIVE   Leukocytes,Ua NEGATIVE NEGATIVE    Comment: Performed at [facility], [address], [place],  [place] [address]  Wet prep, genital     Status: Abnormal   Collection Time: 12/15/24  4:03 PM   Specimen: PATH Cytology Cervicovaginal Ancillary Only  Result Value Ref Range   Yeast Wet Prep HPF POC NONE SEEN NONE SEEN   Trich, Wet Prep NONE SEEN NONE SEEN   Clue Cells Wet Prep HPF POC NONE SEEN NONE SEEN   WBC, Wet Prep HPF POC >=10 (A) <10   Sperm NONE SEEN     Comment: Performed at [facility], [address] [place], [place] [address]  Pregnancy, urine POC     Status: Abnormal   Collection Time: 12/15/24  4:04 PM  Result Value Ref Range   Preg Test, Ur POSITIVE (A) NEGATIVE    Comment:        THE SENSITIVITY OF THIS METHODOLOGY IS >20 mIU/mL.   CBC     Status: None   Collection Time: 12/15/24  4:34 PM  Result Value Ref Range   WBC 9.4 4.0 - 10.5 K/uL   RBC 4.32 3.87 - 5.11 MIL/uL   Hemoglobin 12.3 12.0 - 15.0 g/dL   HCT 63.7 63.9 - 53.9 %   MCV 83.8 80.0 - 100.0 fL   MCH 28.5 26.0 - 34.0 pg   MCHC 34.0 30.0 - 36.0 g/dL   RDW 87.6 88.4 - 84.4 %   Platelets 268 150 - 400 K/uL   nRBC 0.0 0.0 - 0.2 %    Comment: Performed at [facility], [address], [place], [place] [address]  ABO/Rh     Status: None   Collection Time: 12/15/24  4:34 PM  Result Value Ref Range   ABO/RH(D)      B POS Performed at [facility], [address], [place], [place] [address]   hCG, quantitative, pregnancy     Status: Abnormal   Collection Time: 12/15/24  4:34 PM  Result Value Ref Range   hCG, Beta Chain, Quant, S 38,133 (H) <5 mIU/mL    Comment:          GEST. AGE      CONC.  (mIU/mL)   <=1 WEEK        5 - 50     2 WEEKS       50 - 500     3 WEEKS       100 - 10,000     4 WEEKS     1,000 -  30,000     5 WEEKS     3,500 - 115,000   6-8 WEEKS     12,000 - 270,000    12 WEEKS     15,000 - 220,000        FEMALE AND NON-PREGNANT FEMALE:     LESS THAN 5 mIU/mL Performed at [facility], [address], [place], [place] [address]   Comprehensive metabolic panel with GFR      Status: None   Collection Time: 12/15/24  4:34 PM  Result Value Ref Range   Sodium 135 135 - 145 mmol/L   Potassium 3.9 3.5 - 5.1 mmol/L   Chloride 101 98 - 111 mmol/L   CO2 23 22 - 32 mmol/L   Glucose, Bld 86 70 - 99 mg/dL    Comment: Glucose reference range applies only to samples taken after fasting for at least 8 hours.   BUN 15 6 - 20 mg/dL   Creatinine, Ser 9.31 0.44 - 1.00 mg/dL   Calcium  9.3 8.9 - 10.3 mg/dL   Total Protein 7.3 6.5 - 8.1 g/dL   Albumin 4.4 3.5 - 5.0 g/dL   AST 18 15 - 41 U/L   ALT 16 0 - 44 U/L   Alkaline Phosphatase 55 38 - 126 U/L   Total Bilirubin 0.4 0.0 - 1.2 mg/dL   GFR, Estimated >39 >39 mL/min    Comment: (NOTE) Calculated using the CKD-EPI Creatinine Equation (2021)    Anion gap 11 5 - 15    Comment: Performed at [facility], [address], [place], [place] [address]    IMAGING US  MAINE Comp Less 14 Wks Result Date: 12/15/2024 CLINICAL DATA:  Abdominal pain x1 week. EXAM: OBSTETRIC <14 WK ULTRASOUND TECHNIQUE: Transabdominal ultrasound was performed for evaluation of the gestation as well as the maternal uterus and adnexal regions. COMPARISON:  Mar 23, 2023 FINDINGS: Intrauterine gestational sac: Single Yolk sac:  Visualized. Embryo:  Visualized. Cardiac Activity: Visualized. Heart Rate: 118 bpm CRL:   5.5 mm   6 w 2 d                  US  EDC: August 08, 2025 Subchorionic hemorrhage:  Small (0.6 cm x 0.8 cm x 0.6 cm) Maternal uterus/adnexae: The right ovary measures 2.0 cm x 1.7 cm x 1.9 cm and is normal in appearance. The left ovary measures 1.7 cm x 1.7 cm x 1.7 cm and is normal in appearance. No pelvic free fluid is seen. IMPRESSION: 1. Single, viable intrauterine pregnancy at approximately 6 weeks and 2 days gestation by ultrasound evaluation. 2. Small subchorionic hemorrhage. Electronically Signed   By: Suzen Dials M.D.   On: 12/15/2024 18:32    MAU COURSE PUL IUP confirmed No WBC--r/o appy Wet prep negative  ASSESSMENT 1.  Abdominal pain during pregnancy in first trimester   2. [redacted] weeks gestation of pregnancy   3. Vaginal discharge   4. Other constipation     PLAN Discharge home Rx for phenergan  Rx for colace F/u in office for GC/Chlam/trich testing Return precautions  Follow-up Information     [facility] Follow up.   Specialty: Obstetrics and Gynecology Contact information: [address] [place]  [address] [phone]               Allergies as of 12/15/2024   No Known Allergies      Medication List  STOP taking these medications    fluconazole  150 MG tablet Commonly known as: DIFLUCAN    promethazine -dextromethorphan 6.25-15 MG/5ML syrup Commonly known as: PROMETHAZINE -DM       TAKE these medications    docusate sodium  100 MG capsule Commonly known as: COLACE Take 1 capsule (100 mg total) by mouth 2 (two) times daily as needed.   promethazine  25 MG tablet Commonly known as: PHENERGAN  Take 1 tablet (25 mg total) by mouth every 6 (six) hours as needed for nausea or vomiting.        Evaluation does not show pathology that would require ongoing emergent intervention or inpatient treatment. Patient is hemodynamically stable and mentating appropriately. Discussed findings and plan with patient, who agrees with care plan. All questions answered. Return precautions discussed and outpatient follow up recommendations given.  Fredirick Glenys RAMAN, MD 12/15/2024 6:48 PM     [1]  No current facility-administered medications on file prior to encounter.   Current Outpatient Medications on File Prior to Encounter  Medication Sig Dispense Refill   fluconazole  (DIFLUCAN ) 150 MG tablet Take 1 tablet (150 mg total) by mouth daily. 1 tablet 0   promethazine -dextromethorphan (PROMETHAZINE -DM) 6.25-15 MG/5ML syrup Take 5 mLs by mouth every 8 (eight) hours as needed for cough. 118 mL 0  [2] No Known Allergies

## 2024-12-15 NOTE — MAU Note (Signed)
 Monica [name] is a 29 y.o. at Unknown here in MAU reporting: she's having lower abdominal cramping that's mostly in the RLQ that began 1 week ago.  Denies current VB but had spotting 5 days ago, reports has yellow discharge that's malodorous.  States she thinks she's constipated, hurts when having BM.  Reports last BM was today.  LMP: 11/03/2024 Onset of complaint: 1 week ago Pain score: 6 Vitals:   12/15/24 1627  BP: (!) 106/55  Pulse: 60  Resp: 18  Temp: 98.4 F (36.9 C)  SpO2: 100%     FHT: NA  Lab orders placed from triage: UPT & UA
